# Patient Record
Sex: Female | Born: 1995 | Race: White | Hispanic: No | Marital: Single | Smoking: Never smoker
Health system: Southern US, Community
[De-identification: ages and names within clinical notes are randomized; demographics above are authoritative.]

## PROBLEM LIST (undated history)

## (undated) ENCOUNTER — Inpatient Hospital Stay (HOSPITAL_COMMUNITY): Payer: Self-pay

## (undated) DIAGNOSIS — J45909 Unspecified asthma, uncomplicated: Secondary | ICD-10-CM

## (undated) DIAGNOSIS — F419 Anxiety disorder, unspecified: Secondary | ICD-10-CM

## (undated) DIAGNOSIS — E039 Hypothyroidism, unspecified: Secondary | ICD-10-CM

## (undated) DIAGNOSIS — F32A Depression, unspecified: Secondary | ICD-10-CM

## (undated) DIAGNOSIS — F329 Major depressive disorder, single episode, unspecified: Secondary | ICD-10-CM

## (undated) DIAGNOSIS — F4310 Post-traumatic stress disorder, unspecified: Secondary | ICD-10-CM

## (undated) HISTORY — DX: Unspecified asthma, uncomplicated: J45.909

## (undated) HISTORY — PX: HIP SURGERY: SHX245

## (undated) HISTORY — DX: Anxiety disorder, unspecified: F41.9

## (undated) HISTORY — DX: Post-traumatic stress disorder, unspecified: F43.10

## (undated) HISTORY — PX: MOUTH SURGERY: SHX715

## (undated) HISTORY — PX: FEMUR SURGERY: SHX943

---
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
## 2015-06-09 ENCOUNTER — Emergency Department (HOSPITAL_COMMUNITY)
Admission: EM | Admit: 2015-06-09 | Discharge: 2015-06-09 | Disposition: A | Discharge status: Home / Self Care | Payer: Self-pay | Attending: Emergency Medicine | Admitting: Emergency Medicine

## 2015-06-09 ENCOUNTER — Encounter (HOSPITAL_COMMUNITY): Payer: Self-pay | Admitting: Emergency Medicine

## 2015-06-09 DIAGNOSIS — R1110 Vomiting, unspecified: Secondary | ICD-10-CM | POA: Insufficient documentation

## 2015-06-09 DIAGNOSIS — R51 Headache: Secondary | ICD-10-CM | POA: Insufficient documentation

## 2015-06-09 DIAGNOSIS — R109 Unspecified abdominal pain: Secondary | ICD-10-CM | POA: Insufficient documentation

## 2015-06-09 DIAGNOSIS — Z720 Tobacco use: Secondary | ICD-10-CM | POA: Insufficient documentation

## 2015-06-09 DIAGNOSIS — Z3202 Encounter for pregnancy test, result negative: Secondary | ICD-10-CM | POA: Insufficient documentation

## 2015-06-09 DIAGNOSIS — R519 Headache, unspecified: Secondary | ICD-10-CM

## 2015-06-09 LAB — COMPREHENSIVE METABOLIC PANEL
ALT: 14 U/L (ref 14–54)
ANION GAP: 6 (ref 5–15)
AST: 20 U/L (ref 15–41)
Albumin: 3.9 g/dL (ref 3.5–5.0)
Alkaline Phosphatase: 80 U/L (ref 38–126)
BILIRUBIN TOTAL: 0.5 mg/dL (ref 0.3–1.2)
BUN: 5 mg/dL — AB (ref 6–20)
CHLORIDE: 107 mmol/L (ref 101–111)
CO2: 22 mmol/L (ref 22–32)
Calcium: 9 mg/dL (ref 8.9–10.3)
Creatinine, Ser: 0.67 mg/dL (ref 0.44–1.00)
Glucose, Bld: 97 mg/dL (ref 65–99)
POTASSIUM: 4.1 mmol/L (ref 3.5–5.1)
Sodium: 135 mmol/L (ref 135–145)
TOTAL PROTEIN: 7.2 g/dL (ref 6.5–8.1)

## 2015-06-09 LAB — CBC
HEMATOCRIT: 40.3 % (ref 36.0–46.0)
Hemoglobin: 13.9 g/dL (ref 12.0–15.0)
MCH: 29.8 pg (ref 26.0–34.0)
MCHC: 34.5 g/dL (ref 30.0–36.0)
MCV: 86.3 fL (ref 78.0–100.0)
PLATELETS: 157 10*3/uL (ref 150–400)
RBC: 4.67 MIL/uL (ref 3.87–5.11)
RDW: 11.7 % (ref 11.5–15.5)
WBC: 7.1 10*3/uL (ref 4.0–10.5)

## 2015-06-09 LAB — URINALYSIS, ROUTINE W REFLEX MICROSCOPIC
BILIRUBIN URINE: NEGATIVE
Glucose, UA: NEGATIVE mg/dL
Hgb urine dipstick: NEGATIVE
KETONES UR: NEGATIVE mg/dL
LEUKOCYTES UA: NEGATIVE
NITRITE: NEGATIVE
Protein, ur: NEGATIVE mg/dL
SPECIFIC GRAVITY, URINE: 1.003 — AB (ref 1.005–1.030)
UROBILINOGEN UA: 0.2 mg/dL (ref 0.0–1.0)
pH: 6 (ref 5.0–8.0)

## 2015-06-09 LAB — I-STAT BETA HCG BLOOD, ED (MC, WL, AP ONLY)

## 2015-06-09 LAB — LIPASE, BLOOD: LIPASE: 25 U/L (ref 22–51)

## 2015-06-09 MED ORDER — IBUPROFEN 800 MG PO TABS: 800.0000 mg | ORAL_TABLET | Freq: Three times a day (TID) | ORAL | Status: DC | PRN

## 2015-06-09 MED ORDER — IBUPROFEN 800 MG PO TABS
800.0000 mg | ORAL_TABLET | Freq: Once | ORAL | Status: AC
  Administered 2015-06-09: 800 mg via ORAL
  Filled 2015-06-09: qty 1

## 2015-06-09 NOTE — Discharge Instructions (Signed)
Return here as needed. Your testing was normal. °

## 2015-06-09 NOTE — ED Provider Notes (Signed)
CSN: [id number]     Arrival date & time 06/09/15  1533 History   First MD Initiated Contact with Patient 06/09/15 1817     Chief Complaint  Patient presents with  . Dizziness  . Emesis  . Abdominal Pain     (Consider location/radiation/quality/duration/timing/severity/associated sxs/prior Treatment) HPI Patient presents to the emergency department with headache, dizziness and abdominal pain.  The patient states that she does not want [place] to investigate her abdominal pain, but do want [place] to look for a reason for her headache and dizziness.  Patient states she is concerned she may be pregnant, and that is why she is here today.  The patient states the symptoms started this morning.  The patient denies chest pain, shortness of breath, nausea, vomiting, diarrhea, hematemesis, bloody stool, dysuria, incontinence, hematuria, fever, neck pain, back pain, cough.  No sore throat or syncope.  The patient states that she did not take any medications prior to arrival for her symptoms History reviewed. No pertinent past medical history. Past Surgical History  Procedure Laterality Date  . Mouth surgery     No family history on file. Social History  Substance Use Topics  . Smoking status: Current Some Day Smoker  . Smokeless tobacco: None  . Alcohol Use: Yes     Comment: occ   OB History    No data available     Review of Systems   All other systems negative except as documented in the HPI. All pertinent positives and negatives as reviewed in the HPI. Allergies  Review of patient's allergies indicates no known allergies.  Home Medications   Prior to Admission medications   Not on File   BP 106/64 mmHg  Pulse 110  Temp(Src) 99 F (37.2 C) (Oral)  Resp 20  SpO2 97%  LMP 05/18/2015 Physical Exam  Constitutional: She is oriented to person, place, and time. She appears well-developed and well-nourished. No distress.  HENT:  Head: Normocephalic and atraumatic.  Mouth/Throat:  Oropharynx is clear and moist.  Eyes: Pupils are equal, round, and reactive to light.  Neck: Normal range of motion. Neck supple.  Cardiovascular: Normal rate, regular rhythm and normal heart sounds.  Exam reveals no gallop and no friction rub.   No murmur heard. Pulmonary/Chest: Effort normal and breath sounds normal. No respiratory distress.  Abdominal: Normal appearance and bowel sounds are normal. There is no rigidity, no rebound, no guarding and no CVA tenderness. No hernia.  Neurological: She is alert and oriented to person, place, and time. She has normal strength. No cranial nerve deficit or sensory deficit. She exhibits normal muscle tone. Coordination and gait normal. GCS eye subscore is 4. GCS verbal subscore is 5. GCS motor subscore is 6.  Skin: Skin is warm and dry. No rash noted. No erythema.  Psychiatric: She has a normal mood and affect. Her behavior is normal.    ED Course  Procedures (including critical care time) Labs Review Labs Reviewed  COMPREHENSIVE METABOLIC PANEL - Abnormal; Notable for the following:    BUN 5 (*)    All other components within normal limits  URINALYSIS, ROUTINE W REFLEX MICROSCOPIC (NOT AT [facility]) - Abnormal; Notable for the following:    Specific Gravity, Urine 1.003 (*)    All other components within normal limits  LIPASE, BLOOD  CBC  I-STAT BETA HCG BLOOD, ED ([facility], [facility], [facility] ONLY)    Imaging Review No results found. I have personally reviewed and evaluated these images and lab results as  part of my medical decision-making.  Patient refused CT scan of her abdomen even though she is complaining of abdominal pain.  She did have pain on exam, but refused the testing she is one of the laceration had a headache and dizziness and she was worried about being pregnant.  Patient is advised return here as needed.  Told to increase her fluid intake, rest as much as possible   Charlestine Night, PA-C 06/11/15 0148  Eber Hong, MD 06/12/15 [phone]

## 2015-06-09 NOTE — ED Notes (Signed)
Pt c/o abdominal pain with N/v and dizziness onset last night. Pt also reports that she feels hot.

## 2015-06-09 NOTE — ED Notes (Signed)
Pt refused IV access and would not like to have her CT abdomen. Pt states, "I don't want that I don't need that." Advised of need for IV for Contrasted CT. Pt states, "I'm not concerned about my belly pain. I just wanted to know why I have a headache and dizziness. I don't care about my belly. It's fine."  PA Lawyer made aware.

## 2016-01-26 DIAGNOSIS — F410 Panic disorder [episodic paroxysmal anxiety] without agoraphobia: Secondary | ICD-10-CM | POA: Insufficient documentation

## 2016-05-19 LAB — OB RESULTS CONSOLE ANTIBODY SCREEN: Antibody Screen: NEGATIVE

## 2016-05-19 LAB — OB RESULTS CONSOLE GC/CHLAMYDIA
Chlamydia: NEGATIVE
GC PROBE AMP, GENITAL: NEGATIVE

## 2016-05-19 LAB — OB RESULTS CONSOLE HEPATITIS B SURFACE ANTIGEN: HEP B S AG: NEGATIVE

## 2016-05-19 LAB — OB RESULTS CONSOLE ABO/RH: RH Type: POSITIVE

## 2016-05-19 LAB — OB RESULTS CONSOLE RPR: RPR: NONREACTIVE

## 2016-05-19 LAB — OB RESULTS CONSOLE RUBELLA ANTIBODY, IGM: RUBELLA: IMMUNE

## 2016-05-19 LAB — OB RESULTS CONSOLE HIV ANTIBODY (ROUTINE TESTING): HIV: NONREACTIVE

## 2016-05-21 ENCOUNTER — Other Ambulatory Visit (HOSPITAL_COMMUNITY): Payer: Self-pay | Admitting: Nurse Practitioner

## 2016-05-21 DIAGNOSIS — Z3A23 23 weeks gestation of pregnancy: Secondary | ICD-10-CM

## 2016-05-21 DIAGNOSIS — Z3689 Encounter for other specified antenatal screening: Secondary | ICD-10-CM

## 2016-05-21 DIAGNOSIS — O09891 Supervision of other high risk pregnancies, first trimester: Secondary | ICD-10-CM

## 2016-05-27 ENCOUNTER — Encounter (HOSPITAL_COMMUNITY): Payer: Self-pay | Admitting: Nurse Practitioner

## 2016-06-02 ENCOUNTER — Encounter (HOSPITAL_COMMUNITY): Payer: Self-pay | Admitting: *Deleted

## 2016-06-03 ENCOUNTER — Ambulatory Visit (HOSPITAL_COMMUNITY)
Admission: RE | Admit: 2016-06-03 | Discharge: 2016-06-03 | Disposition: A | Discharge status: Home / Self Care | Payer: Medicaid Other | Source: Ambulatory Visit | Attending: Nurse Practitioner | Admitting: Nurse Practitioner

## 2016-06-03 ENCOUNTER — Encounter (HOSPITAL_COMMUNITY): Payer: Self-pay

## 2016-06-03 DIAGNOSIS — Z36 Encounter for antenatal screening of mother: Secondary | ICD-10-CM | POA: Diagnosis not present

## 2016-06-03 DIAGNOSIS — Z87891 Personal history of nicotine dependence: Secondary | ICD-10-CM | POA: Diagnosis not present

## 2016-06-03 DIAGNOSIS — O09891 Supervision of other high risk pregnancies, first trimester: Secondary | ICD-10-CM

## 2016-06-03 DIAGNOSIS — Z3A23 23 weeks gestation of pregnancy: Secondary | ICD-10-CM | POA: Diagnosis not present

## 2016-06-03 DIAGNOSIS — O09892 Supervision of other high risk pregnancies, second trimester: Secondary | ICD-10-CM | POA: Insufficient documentation

## 2016-06-03 DIAGNOSIS — Z3689 Encounter for other specified antenatal screening: Secondary | ICD-10-CM
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
## 2016-07-10 ENCOUNTER — Inpatient Hospital Stay (HOSPITAL_COMMUNITY)
Admission: AD | Admit: 2016-07-10 | Discharge: 2016-07-10 | Disposition: A | Discharge status: Home / Self Care | Payer: Self-pay | Source: Ambulatory Visit | Attending: Obstetrics & Gynecology | Admitting: Obstetrics & Gynecology

## 2016-07-10 ENCOUNTER — Encounter (HOSPITAL_COMMUNITY): Payer: Self-pay | Admitting: Certified Nurse Midwife

## 2016-07-10 DIAGNOSIS — Z87891 Personal history of nicotine dependence: Secondary | ICD-10-CM | POA: Insufficient documentation

## 2016-07-10 DIAGNOSIS — Z3A28 28 weeks gestation of pregnancy: Secondary | ICD-10-CM | POA: Insufficient documentation

## 2016-07-10 DIAGNOSIS — O368130 Decreased fetal movements, third trimester, not applicable or unspecified: Secondary | ICD-10-CM | POA: Insufficient documentation

## 2016-07-10 MED ORDER — LIDOCAINE VISCOUS 2 % MT SOLN
5.0000 mL | OROMUCOSAL | 0 refills | Status: DC | PRN
Start: 2016-07-10 — End: 2018-01-06

## 2016-07-10 MED ORDER — LIDOCAINE VISCOUS 2 % MT SOLN: 5.0000 mL | OROMUCOSAL | 0 refills | Status: DC | PRN

## 2016-07-10 NOTE — Discharge Instructions (Signed)
Fetal Movement Counts  Patient Name: __________________________________________________ Patient Due Date: ____________________  Performing a fetal movement count is highly recommended in high-risk pregnancies, but it is good for every pregnant woman to do. Your health care provider may ask you to start counting fetal movements at 28 weeks of the pregnancy. Fetal movements often increase:  · After eating a full meal.  · After physical activity.  · After eating or drinking something sweet or cold.  · At rest.  Pay attention to when you feel the baby is most active. This will help you notice a pattern of your baby's sleep and wake cycles and what factors contribute to an increase in fetal movement. It is important to perform a fetal movement count at the same time each day when your baby is normally most active.   HOW TO COUNT FETAL MOVEMENTS  1. Find a quiet and comfortable area to sit or lie down on your left side. Lying on your left side provides the best blood and oxygen circulation to your baby.  2. Write down the day and time on a sheet of paper or in a journal.  3. Start counting kicks, flutters, swishes, rolls, or jabs in a 2-hour period. You should feel at least 10 movements within 2 hours.  4. If you do not feel 10 movements in 2 hours, wait 2-3 hours and count again. Look for a change in the pattern or not enough counts in 2 hours.  SEEK MEDICAL CARE IF:  · You feel less than 10 counts in 2 hours, tried twice.  · There is no movement in over an hour.  · The pattern is changing or taking longer each day to reach 10 counts in 2 hours.  · You feel the baby is not moving as he or she usually does.  Date: ____________ Movements: ____________ Start time: ____________ Finish time: ____________   Date: ____________ Movements: ____________ Start time: ____________ Finish time: ____________  Date: ____________ Movements: ____________ Start time: ____________ Finish time: ____________  Date: ____________ Movements:  ____________ Start time: ____________ Finish time: ____________  Date: ____________ Movements: ____________ Start time: ____________ Finish time: ____________  Date: ____________ Movements: ____________ Start time: ____________ Finish time: ____________  Date: ____________ Movements: ____________ Start time: ____________ Finish time: ____________  Date: ____________ Movements: ____________ Start time: ____________ Finish time: ____________   Date: ____________ Movements: ____________ Start time: ____________ Finish time: ____________  Date: ____________ Movements: ____________ Start time: ____________ Finish time: ____________  Date: ____________ Movements: ____________ Start time: ____________ Finish time: ____________  Date: ____________ Movements: ____________ Start time: ____________ Finish time: ____________  Date: ____________ Movements: ____________ Start time: ____________ Finish time: ____________  Date: ____________ Movements: ____________ Start time: ____________ Finish time: ____________  Date: ____________ Movements: ____________ Start time: ____________ Finish time: ____________   Date: ____________ Movements: ____________ Start time: ____________ Finish time: ____________  Date: ____________ Movements: ____________ Start time: ____________ Finish time: ____________  Date: ____________ Movements: ____________ Start time: ____________ Finish time: ____________  Date: ____________ Movements: ____________ Start time: ____________ Finish time: ____________  Date: ____________ Movements: ____________ Start time: ____________ Finish time: ____________  Date: ____________ Movements: ____________ Start time: ____________ Finish time: ____________  Date: ____________ Movements: ____________ Start time: ____________ Finish time: ____________   Date: ____________ Movements: ____________ Start time: ____________ Finish time: ____________  Date: ____________ Movements: ____________ Start time: ____________ Finish  time: ____________  Date: ____________ Movements: ____________ Start time: ____________ Finish time: ____________  Date: ____________ Movements: ____________ Start time:   ____________ Finish time: ____________  Date: ____________ Movements: ____________ Start time: ____________ Finish time: ____________  Date: ____________ Movements: ____________ Start time: ____________ Finish time: ____________  Date: ____________ Movements: ____________ Start time: ____________ Finish time: ____________   Date: ____________ Movements: ____________ Start time: ____________ Finish time: ____________  Date: ____________ Movements: ____________ Start time: ____________ Finish time: ____________  Date: ____________ Movements: ____________ Start time: ____________ Finish time: ____________  Date: ____________ Movements: ____________ Start time: ____________ Finish time: ____________  Date: ____________ Movements: ____________ Start time: ____________ Finish time: ____________  Date: ____________ Movements: ____________ Start time: ____________ Finish time: ____________  Date: ____________ Movements: ____________ Start time: ____________ Finish time: ____________   Date: ____________ Movements: ____________ Start time: ____________ Finish time: ____________  Date: ____________ Movements: ____________ Start time: ____________ Finish time: ____________  Date: ____________ Movements: ____________ Start time: ____________ Finish time: ____________  Date: ____________ Movements: ____________ Start time: ____________ Finish time: ____________  Date: ____________ Movements: ____________ Start time: ____________ Finish time: ____________  Date: ____________ Movements: ____________ Start time: ____________ Finish time: ____________  Date: ____________ Movements: ____________ Start time: ____________ Finish time: ____________   Date: ____________ Movements: ____________ Start time: ____________ Finish time: ____________  Date: ____________  Movements: ____________ Start time: ____________ Finish time: ____________  Date: ____________ Movements: ____________ Start time: ____________ Finish time: ____________  Date: ____________ Movements: ____________ Start time: ____________ Finish time: ____________  Date: ____________ Movements: ____________ Start time: ____________ Finish time: ____________  Date: ____________ Movements: ____________ Start time: ____________ Finish time: ____________  Date: ____________ Movements: ____________ Start time: ____________ Finish time: ____________   Date: ____________ Movements: ____________ Start time: ____________ Finish time: ____________  Date: ____________ Movements: ____________ Start time: ____________ Finish time: ____________  Date: ____________ Movements: ____________ Start time: ____________ Finish time: ____________  Date: ____________ Movements: ____________ Start time: ____________ Finish time: ____________  Date: ____________ Movements: ____________ Start time: ____________ Finish time: ____________  Date: ____________ Movements: ____________ Start time: ____________ Finish time: ____________     This information is not intended to replace advice given to you by your health care provider. Make sure you discuss any questions you have with your health care provider.     Document Released: 10/08/2006 Document Revised: 09/29/2014 Document Reviewed: 07/05/2012  Elsevier Interactive Patient Education ©2016 [employer]

## 2016-07-10 NOTE — MAU Provider Note (Signed)
None     Chief Complaint:  Decreased Fetal Movement and Shortness of Breath   Ashlee RifeBrittany Turpin is  20 y.o. G1P0 at 1623w3d presents complaining of Decreased Fetal Movement and Shortness of Breath No fetal movement since yesterday. Feeling baby move now. Has had a sore throat since yesterday as well.  Feels like her sore throat makes it hard to breathe.  Obstetrical/Gynecological History: OB History    Gravida Para Term Preterm AB Living   1             SAB TAB Ectopic Multiple Live Births                 Past Medical History: Past Medical History:  Diagnosis Date  . Medical history non-contributory     Past Surgical History: Past Surgical History:  Procedure Laterality Date  . FEMUR SURGERY    . HIP SURGERY    . MOUTH SURGERY      Family History: History reviewed. No pertinent family history.  Social History: Social History  Substance Use Topics  . Smoking status: Former Games developermoker  . Smokeless tobacco: Never Used  . Alcohol use No     Comment: occ    Allergies: No Known Allergies  Meds:  Prescriptions Prior to Admission  Medication Sig Dispense Refill Last Dose  . diphenhydrAMINE (BENADRYL) 25 MG tablet Take 50 mg by mouth every 6 (six) hours as needed for allergies.   07/10/2016 at Unknown time  . hydrOXYzine (VISTARIL) 25 MG capsule Take 50 mg by mouth every 6 (six) hours as needed for anxiety.    Past Week at Unknown time  . Prenatal MV-Min-FA-Omega-3 (PRENATAL GUMMIES/DHA & FA) 0.4-32.5 MG CHEW Chew 2 each by mouth daily.   07/10/2016 at Unknown time    Review of Systems   Constitutional: Negative for fever and chills Eyes: Negative for visual disturbances Respiratory: Negative for dyspnea Cardiovascular: Negative for chest pain or palpitations  Gastrointestinal: Negative for vomiting, diarrhea and constipation Genitourinary: Negative for dysuria and urgency Musculoskeletal: Negative for back pain, joint pain, myalgias.  Normal ROM  Neurological: Negative  for dizziness and headaches    Physical Exam  Blood pressure 115/71, pulse 87, temperature 98.6 F (37 C), temperature source Oral, resp. rate 18, last menstrual period 12/24/2015. GENERAL: Well-developed, well-nourished female in no acute distress.  LUNGS: Clear to auscultation bilaterally.  HEART: Regular rate and rhythm. ABDOMEN: Soft, nontender, nondistended, gravid.  EXTREMITIES: Nontender, no edema, 2+ distal pulses. DTR's 2+ FHT:  Baseline rate 145 bpm   Variability moderate  Accelerations present   Decelerations none Contractions: Every 0 mins   Labs: No results found for this or any previous visit (from the past 24 hour(s)). Imaging Studies:  No results found.  Assessment: Ashlee Simmons is  20 y.o. G1P0 at 4923w3d presents with decreased fetal movement, resolved. Sore throat.  Plan: Gargle w/salt water, rx viscous lidocaine prn Kick counts Antibiotic guidelines discussed  CRESENZO-DISHMAN,Juwuan Sedita 10/19/20179:29 PM

## 2016-07-10 NOTE — MAU Note (Signed)
Pt states she hasn't felt the baby move in 24 hours. Pt arrives via EMS. Pt denies ctxs, LOF, or vaginal bleeding. Pt states she has had a very sore throat since yesterday.

## 2016-07-15 ENCOUNTER — Encounter (HOSPITAL_COMMUNITY): Payer: Self-pay

## 2016-07-15 ENCOUNTER — Ambulatory Visit (HOSPITAL_COMMUNITY)
Admission: RE | Admit: 2016-07-15 | Discharge: 2016-07-15 | Disposition: A | Discharge status: Home / Self Care | Payer: Self-pay | Source: Ambulatory Visit | Attending: Nurse Practitioner | Admitting: Nurse Practitioner

## 2016-07-15 ENCOUNTER — Other Ambulatory Visit (HOSPITAL_COMMUNITY): Payer: Self-pay | Admitting: *Deleted

## 2016-07-15 ENCOUNTER — Other Ambulatory Visit (HOSPITAL_COMMUNITY): Payer: Self-pay | Admitting: Maternal and Fetal Medicine

## 2016-07-15 DIAGNOSIS — Z3689 Encounter for other specified antenatal screening: Secondary | ICD-10-CM

## 2016-07-15 DIAGNOSIS — O99322 Drug use complicating pregnancy, second trimester: Secondary | ICD-10-CM | POA: Insufficient documentation

## 2016-07-15 DIAGNOSIS — Z3A27 27 weeks gestation of pregnancy: Secondary | ICD-10-CM

## 2016-07-15 DIAGNOSIS — Z362 Encounter for other antenatal screening follow-up: Secondary | ICD-10-CM | POA: Insufficient documentation

## 2016-07-15 NOTE — Addendum Note (Signed)
Encounter addended by: Henreitta LeberAlyssa J Murphy, RDMS on: 07/15/2016  3:34 PM<BR>    Actions taken: Imaging Exam ended

## 2016-08-26 ENCOUNTER — Other Ambulatory Visit (HOSPITAL_COMMUNITY): Payer: Self-pay | Admitting: Obstetrics and Gynecology

## 2016-08-26 ENCOUNTER — Ambulatory Visit (HOSPITAL_COMMUNITY)
Admission: RE | Admit: 2016-08-26 | Discharge: 2016-08-26 | Disposition: A | Discharge status: Home / Self Care | Payer: Medicaid Other | Source: Ambulatory Visit | Attending: Nurse Practitioner | Admitting: Nurse Practitioner

## 2016-08-26 ENCOUNTER — Encounter (HOSPITAL_COMMUNITY): Payer: Self-pay

## 2016-08-26 DIAGNOSIS — Z3689 Encounter for other specified antenatal screening: Secondary | ICD-10-CM

## 2016-08-26 DIAGNOSIS — O99323 Drug use complicating pregnancy, third trimester: Secondary | ICD-10-CM | POA: Diagnosis present

## 2016-08-26 DIAGNOSIS — O99322 Drug use complicating pregnancy, second trimester: Secondary | ICD-10-CM

## 2016-08-26 DIAGNOSIS — Z3A33 33 weeks gestation of pregnancy: Secondary | ICD-10-CM | POA: Insufficient documentation

## 2016-08-29 ENCOUNTER — Encounter (HOSPITAL_COMMUNITY): Payer: Self-pay | Admitting: *Deleted

## 2016-08-29 ENCOUNTER — Inpatient Hospital Stay (HOSPITAL_COMMUNITY)
Admission: AD | Admit: 2016-08-29 | Discharge: 2016-08-30 | Disposition: A | Discharge status: Home / Self Care | Payer: Medicaid Other | Source: Ambulatory Visit | Attending: Obstetrics & Gynecology | Admitting: Obstetrics & Gynecology

## 2016-08-29 DIAGNOSIS — O26893 Other specified pregnancy related conditions, third trimester: Secondary | ICD-10-CM | POA: Insufficient documentation

## 2016-08-29 DIAGNOSIS — R0602 Shortness of breath: Secondary | ICD-10-CM | POA: Insufficient documentation

## 2016-08-29 DIAGNOSIS — Z87891 Personal history of nicotine dependence: Secondary | ICD-10-CM | POA: Insufficient documentation

## 2016-08-29 DIAGNOSIS — Z3A33 33 weeks gestation of pregnancy: Secondary | ICD-10-CM | POA: Insufficient documentation

## 2016-08-29 LAB — URINALYSIS, ROUTINE W REFLEX MICROSCOPIC
BILIRUBIN URINE: NEGATIVE
Glucose, UA: NEGATIVE mg/dL
Hgb urine dipstick: NEGATIVE
KETONES UR: NEGATIVE mg/dL
Nitrite: NEGATIVE
Protein, ur: NEGATIVE mg/dL
Specific Gravity, Urine: 1.004 — ABNORMAL LOW (ref 1.005–1.030)
pH: 7 (ref 5.0–8.0)

## 2016-08-29 NOTE — MAU Note (Signed)
Urine sent to the lab

## 2016-08-29 NOTE — MAU Note (Signed)
About 1800 laid back and felt SOB. Layed on side and did not help and walked around some and still felt that way. Cont to have some SOB. Abd tightening that comes and goes. Baby breech last ck.

## 2016-08-30 DIAGNOSIS — R0602 Shortness of breath: Secondary | ICD-10-CM | POA: Diagnosis present

## 2016-08-30 DIAGNOSIS — O26893 Other specified pregnancy related conditions, third trimester: Secondary | ICD-10-CM | POA: Diagnosis not present

## 2016-08-30 DIAGNOSIS — Z3A33 33 weeks gestation of pregnancy: Secondary | ICD-10-CM | POA: Diagnosis not present

## 2016-08-30 DIAGNOSIS — O4703 False labor before 37 completed weeks of gestation, third trimester: Secondary | ICD-10-CM

## 2016-08-30 DIAGNOSIS — Z87891 Personal history of nicotine dependence: Secondary | ICD-10-CM | POA: Diagnosis not present

## 2016-08-30 LAB — WET PREP, GENITAL
CLUE CELLS WET PREP: NONE SEEN
Sperm: NONE SEEN
TRICH WET PREP: NONE SEEN
YEAST WET PREP: NONE SEEN

## 2016-08-30 MED ORDER — GI COCKTAIL ~~LOC~~
30.0000 mL | Freq: Once | ORAL | Status: AC
  Administered 2016-08-30: 30 mL via ORAL
  Filled 2016-08-30: qty 30

## 2016-08-30 NOTE — Progress Notes (Signed)
Written and verbal d/c instructions given by Remigio EisenmengerBenji Stanley RN and understanding voiced.

## 2016-08-30 NOTE — MAU Note (Signed)
Pt took one swallow of GI cocktail and stated did not think she could swallow anymore of the medicine. States feels better from the one swallow but lips alittle numb. Made aware before taking med that numbness of lips, mouth, throat could happen due to xylocaine.

## 2016-08-30 NOTE — Progress Notes (Signed)
Freddie ApleyKathrine Kooistra CNM on unit and aware of pt's admission and assessment

## 2016-08-30 NOTE — Discharge Instructions (Signed)
Abdominal Pain During Pregnancy Abdominal pain is common in pregnancy. Most of the time, it does not cause harm. There are many causes of abdominal pain. Some causes are more serious than others and sometimes the cause is not known. Abdominal pain can be a sign that something is very wrong with the pregnancy or the pain may have nothing to do with the pregnancy. Always tell your health care provider if you have any abdominal pain. Follow these instructions at home:  Do not have sex or put anything in your vagina until your symptoms go away completely.  Watch your abdominal pain for any changes.  Get plenty of rest until your pain improves.  Drink enough fluid to keep your urine clear or pale yellow.  Take over-the-counter or prescription medicines only as told by your health care provider.  Keep all follow-up visits as told by your health care provider. This is important. Contact a health care provider if:  You have a fever.  Your pain gets worse or you have cramping.  Your pain continues after resting. Get help right away if:  You are bleeding, leaking fluid, or passing tissue from the vagina.  You have vomiting or diarrhea that does not go away.  You have painful or bloody urination.  You notice a decrease in your baby's movements.  You feel very weak or faint.  You have shortness of breath.  You develop a severe headache with abdominal pain.  You have abnormal vaginal discharge with abdominal pain. This information is not intended to replace advice given to you by your health care provider. Make sure you discuss any questions you have with your health care provider. Document Released: 09/08/2005 Document Revised: 06/19/2016 Document Reviewed: 04/07/2013 Elsevier Interactive Patient Education  2017 [employer] Inc. Gastroesophageal Reflux Scan A gastroesophageal reflux scan is a procedure that is used to check for gastroesophageal reflux, which is the backward flow of  stomach contents into the tube that carries food from the mouth to the stomach (esophagus). The scan can also show if any stomach contents are inhaled (aspirated) into your lungs. You may need this scan if you have symptoms such as heartburn, vomiting, swallowing problems, or regurgitation. Regurgitation means that swallowed food is returning from the stomach to the esophagus. For this scan, you will drink a liquid that contains a small amount of a radioactive substance (tracer). A scanner with a camera that detects the radioactive tracer is used to see if any of the material backs up into your esophagus. Tell a health care provider about:  Any allergies you have.  All medicines you are taking, including vitamins, herbs, eye drops, creams, and over-the-counter medicines.  Any blood disorders you have.  Any surgeries you have had.  Any medical conditions you have.  If you are pregnant or you think that you may be pregnant.  If you are breastfeeding. What are the risks? Generally, this is a safe procedure. However, problems may occur, including:  Exposure to radiation (a small amount).  Allergic reaction to the radioactive substance. This is rare. What happens before the procedure?  Ask your health care provider about changing or stopping your regular medicines. This is especially important if you are taking diabetes medicines or blood thinners.  Follow your health care providers instructions about eating or drinking restrictions. What happens during the procedure?  You will be asked to drink a liquid that contains a small amount of a radioactive tracer. This liquid will probably be similar to orange juice.  You will assume a position lying on your back.  A series of images will be taken of your esophagus and upper stomach.  You may be asked to move into different positions to help determine if reflux occurs more often when you are in specific positions.  For adults, an abdominal  binder with an inflatable cuff may be placed on the belly (abdomen). This may be used to increase abdominal pressure. More images will be taken to see if the increased pressure causes reflux to occur. The procedure may vary among health care providers and hospitals. What happens after the procedure?  Return to your normal activities and your normal diet as directed by your health care provider.  The radioactive tracer will leave your body over the next few days. Drink enough fluid to keep your urine clear or pale yellow. This will help to flush the tracer out of your body.  It is your responsibility to obtain your test results. Ask your health care provider or the department performing the test when and how you will get your results. This information is not intended to replace advice given to you by your health care provider. Make sure you discuss any questions you have with your health care provider. Document Released: 10/30/2005 Document Revised: 06/02/2016 Document Reviewed: 06/20/2014 Elsevier Interactive Patient Education  2017 [employer]

## 2016-08-30 NOTE — MAU Provider Note (Signed)
History   Patient Ashlee Simmons is a 20 year old G1P0 at 233 weeks here with complaitns of shortness of breath starting at 6 30 pm when she laid back.  She also felt some tightening. She feels anxious bc the baby was breech and wants to make sure everything is ok.  Patient feels pain on her right hand side where she says the baby is "all bunched up". Patient has had no nausea and vomiting, no vaginal disscharge, no bleeding and reports positive fetal movements. Her diet today was normal: [employer] and hamburger helper for dinner.   CSN: [id number]  Arrival date and time: 08/29/16 2252   None     Chief Complaint  Patient presents with  . Contractions  . Shortness of Breath   HPI  OB History    Gravida Para Term Preterm AB Living   1             SAB TAB Ectopic Multiple Live Births                  Past Medical History:  Diagnosis Date  . Medical history non-contributory     Past Surgical History:  Procedure Laterality Date  . FEMUR SURGERY    . HIP SURGERY    . MOUTH SURGERY      History reviewed. No pertinent family history.  Social History  Substance Use Topics  . Smoking status: Former Games developermoker  . Smokeless tobacco: Never Used  . Alcohol use No     Comment: occ    Allergies: No Known Allergies  Prescriptions Prior to Admission  Medication Sig Dispense Refill Last Dose  . acetaminophen (TYLENOL) 500 MG tablet Take 1,000 mg by mouth every 6 (six) hours as needed.   08/28/2016 at Unknown time  . calcium carbonate (TUMS - DOSED IN MG ELEMENTAL CALCIUM) 500 MG chewable tablet Chew 2 tablets by mouth daily.   08/29/2016 at Unknown time  . diphenhydrAMINE (BENADRYL) 25 MG tablet Take 50 mg by mouth every 6 (six) hours as needed for allergies.   08/29/2016 at Unknown time  . Prenatal MV-Min-FA-Omega-3 (PRENATAL GUMMIES/DHA & FA) 0.4-32.5 MG CHEW Chew 2 each by mouth daily.   08/29/2016 at Unknown time  . hydrOXYzine (VISTARIL) 25 MG capsule Take 50 mg by mouth every 6 (six)  hours as needed for anxiety.    Not Taking  . lidocaine (XYLOCAINE) 2 % solution Use as directed 5 mLs in the mouth or throat every 3 (three) hours as needed for mouth pain. (Patient not taking: Reported on 08/26/2016) 100 mL 0 Not Taking    Review of Systems  Constitutional: Negative.   HENT: Negative.   Eyes: Negative.   Respiratory: Negative.   Cardiovascular: Negative.  Negative for chest pain.  Gastrointestinal: Positive for abdominal pain and heartburn.  Genitourinary: Negative.   Musculoskeletal: Negative.   Skin: Negative.   Neurological: Negative.   Endo/Heme/Allergies: Negative.    Physical Exam   Blood pressure (!) 98/52, pulse 77, temperature 98.2 F (36.8 C), resp. rate 18, height 5\' 3"  (1.6 m), weight 178 lb 6.4 oz (80.9 kg), last menstrual period 12/24/2015, SpO2 100 %.  Physical Exam  Constitutional: She is oriented to person, place, and time. She appears well-developed and well-nourished.  Neck: Normal range of motion.  Respiratory: Effort normal and breath sounds normal. No respiratory distress. She has no wheezes. She has no rales. She exhibits no tenderness.  GI: Soft. She exhibits no distension and no mass.  There is no tenderness. There is no rebound and no guarding.  Genitourinary: Vagina normal. No vaginal discharge found.  Neurological: She is alert and oriented to person, place, and time.  Skin: Skin is warm and dry.  Psychiatric: She has a normal mood and affect.  Cervical exam deferred.   MAU Course  Procedures  MDM -GI cocktail -wet prep pending and GC cultures pending -FHR is 150 bpm, present accelerations and moderate variability, no decelerations.  Assessment and Plan  Lung fields are clear, patient is alert and oriented times 3 in bed; she was sleeping and is now awake and asking many questions about back pain and labor and about water birth.  Vital signs are stable; she feels much better after having a small dose of GI cocktail.  Given that  patient has been resting comfortably with no distress and that her heart burn was relieved with small dose of GI cocktail, patient is stable for discharge with instructions to keep her next prenatal appointment.   Charlesetta GaribaldiKathryn Lorraine Kooistra CNM 08/30/2016, 2:44 AM

## 2016-09-01 LAB — GC/CHLAMYDIA PROBE AMP (~~LOC~~) NOT AT ARMC
CHLAMYDIA, DNA PROBE: NEGATIVE
NEISSERIA GONORRHEA: NEGATIVE

## 2016-09-09 LAB — OB RESULTS CONSOLE GBS: GBS: NEGATIVE

## 2016-09-09 LAB — OB RESULTS CONSOLE GC/CHLAMYDIA: GC PROBE AMP, GENITAL: NEGATIVE

## 2016-09-22 ENCOUNTER — Encounter (HOSPITAL_COMMUNITY): Payer: Self-pay | Admitting: *Deleted

## 2016-09-22 ENCOUNTER — Inpatient Hospital Stay (HOSPITAL_COMMUNITY)
Admission: AD | Admit: 2016-09-22 | Discharge: 2016-09-22 | Disposition: A | Discharge status: Home / Self Care | Payer: Medicaid Other | Source: Ambulatory Visit | Attending: Obstetrics and Gynecology | Admitting: Obstetrics and Gynecology

## 2016-09-22 DIAGNOSIS — Z3493 Encounter for supervision of normal pregnancy, unspecified, third trimester: Secondary | ICD-10-CM | POA: Insufficient documentation

## 2016-09-22 NOTE — Discharge Instructions (Signed)
Introduction Patient Name: ________________________________________________ Patient Due Date: ____________________ What is a fetal movement count? A fetal movement count is the number of times that you feel your baby move during a certain amount of time. This may also be called a fetal kick count. A fetal movement count is recommended for every pregnant woman. You may be asked to start counting fetal movements as early as week 28 of your pregnancy. Pay attention to when your baby is most active. You may notice your baby's sleep and wake cycles. You may also notice things that make your baby move more. You should do a fetal movement count:  When your baby is normally most active.  At the same time each day. A good time to count movements is while you are resting, after having something to eat and drink. How do I count fetal movements? 1. Find a quiet, comfortable area. Sit, or lie down on your side. 2. Write down the date, the start time and stop time, and the number of movements that you felt between those two times. Take this information with you to your health care visits. 3. For 2 hours, count kicks, flutters, swishes, rolls, and jabs. You should feel at least 10 movements during 2 hours. 4. You may stop counting after you have felt 10 movements. 5. If you do not feel 10 movements in 2 hours, have something to eat and drink. Then, keep resting and counting for 1 hour. If you feel at least 4 movements during that hour, you may stop counting. Contact a health care provider if:  You feel fewer than 4 movements in 2 hours.  Your baby is not moving like he or she usually does. Date: ____________ Start time: ____________ Stop time: ____________ Movements: ____________ Date: ____________ Start time: ____________ Stop time: ____________ Movements: ____________ Date: ____________ Start time: ____________ Stop time: ____________ Movements: ____________ Date: ____________ Start time: ____________  Stop time: ____________ Movements: ____________ Date: ____________ Start time: ____________ Stop time: ____________ Movements: ____________ Date: ____________ Start time: ____________ Stop time: ____________ Movements: ____________ Date: ____________ Start time: ____________ Stop time: ____________ Movements: ____________ Date: ____________ Start time: ____________ Stop time: ____________ Movements: ____________ Date: ____________ Start time: ____________ Stop time: ____________ Movements: ____________ This information is not intended to replace advice given to you by your health care provider. Make sure you discuss any questions you have with your health care provider. Document Released: 10/08/2006 Document Revised: 05/07/2016 Document Reviewed: 10/18/2015 Elsevier Interactive Patient Education  2017 [employer] Inc. Braxton Hicks Contractions Contractions of the uterus can occur throughout pregnancy. Contractions are not always a sign that you are in labor.  WHAT ARE BRAXTON HICKS CONTRACTIONS?  Contractions that occur before labor are called Braxton Hicks contractions, or false labor. Toward the end of pregnancy (32-34 weeks), these contractions can develop more often and may become more forceful. This is not true labor because these contractions do not result in opening (dilatation) and thinning of the cervix. They are sometimes difficult to tell apart from true labor because these contractions can be forceful and people have different pain tolerances. You should not feel embarrassed if you go to the hospital with false labor. Sometimes, the only way to tell if you are in true labor is for your health care provider to look for changes in the cervix. If there are no prenatal problems or other health problems associated with the pregnancy, it is completely safe to be sent home with false labor and await the onset of true labor.   HOW CAN YOU TELL THE DIFFERENCE BETWEEN TRUE AND FALSE LABOR? False Labor     The contractions of false labor are usually shorter and not as hard as those of true labor.   The contractions are usually irregular.   The contractions are often felt in the front of the lower abdomen and in the groin.   The contractions may go away when you walk around or change positions while lying down.   The contractions get weaker and are shorter lasting as time goes on.   The contractions do not usually become progressively stronger, regular, and closer together as with true labor.  True Labor   Contractions in true labor last 30-70 seconds, become very regular, usually become more intense, and increase in frequency.   The contractions do not go away with walking.   The discomfort is usually felt in the top of the uterus and spreads to the lower abdomen and low back.   True labor can be determined by your health care provider with an exam. This will show that the cervix is dilating and getting thinner.  WHAT TO REMEMBER  Keep up with your usual exercises and follow other instructions given by your health care provider.   Take medicines as directed by your health care provider.   Keep your regular prenatal appointments.   Eat and drink lightly if you think you are going into labor.   If Braxton Hicks contractions are making you uncomfortable:   Change your position from lying down or resting to walking, or from walking to resting.   Sit and rest in a tub of warm water.   Drink 2-3 glasses of water. Dehydration may cause these contractions.   Do slow and deep breathing several times an hour.  WHEN SHOULD I SEEK IMMEDIATE MEDICAL CARE? Seek immediate medical care if:  Your contractions become stronger, more regular, and closer together.   You have fluid leaking or gushing from your vagina.   You have a fever.   You pass blood-tinged mucus.   You have vaginal bleeding.   You have continuous abdominal pain.   You have low back pain  that you never had before.   You feel your baby's head pushing down and causing pelvic pressure.   Your baby is not moving as much as it used to.  This information is not intended to replace advice given to you by your health care provider. Make sure you discuss any questions you have with your health care provider. Document Released: 09/08/2005 Document Revised: 12/31/2015 Document Reviewed: 06/20/2013 Elsevier Interactive Patient Education  2017 Elsevier Inc.  

## 2016-09-22 NOTE — MAU Note (Signed)
Pt states contractions started at 1415.  Pt states contractions are 3 minutes apart.  Pt denies discharge and bleeding.  Pt states she is unsure if she is leaking fluid.  Pt states she is feeling the baby move.

## 2016-09-22 NOTE — L&D Delivery Note (Signed)
Delivery Note  Pt progressed steadily through labor and labored down for about 2 h ours.  The baby was on the perineum when she started pushing. After about a 15 min ute 2nd stage, at 1:10 AM a viable female was delivered via  (Presentation: LOA;  ).  APGAR:8/9 , ; weight pending.  .   After 1 minute, the cord was clamped and cut. 40 units of pitocin diluted in 1000cc LR was infused rapidly IV.  The placenta separated spontaneously and delivered via CCT and maternal pushing effort.  It was inspected and appears to be intact with a 3 VC.    Anesthesia:  epidural Episiotomy:  no Lacerations:  none Suture Repair:  n/a Est. Blood Loss (mL):  100  Third stage management assisted by Collie Siadgar Ogar, MS-3  Mom to postpartum.  Baby to Couplet care / Skin to Skin. Placenta to path d/t mec staining  Ashlee Simmons,Ashlee Simmons 10/17/2016, 1:25 AM

## 2016-09-29 ENCOUNTER — Encounter (HOSPITAL_COMMUNITY): Payer: Self-pay | Admitting: *Deleted

## 2016-09-29 ENCOUNTER — Inpatient Hospital Stay (HOSPITAL_COMMUNITY)
Admission: AD | Admit: 2016-09-29 | Discharge: 2016-09-29 | Disposition: A | Discharge status: Home / Self Care | Payer: Medicaid Other | Source: Ambulatory Visit | Attending: Family Medicine | Admitting: Family Medicine

## 2016-09-29 DIAGNOSIS — Z3493 Encounter for supervision of normal pregnancy, unspecified, third trimester: Secondary | ICD-10-CM | POA: Diagnosis present

## 2016-09-29 LAB — AMNISURE RUPTURE OF MEMBRANE (ROM) NOT AT ARMC: Amnisure ROM: NEGATIVE

## 2016-09-29 NOTE — Discharge Instructions (Signed)
Braxton Hicks Contractions °Contractions of the uterus can occur throughout pregnancy. Contractions are not always a sign that you are in labor.  °WHAT ARE BRAXTON HICKS CONTRACTIONS?  °Contractions that occur before labor are called Braxton Hicks contractions, or false labor. Toward the end of pregnancy (32-34 weeks), these contractions can develop more often and may become more forceful. This is not true labor because these contractions do not result in opening (dilatation) and thinning of the cervix. They are sometimes difficult to tell apart from true labor because these contractions can be forceful and people have different pain tolerances. You should not feel embarrassed if you go to the hospital with false labor. Sometimes, the only way to tell if you are in true labor is for your health care provider to look for changes in the cervix. °If there are no prenatal problems or other health problems associated with the pregnancy, it is completely safe to be sent home with false labor and await the onset of true labor. °HOW CAN YOU TELL THE DIFFERENCE BETWEEN TRUE AND FALSE LABOR? °False Labor  °· The contractions of false labor are usually shorter and not as hard as those of true labor.   °· The contractions are usually irregular.   °· The contractions are often felt in the front of the lower abdomen and in the groin.   °· The contractions may go away when you walk around or change positions while lying down.   °· The contractions get weaker and are shorter lasting as time goes on.   °· The contractions do not usually become progressively stronger, regular, and closer together as with true labor.   °True Labor  °· Contractions in true labor last 30-70 seconds, become very regular, usually become more intense, and increase in frequency.   °· The contractions do not go away with walking.   °· The discomfort is usually felt in the top of the uterus and spreads to the lower abdomen and low back.   °· True labor can be  determined by your health care provider with an exam. This will show that the cervix is dilating and getting thinner.   °WHAT TO REMEMBER °· Keep up with your usual exercises and follow other instructions given by your health care provider.   °· Take medicines as directed by your health care provider.   °· Keep your regular prenatal appointments.   °· Eat and drink lightly if you think you are going into labor.   °· If Braxton Hicks contractions are making you uncomfortable:   °¨ Change your position from lying down or resting to walking, or from walking to resting.   °¨ Sit and rest in a tub of warm water.   °¨ Drink 2-3 glasses of water. Dehydration may cause these contractions.   °¨ Do slow and deep breathing several times an hour.   °WHEN SHOULD I SEEK IMMEDIATE MEDICAL CARE? °Seek immediate medical care if: °· Your contractions become stronger, more regular, and closer together.   °· You have fluid leaking or gushing from your vagina.   °· You have a fever.   °· You pass blood-tinged mucus.   °· You have vaginal bleeding.   °· You have continuous abdominal pain.   °· You have low back pain that you never had before.   °· You feel your baby's head pushing down and causing pelvic pressure.   °· Your baby is not moving as much as it used to.   °This information is not intended to replace advice given to you by your health care provider. Make sure you discuss any questions you have with your health care   provider. °Document Released: 09/08/2005 Document Revised: 12/31/2015 Document Reviewed: 06/20/2013 °Elsevier Interactive Patient Education © 2017 [employer] Inc. ° °

## 2016-09-29 NOTE — MAU Note (Signed)
PT  CAME  IN BY EMS.  SAYS AT 4 PM  WAS WALKING - VOMITING  - AND THINKS SROM- BUT MAYBE URINE.      PNC-  HD.     IN MAU - LAST WEEK  - VE   FT.   DENIES HSV AND  MRSA.   GBS-    NEG

## 2016-10-13 ENCOUNTER — Encounter (HOSPITAL_COMMUNITY): Payer: Self-pay | Admitting: *Deleted

## 2016-10-13 ENCOUNTER — Telehealth (HOSPITAL_COMMUNITY): Payer: Self-pay | Admitting: *Deleted

## 2016-10-13 NOTE — Telephone Encounter (Signed)
Preadmission screen  

## 2016-10-15 ENCOUNTER — Inpatient Hospital Stay (EMERGENCY_DEPARTMENT_HOSPITAL)
Admission: AD | Admit: 2016-10-15 | Discharge: 2016-10-15 | Disposition: A | Discharge status: Home / Self Care | Payer: Medicaid Other | Source: Ambulatory Visit | Attending: Obstetrics & Gynecology | Admitting: Obstetrics & Gynecology

## 2016-10-15 ENCOUNTER — Encounter (HOSPITAL_COMMUNITY): Payer: Self-pay

## 2016-10-15 DIAGNOSIS — O26893 Other specified pregnancy related conditions, third trimester: Secondary | ICD-10-CM

## 2016-10-15 DIAGNOSIS — N898 Other specified noninflammatory disorders of vagina: Secondary | ICD-10-CM | POA: Diagnosis not present

## 2016-10-15 DIAGNOSIS — O9989 Other specified diseases and conditions complicating pregnancy, childbirth and the puerperium: Secondary | ICD-10-CM

## 2016-10-15 DIAGNOSIS — Z3A40 40 weeks gestation of pregnancy: Secondary | ICD-10-CM | POA: Insufficient documentation

## 2016-10-15 DIAGNOSIS — Z87891 Personal history of nicotine dependence: Secondary | ICD-10-CM | POA: Insufficient documentation

## 2016-10-15 LAB — WET PREP, GENITAL
Clue Cells Wet Prep HPF POC: NONE SEEN
SPERM: NONE SEEN
TRICH WET PREP: NONE SEEN
YEAST WET PREP: NONE SEEN

## 2016-10-15 LAB — AMNISURE RUPTURE OF MEMBRANE (ROM) NOT AT ARMC: Amnisure ROM: NEGATIVE

## 2016-10-15 NOTE — MAU Provider Note (Signed)
Obstetric Resident MAU Note  Chief Complaint:  Rupture of Membranes    HPI: Ashlee Simmons is a 21 y.o. G1P0 at [redacted]w[redacted]d who presents to maternity admissions reporting persistent leaking of fluid over the past week. She was evaluated at the health department for this and reports having a speculum exam performed and was told that she was not ruptured. She was then evaluated in the MAU on 09/29/16 with concern for possible SROM again. An amnisure was performed at that time that was negative.   Denies regular contractions. No vaginal bleeding. Good fetal movement.   Pregnancy Course: Receives care at [facility] Pregnancy has otherwise been uncomplicated.  There are no active problems to display for this patient.   Past Medical History:  Diagnosis Date  . Anxiety   . Asthma   . Medical history non-contributory     OB History  Gravida Para Term Preterm AB Living  1            SAB TAB Ectopic Multiple Live Births               # Outcome Date GA Lbr Len/2nd Weight Sex Delivery Anes PTL Lv  1 Current               Past Surgical History:  Procedure Laterality Date  . FEMUR SURGERY     plate in pelvis, rods in each femur  . HIP SURGERY    . MOUTH SURGERY      Family History: Family History  Problem Relation Age of Onset  . COPD Maternal Grandfather   . Alcohol abuse Neg Hx   . Arthritis Neg Hx   . Asthma Neg Hx   . Birth defects Neg Hx   . Cancer Neg Hx   . Depression Neg Hx   . Diabetes Neg Hx   . Drug abuse Neg Hx   . Early death Neg Hx   . Hearing loss Neg Hx   . Heart disease Neg Hx   . Hyperlipidemia Neg Hx   . Hypertension Neg Hx   . Kidney disease Neg Hx   . Learning disabilities Neg Hx   . Mental illness Neg Hx   . Mental retardation Neg Hx   . Miscarriages / Stillbirths Neg Hx   . Stroke Neg Hx   . Vision loss Neg Hx   . Varicose Veins Neg Hx     Social History: Social History  Substance Use Topics  . Smoking status: Former Games developer  . Smokeless tobacco:  Never Used  . Alcohol use No     Comment: occ    Allergies: No Known Allergies  No prescriptions prior to admission.    ROS: Pertinent findings in history of present illness.  Physical Exam  Blood pressure 98/57, pulse 83, temperature 98.8 F (37.1 C), resp. rate 17, last menstrual period 12/24/2015. CONSTITUTIONAL: Well-developed, well-nourished female in no acute distress.  HENT:  Normocephalic, atraumatic, Moist mucus membranes.  EYES: Normal conjunctivae without scleral icterus.  NECK: Normal range of motion, supple SKIN: Skin is warm and dry. No rash noted. Not diaphoretic. No erythema. No pallor. NEUROLGIC: Alert and oriented to person, place, and time. No focal defects PSYCHIATRIC: Normal mood and affect. Normal behavior. Normal judgment and thought content. CARDIOVASCULAR: Normal heart rate noted, regular rhythm RESPIRATORY: No increased work of breathing, stable on room air. LCTAB ABDOMEN: Soft, nontender, nondistended, gravid appropriate for gestational age MUSCULOSKELETAL: Normal range of motion. No edema and no tenderness. 2+ distal pulses.  SPECULUM EXAM: NEFG, physiologic discharge, no blood, cervix clean Dilation: 1.5 Effacement (%): 50 Station: -3 Presentation: Vertex Exam by:: Guadlupe Spanish RN   FHT:  Baseline 130s , moderate variability, accelerations present, occasional  Variable decelerations. Only 2 noted after monitoring over 4 hrs.  Contractions: Irregular pattern   Labs: Results for orders placed or performed during the hospital encounter of 10/15/16 (from the past 24 hour(s))  Amnisure rupture of membrane (rom)not at [facility]     Status: None   Collection Time: 10/15/16  4:17 PM  Result Value Ref Range   Amnisure ROM NEGATIVE   Wet prep, genital     Status: Abnormal   Collection Time: 10/15/16  6:04 PM  Result Value Ref Range   Yeast Wet Prep HPF POC NONE SEEN NONE SEEN   Trich, Wet Prep NONE SEEN NONE SEEN   Clue Cells Wet Prep HPF POC NONE SEEN NONE  SEEN   WBC, Wet Prep HPF POC MANY (A) NONE SEEN   Sperm NONE SEEN     Imaging:  No results found.  MAU Course: Patient presented with concern for leaking of fluid with concern for SROM. She was placed on the monitor and noted to have a variable deceleration. Amnisure was obtained and found to be negative. She had a speculum exam performed that revealed leukorrhea consistent with physiologic discharge during pregnancy. No pooling. No vaginal or cervical lesions. A sample was collected and ferning was also negative. Given the deceleration, she was monitored for an additional 4 hrs. During that time she had one additional variable deceleration, but otherwise had a very reassuring FHT with accelerations and good variability. The FHT was discussed with the attending physician.   Assessment: 1. Discharge from the vagina   Suspect leaking fluid is actually physiologic discharge at this time. FHT is overall reassuring with strong variability.   Plan: Discharge home Labor precautionsreviewed Follow up with OB provider at previously scheduled appointment  Follow-up Information    [facility] Follow up.   Why:  Go to previously scheduled appointment Contact information: [address] [place] [address] [phone]           Allergies as of 10/15/2016   No Known Allergies     Medication List    STOP taking these medications   lidocaine 2 % solution Commonly known as:  XYLOCAINE     TAKE these medications   acetaminophen 500 MG tablet Commonly known as:  TYLENOL Take 1,000 mg by mouth every 6 (six) hours as needed for mild pain or headache.   albuterol 108 (90 Base) MCG/ACT inhaler Commonly known as:  PROVENTIL HFA;VENTOLIN HFA Inhale 1-2 puffs into the lungs every 6 (six) hours as needed for wheezing or shortness of breath.   calcium carbonate 500 MG chewable tablet Commonly known as:  TUMS - dosed in mg elemental calcium Chew 2 tablets by mouth daily as  needed for indigestion or heartburn.   diphenhydrAMINE 25 MG tablet Commonly known as:  BENADRYL Take 50 mg by mouth at bedtime as needed for allergies.   prenatal multivitamin Tabs tablet Take 1 tablet by mouth daily at 12 noon.   sodium chloride 0.65 % Soln nasal spray Commonly known as:  OCEAN Place 1 spray into both nostrils as needed for congestion.       Lise Auer, MD PGY-2 10/15/2016 9:09 PM   OB FELLOW MAU DISCHARGE ATTESTATION  I have seen and examined this patient; I agree with above  documentation in the resident's note.   I personally reviewed the patient's NST today, found to be REACTIVE. 125 bpm, mod var, +accels, 1 late and 1 variable decel in 4-5 hours of monitoring. Patient had multiple accels and good variability the rest of the time. CTX: Rare.  Discussed with Dr. Despina HiddenEure who agreed OK for discharge.   Labor precautions and fetal kick counts discussed.   Jen MowElizabeth Tawnee Clegg, DO OB Fellow

## 2016-10-15 NOTE — Discharge Instructions (Signed)
Braxton Hicks Contractions °Contractions of the uterus can occur throughout pregnancy. Contractions are not always a sign that you are in labor.  °WHAT ARE BRAXTON HICKS CONTRACTIONS?  °Contractions that occur before labor are called Braxton Hicks contractions, or false labor. Toward the end of pregnancy (32-34 weeks), these contractions can develop more often and may become more forceful. This is not true labor because these contractions do not result in opening (dilatation) and thinning of the cervix. They are sometimes difficult to tell apart from true labor because these contractions can be forceful and people have different pain tolerances. You should not feel embarrassed if you go to the hospital with false labor. Sometimes, the only way to tell if you are in true labor is for your health care provider to look for changes in the cervix. °If there are no prenatal problems or other health problems associated with the pregnancy, it is completely safe to be sent home with false labor and await the onset of true labor. °HOW CAN YOU TELL THE DIFFERENCE BETWEEN TRUE AND FALSE LABOR? °False Labor  °· The contractions of false labor are usually shorter and not as hard as those of true labor.   °· The contractions are usually irregular.   °· The contractions are often felt in the front of the lower abdomen and in the groin.   °· The contractions may go away when you walk around or change positions while lying down.   °· The contractions get weaker and are shorter lasting as time goes on.   °· The contractions do not usually become progressively stronger, regular, and closer together as with true labor.   °True Labor  °· Contractions in true labor last 30-70 seconds, become very regular, usually become more intense, and increase in frequency.   °· The contractions do not go away with walking.   °· The discomfort is usually felt in the top of the uterus and spreads to the lower abdomen and low back.   °· True labor can be  determined by your health care provider with an exam. This will show that the cervix is dilating and getting thinner.   °WHAT TO REMEMBER °· Keep up with your usual exercises and follow other instructions given by your health care provider.   °· Take medicines as directed by your health care provider.   °· Keep your regular prenatal appointments.   °· Eat and drink lightly if you think you are going into labor.   °· If Braxton Hicks contractions are making you uncomfortable:   °¨ Change your position from lying down or resting to walking, or from walking to resting.   °¨ Sit and rest in a tub of warm water.   °¨ Drink 2-3 glasses of water. Dehydration may cause these contractions.   °¨ Do slow and deep breathing several times an hour.   °WHEN SHOULD I SEEK IMMEDIATE MEDICAL CARE? °Seek immediate medical care if: °· Your contractions become stronger, more regular, and closer together.   °· You have fluid leaking or gushing from your vagina.   °· You have a fever.   °· You pass blood-tinged mucus.   °· You have vaginal bleeding.   °· You have continuous abdominal pain.   °· You have low back pain that you never had before.   °· You feel your baby's head pushing down and causing pelvic pressure.   °· Your baby is not moving as much as it used to.   °This information is not intended to replace advice given to you by your health care provider. Make sure you discuss any questions you have with your health care   provider. °Document Released: 09/08/2005 Document Revised: 12/31/2015 Document Reviewed: 06/20/2013 °Elsevier Interactive Patient Education © 2017 [employer] Inc. ° °

## 2016-10-15 NOTE — MAU Note (Signed)
Pt presents to MAU with leaking of fluid. Pt states she is still leaking fluid from weeks ago. Says she has been seen at the health dept and MAU previously with this. Pt states the leaking "just happens" and is causing her to change her underwear when it does. She is also having some cramping/tightening in her abdomen. Denies Bleeding. Reports good fetal movement.

## 2016-10-16 ENCOUNTER — Encounter (HOSPITAL_COMMUNITY): Payer: Self-pay | Admitting: *Deleted

## 2016-10-16 ENCOUNTER — Inpatient Hospital Stay (HOSPITAL_COMMUNITY)
Admission: AD | Admit: 2016-10-16 | Discharge: 2016-10-19 | DRG: 775 | Disposition: A | Discharge status: Home / Self Care | Payer: Medicaid Other | Source: Ambulatory Visit | Attending: Obstetrics and Gynecology | Admitting: Obstetrics and Gynecology

## 2016-10-16 ENCOUNTER — Inpatient Hospital Stay (HOSPITAL_COMMUNITY): Payer: Medicaid Other | Admitting: Anesthesiology

## 2016-10-16 DIAGNOSIS — Z3493 Encounter for supervision of normal pregnancy, unspecified, third trimester: Secondary | ICD-10-CM | POA: Diagnosis present

## 2016-10-16 DIAGNOSIS — Z23 Encounter for immunization: Secondary | ICD-10-CM

## 2016-10-16 DIAGNOSIS — F419 Anxiety disorder, unspecified: Secondary | ICD-10-CM | POA: Diagnosis present

## 2016-10-16 DIAGNOSIS — O99344 Other mental disorders complicating childbirth: Secondary | ICD-10-CM | POA: Diagnosis present

## 2016-10-16 DIAGNOSIS — O9952 Diseases of the respiratory system complicating childbirth: Secondary | ICD-10-CM | POA: Diagnosis present

## 2016-10-16 DIAGNOSIS — J45909 Unspecified asthma, uncomplicated: Secondary | ICD-10-CM | POA: Diagnosis present

## 2016-10-16 DIAGNOSIS — O99214 Obesity complicating childbirth: Secondary | ICD-10-CM | POA: Diagnosis present

## 2016-10-16 DIAGNOSIS — O23593 Infection of other part of genital tract in pregnancy, third trimester: Secondary | ICD-10-CM | POA: Diagnosis not present

## 2016-10-16 DIAGNOSIS — Z3490 Encounter for supervision of normal pregnancy, unspecified, unspecified trimester: Secondary | ICD-10-CM

## 2016-10-16 DIAGNOSIS — O42113 Preterm premature rupture of membranes, onset of labor more than 24 hours following rupture, third trimester: Secondary | ICD-10-CM | POA: Diagnosis not present

## 2016-10-16 DIAGNOSIS — Z3A40 40 weeks gestation of pregnancy: Secondary | ICD-10-CM

## 2016-10-16 DIAGNOSIS — E669 Obesity, unspecified: Secondary | ICD-10-CM | POA: Diagnosis present

## 2016-10-16 DIAGNOSIS — Z87891 Personal history of nicotine dependence: Secondary | ICD-10-CM | POA: Diagnosis not present

## 2016-10-16 LAB — CBC
HEMATOCRIT: 38.5 % (ref 36.0–46.0)
Hemoglobin: 13.4 g/dL (ref 12.0–15.0)
MCH: 30 pg (ref 26.0–34.0)
MCHC: 34.8 g/dL (ref 30.0–36.0)
MCV: 86.3 fL (ref 78.0–100.0)
Platelets: 160 10*3/uL (ref 150–400)
RBC: 4.46 MIL/uL (ref 3.87–5.11)
RDW: 13.2 % (ref 11.5–15.5)
WBC: 7.8 10*3/uL (ref 4.0–10.5)

## 2016-10-16 LAB — TYPE AND SCREEN
ABO/RH(D): O POS
Antibody Screen: NEGATIVE

## 2016-10-16 LAB — ABO/RH: ABO/RH(D): O POS

## 2016-10-16 MED ORDER — OXYTOCIN BOLUS FROM INFUSION
500.0000 mL | Freq: Once | INTRAVENOUS | Status: AC
  Administered 2016-10-17: 500 mL via INTRAVENOUS

## 2016-10-16 MED ORDER — LIDOCAINE HCL (PF) 1 % IJ SOLN
30.0000 mL | INTRAMUSCULAR | Status: DC | PRN
  Filled 2016-10-16: qty 30

## 2016-10-16 MED ORDER — SOD CITRATE-CITRIC ACID 500-334 MG/5ML PO SOLN: 30.0000 mL | ORAL | Status: DC | PRN

## 2016-10-16 MED ORDER — BUTORPHANOL TARTRATE 1 MG/ML IJ SOLN
2.0000 mg | Freq: Once | INTRAMUSCULAR | Status: AC
  Administered 2016-10-16: 2 mg via INTRAVENOUS
  Filled 2016-10-16: qty 2

## 2016-10-16 MED ORDER — ONDANSETRON HCL 4 MG/2ML IJ SOLN: 4.0000 mg | Freq: Four times a day (QID) | INTRAMUSCULAR | Status: DC | PRN

## 2016-10-16 MED ORDER — PHENYLEPHRINE 40 MCG/ML (10ML) SYRINGE FOR IV PUSH (FOR BLOOD PRESSURE SUPPORT)
80.0000 ug | PREFILLED_SYRINGE | INTRAVENOUS | Status: DC | PRN
  Filled 2016-10-16: qty 10
  Filled 2016-10-16: qty 5

## 2016-10-16 MED ORDER — FENTANYL CITRATE (PF) 100 MCG/2ML IJ SOLN
100.0000 ug | INTRAMUSCULAR | Status: DC | PRN
  Administered 2016-10-16: 100 ug via INTRAVENOUS

## 2016-10-16 MED ORDER — EPHEDRINE 5 MG/ML INJ
10.0000 mg | INTRAVENOUS | Status: DC | PRN
  Filled 2016-10-16: qty 4

## 2016-10-16 MED ORDER — LIDOCAINE HCL (PF) 1 % IJ SOLN
INTRAMUSCULAR | Status: DC | PRN
  Administered 2016-10-16: 5 mL via EPIDURAL
  Administered 2016-10-16: 3 mL via EPIDURAL
  Administered 2016-10-16: 2 mL via EPIDURAL

## 2016-10-16 MED ORDER — ACETAMINOPHEN 325 MG PO TABS: 650.0000 mg | ORAL_TABLET | ORAL | Status: DC | PRN

## 2016-10-16 MED ORDER — LACTATED RINGERS IV SOLN: 500.0000 mL | Freq: Once | INTRAVENOUS | Status: DC

## 2016-10-16 MED ORDER — LACTATED RINGERS IV SOLN: INTRAVENOUS | Status: DC

## 2016-10-16 MED ORDER — FENTANYL CITRATE (PF) 100 MCG/2ML IJ SOLN
INTRAMUSCULAR | Status: AC
  Filled 2016-10-16: qty 2

## 2016-10-16 MED ORDER — OXYCODONE-ACETAMINOPHEN 5-325 MG PO TABS: 2.0000 | ORAL_TABLET | ORAL | Status: DC | PRN

## 2016-10-16 MED ORDER — LACTATED RINGERS IV SOLN
500.0000 mL | INTRAVENOUS | Status: DC | PRN
Start: 2016-10-16 — End: 2016-10-17
  Administered 2016-10-16: 500 mL via INTRAVENOUS

## 2016-10-16 MED ORDER — FLEET ENEMA 7-19 GM/118ML RE ENEM: 1.0000 | ENEMA | RECTAL | Status: DC | PRN

## 2016-10-16 MED ORDER — PHENYLEPHRINE 40 MCG/ML (10ML) SYRINGE FOR IV PUSH (FOR BLOOD PRESSURE SUPPORT)
80.0000 ug | PREFILLED_SYRINGE | INTRAVENOUS | Status: DC | PRN
  Filled 2016-10-16: qty 5

## 2016-10-16 MED ORDER — OXYCODONE-ACETAMINOPHEN 5-325 MG PO TABS: 1.0000 | ORAL_TABLET | ORAL | Status: DC | PRN

## 2016-10-16 MED ORDER — OXYTOCIN 40 UNITS IN LACTATED RINGERS INFUSION - SIMPLE MED
2.5000 [IU]/h | INTRAVENOUS | Status: DC
  Filled 2016-10-16: qty 1000

## 2016-10-16 MED ORDER — HYDROXYZINE HCL 50 MG/ML IM SOLN
25.0000 mg | Freq: Four times a day (QID) | INTRAMUSCULAR | Status: DC | PRN
  Administered 2016-10-16: 25 mg via INTRAMUSCULAR
  Filled 2016-10-16 (×2): qty 0.5

## 2016-10-16 MED ORDER — FENTANYL 2.5 MCG/ML BUPIVACAINE 1/10 % EPIDURAL INFUSION (WH - ANES)
14.0000 mL/h | INTRAMUSCULAR | Status: DC | PRN
  Administered 2016-10-16: 14 mL/h via EPIDURAL
  Filled 2016-10-16: qty 100

## 2016-10-16 MED ORDER — DIPHENHYDRAMINE HCL 50 MG/ML IJ SOLN: 12.5000 mg | INTRAMUSCULAR | Status: DC | PRN

## 2016-10-16 NOTE — MAU Note (Signed)
Pt stated she started having ctx since last night about 3-5 min. Denies vag discharge had a little bit of vag bleeding. Good fetal movement reported.

## 2016-10-16 NOTE — Anesthesia Preprocedure Evaluation (Signed)
Anesthesia Evaluation  Patient identified by MRN, date of birth, ID band Patient awake    Reviewed: Allergy & Precautions, NPO status , Patient's Chart, lab work & pertinent test results  Airway Mallampati: II  TM Distance: >3 FB Neck ROM: Full    Dental  (+) Teeth Intact, Dental Advisory Given   Pulmonary asthma , former smoker,    Pulmonary exam normal breath sounds clear to auscultation       Cardiovascular negative cardio ROS Normal cardiovascular exam Rhythm:Regular Rate:Normal     Neuro/Psych PSYCHIATRIC DISORDERS Anxiety negative neurological ROS     GI/Hepatic negative GI ROS, Neg liver ROS,   Endo/Other  Obesity   Renal/GU negative Renal ROS     Musculoskeletal negative musculoskeletal ROS (+)   Abdominal   Peds  Hematology negative hematology ROS (+) Plt 160k   Anesthesia Other Findings Day of surgery medications reviewed with the patient.  Reproductive/Obstetrics (+) Pregnancy                             Anesthesia Physical Anesthesia Plan  ASA: II  Anesthesia Plan: Epidural   Post-op Pain Management:    Induction:   Airway Management Planned:   Additional Equipment:   Intra-op Plan:   Post-operative Plan:   Informed Consent: I have reviewed the patients History and Physical, chart, labs and discussed the procedure including the risks, benefits and alternatives for the proposed anesthesia with the patient or authorized representative who has indicated his/her understanding and acceptance.   Dental advisory given  Plan Discussed with:   Anesthesia Plan Comments: (Patient identified. Risks/Benefits/Options discussed with patient including but not limited to bleeding, infection, nerve damage, paralysis, failed block, incomplete pain control, headache, blood pressure changes, nausea, vomiting, reactions to medication both or allergic, itching and postpartum back  pain. Confirmed with bedside nurse the patient's most recent platelet count. Confirmed with patient that they are not currently taking any anticoagulation, have any bleeding history or any family history of bleeding disorders. Patient expressed understanding and wished to proceed. All questions were answered. )        Anesthesia Quick Evaluation
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
## 2016-10-16 NOTE — Progress Notes (Signed)
AROM w/mod med fluid  FHR w/occ mild variable.  Comfortable w/epidural.

## 2016-10-16 NOTE — Anesthesia Procedure Notes (Signed)
Epidural Patient location during procedure: OB Start time: 10/16/2016 7:10 PM End time: 10/16/2016 7:15 PM  Staffing Anesthesiologist: Cecile HearingURK, STEPHEN EDWARD Performed: anesthesiologist   Preanesthetic Checklist Completed: patient identified, pre-op evaluation, timeout performed, IV checked, risks and benefits discussed and monitors and equipment checked  Epidural Patient position: sitting Prep: DuraPrep Patient monitoring: blood pressure and continuous pulse ox Approach: midline Location: L3-L4 Injection technique: LOR air  Needle:  Needle type: Tuohy  Needle gauge: 17 G Needle length: 9 cm Needle insertion depth: 6 cm Catheter size: 19 Gauge Catheter at skin depth: 11 cm Test dose: negative and Other (1% Lidocaine)  Additional Notes Patient identified.  Risk benefits discussed including failed block, incomplete pain control, headache, nerve damage, paralysis, blood pressure changes, nausea, vomiting, reactions to medication both toxic or allergic, and postpartum back pain.  Patient expressed understanding and wished to proceed.  All questions were answered.  Sterile technique used throughout procedure and epidural site dressed with sterile barrier dressing. No paresthesia or other complications noted. The patient did not experience any signs of intravascular injection such as tinnitus or metallic taste in mouth nor signs of intrathecal spread such as rapid motor block. Please see nursing notes for vital signs. Reason for block:procedure for pain

## 2016-10-16 NOTE — Anesthesia Pain Management Evaluation Note (Signed)
  CRNA Pain Management Visit Note  Patient: Ashlee RifeBrittany Vanegas, 21 y.o., female  "Hello I am a member of the anesthesia team at [facility]. We have an anesthesia team available at all times to provide care throughout the hospital, including epidural management and anesthesia for C-section. I don't know your plan for the delivery whether it a natural birth, water birth, IV sedation, nitrous supplementation, doula or epidural, but we want to meet your pain goals."   1.Was your pain managed to your expectations on prior hospitalizations?   No prior hospitalizations  2.What is your expectation for pain management during this hospitalization?     Epidural  3.How can we help you reach that goal?   Record the patient's initial score and the patient's pain goal.   Pain: 8  Pain Goal: 6 [facility] wants you to be able to say your pain was always managed very well.  Laban EmperorMalinova,Emelia Sandoval Hristova 10/16/2016

## 2016-10-17 ENCOUNTER — Encounter (HOSPITAL_COMMUNITY): Payer: Self-pay | Admitting: *Deleted

## 2016-10-17 DIAGNOSIS — O42113 Preterm premature rupture of membranes, onset of labor more than 24 hours following rupture, third trimester: Secondary | ICD-10-CM

## 2016-10-17 DIAGNOSIS — O23593 Infection of other part of genital tract in pregnancy, third trimester: Secondary | ICD-10-CM

## 2016-10-17 DIAGNOSIS — Z3A40 40 weeks gestation of pregnancy: Secondary | ICD-10-CM

## 2016-10-17 LAB — RPR: RPR Ser Ql: NONREACTIVE

## 2016-10-17 MED ORDER — FLEET ENEMA 7-19 GM/118ML RE ENEM: 1.0000 | ENEMA | Freq: Every day | RECTAL | Status: DC | PRN

## 2016-10-17 MED ORDER — WITCH HAZEL-GLYCERIN EX PADS: 1.0000 "application " | MEDICATED_PAD | CUTANEOUS | Status: DC | PRN

## 2016-10-17 MED ORDER — ONDANSETRON HCL 4 MG PO TABS: 4.0000 mg | ORAL_TABLET | ORAL | Status: DC | PRN

## 2016-10-17 MED ORDER — ACETAMINOPHEN 325 MG PO TABS
650.0000 mg | ORAL_TABLET | ORAL | Status: DC | PRN
  Administered 2016-10-17: 650 mg via ORAL
  Filled 2016-10-17: qty 2

## 2016-10-17 MED ORDER — COCONUT OIL OIL: 1.0000 "application " | TOPICAL_OIL | Status: DC | PRN

## 2016-10-17 MED ORDER — FERROUS SULFATE 325 (65 FE) MG PO TABS
325.0000 mg | ORAL_TABLET | Freq: Two times a day (BID) | ORAL | Status: DC
  Administered 2016-10-17 – 2016-10-19 (×5): 325 mg via ORAL
  Filled 2016-10-17 (×5): qty 1

## 2016-10-17 MED ORDER — DIBUCAINE 1 % RE OINT: 1.0000 "application " | TOPICAL_OINTMENT | RECTAL | Status: DC | PRN

## 2016-10-17 MED ORDER — DOCUSATE SODIUM 100 MG PO CAPS
100.0000 mg | ORAL_CAPSULE | Freq: Two times a day (BID) | ORAL | Status: DC
  Administered 2016-10-17: 100 mg via ORAL
  Filled 2016-10-17 (×4): qty 1

## 2016-10-17 MED ORDER — DIPHENHYDRAMINE HCL 25 MG PO CAPS
25.0000 mg | ORAL_CAPSULE | Freq: Four times a day (QID) | ORAL | Status: DC | PRN
Start: 2016-10-17 — End: 2016-10-19

## 2016-10-17 MED ORDER — OXYCODONE HCL 5 MG PO TABS: 10.0000 mg | ORAL_TABLET | ORAL | Status: DC | PRN

## 2016-10-17 MED ORDER — TETANUS-DIPHTH-ACELL PERTUSSIS 5-2.5-18.5 LF-MCG/0.5 IM SUSP: 0.5000 mL | Freq: Once | INTRAMUSCULAR | Status: DC

## 2016-10-17 MED ORDER — SIMETHICONE 80 MG PO CHEW: 80.0000 mg | CHEWABLE_TABLET | ORAL | Status: DC | PRN

## 2016-10-17 MED ORDER — ONDANSETRON HCL 4 MG/2ML IJ SOLN: 4.0000 mg | INTRAMUSCULAR | Status: DC | PRN

## 2016-10-17 MED ORDER — BENZOCAINE-MENTHOL 20-0.5 % EX AERO: 1.0000 "application " | INHALATION_SPRAY | CUTANEOUS | Status: DC | PRN

## 2016-10-17 MED ORDER — ZOLPIDEM TARTRATE 5 MG PO TABS: 5.0000 mg | ORAL_TABLET | Freq: Every evening | ORAL | Status: DC | PRN

## 2016-10-17 MED ORDER — METHYLERGONOVINE MALEATE 0.2 MG PO TABS: 0.2000 mg | ORAL_TABLET | ORAL | Status: DC | PRN

## 2016-10-17 MED ORDER — METHYLERGONOVINE MALEATE 0.2 MG/ML IJ SOLN: 0.2000 mg | INTRAMUSCULAR | Status: DC | PRN

## 2016-10-17 MED ORDER — PRENATAL MULTIVITAMIN CH
1.0000 | ORAL_TABLET | Freq: Every day | ORAL | Status: DC
  Administered 2016-10-17 – 2016-10-19 (×3): 1 via ORAL
  Filled 2016-10-17 (×3): qty 1

## 2016-10-17 MED ORDER — IBUPROFEN 600 MG PO TABS
600.0000 mg | ORAL_TABLET | Freq: Four times a day (QID) | ORAL | Status: DC
  Administered 2016-10-17 – 2016-10-19 (×10): 600 mg via ORAL
  Filled 2016-10-17 (×10): qty 1

## 2016-10-17 MED ORDER — OXYCODONE HCL 5 MG PO TABS
5.0000 mg | ORAL_TABLET | ORAL | Status: DC | PRN
  Administered 2016-10-17: 5 mg via ORAL
  Filled 2016-10-17: qty 1

## 2016-10-17 MED ORDER — MEASLES, MUMPS & RUBELLA VAC ~~LOC~~ INJ
0.5000 mL | INJECTION | Freq: Once | SUBCUTANEOUS | Status: DC
  Filled 2016-10-17: qty 0.5

## 2016-10-17 MED ORDER — BISACODYL 10 MG RE SUPP: 10.0000 mg | Freq: Every day | RECTAL | Status: DC | PRN

## 2016-10-17 NOTE — Lactation Note (Signed)
This note was copied from a baby's chart. Lactation Consultation Note  Patient Name: Ashlee Lewanda RifeBrittany Simmons ZOXWR'UToday's Date: 10/17/2016 Reason for consult: Initial assessment;Infant < 6lbs  Visited with first time Mom, baby 3913 hrs old.  Baby 5357w4d, and weighs 5 lbs 10.3 oz.  Mom chose to bottle feed saying she tried to breastfeed once.  Baby has had 4 bottles of formula (3-15 ml).  Asked mom if she would like help with latching baby, she can put her call light on and ask for help.  I told Mom that I would be happy to assist at next feeding.  Baby sleeping in crib currently, and about to have her bath.   Brochure left in room.  Explained about IP and OP lactation services available to her.  Consult Status Consult Status: Follow-up Date: 10/18/16 Follow-up type: In-patient    Ashlee Simmons, Torry Istre E 10/17/2016, 3:09 PM

## 2016-10-17 NOTE — Lactation Note (Signed)
This note was copied from a baby's chart. Lactation Consultation Note  Patient Name: Ashlee Simmons [id number]'s Date: 10/17/2016 Reason for consult: Initial assessment;Infant < 6lbs   Called into room for assistance with breastfeeding.  Baby sleeping STS on Mom's chest following her bath.  Started to assist with a laid back position, but positioning baby across abdomen.  Mom has large, heavy breasts that splay out to side.  Pillows used to support Mom's arm and breast.  Mom became very uncomfortable and agitated stating she was in extreme uterine cramping pain.  After a few minutes, I suggested she should call for pain medication as she hadn't taken any post partum yet.  Mom medicated.. Asked Mom if she would like me to set up the DEBP, with instructions on cleaning.  Instructed Mom on pumping >8 times in 24 hrs if baby isn't latching to the breast.  Baby should be fed every 3 hrs, or sooner if she acts hungry.  Volume guideline handout given.  Mom to given 5-10 ml formula +/EBM by slow flow bottle.  Paced feeding discussed, and demonstrated to FOB.  Assisted Mom to start to pump.  Teaching on pump initiation setting, and handed Mom the flanges.  Mom immediately began writhing in pain in bed.  LC took the flanges from her and suggested she wait until she felt better.  Mom agreed she wanted to wait.  Encouraged Mom to call her nurse for assistance with latching and pumping as needed.  Lactation will follow up in am and prn as needed.      Consult Status Consult Status: Follow-up Date: 10/18/16 Follow-up type: In-patient    Ashlee Simmons, [name] 10/17/2016, 4:39 PM
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
## 2016-10-17 NOTE — Anesthesia Postprocedure Evaluation (Signed)
Anesthesia Post Note  Patient: Gren[place]ittany Mischke  Procedure(s) Performed: * No procedures listed *  Patient location during evaluation: Mother Baby Anesthesia Type: Epidural Level of consciousness: awake and alert Pain management: pain level controlled Vital Signs Assessment: post-procedure vital signs reviewed and stable Respiratory status: spontaneous breathing, nonlabored ventilation and respiratory function stable Cardiovascular status: stable Postop Assessment: no headache, no backache and epidural receding Anesthetic complications: no        Last Vitals:  Vitals:   10/17/16 0420 10/17/16 0841  BP: 119/63 110/60  Pulse: (!) 108 78  Resp: 18 18  Temp: 36.9 C 36.8 C    Last Pain:  Vitals:   10/17/16 0858  TempSrc:   PainSc: 0-No pain   Pain Goal: Patients Stated Pain Goal: 0 (10/16/16 1541)               Junious SilkGILBERT,Riah Kehoe

## 2016-10-18 NOTE — Progress Notes (Signed)
Post Partum Day #1 Subjective: no complaints, up ad lib and tolerating PO; breast and bottlefeeding; desires IUD for contraception; baby under bili lights  Objective: Blood pressure 115/68, pulse 80, temperature 98.3 F (36.8 C), temperature source Oral, resp. rate 18, height 5\' 3"  (1.6 m), weight 83.5 kg (184 lb), last menstrual period 12/24/2015, SpO2 (!) 84 %, unknown if currently breastfeeding.  Physical Exam:  General: alert, cooperative and no distress Lochia: appropriate Uterine Fundus: firm DVT Evaluation: No evidence of DVT seen on physical exam.   Recent Labs  10/16/16 1640  HGB 13.4  HCT 38.5    Assessment/Plan: Plan for discharge tomorrow   LOS: 2 days   Cam HaiSHAW, Dervin Vore CNM 10/18/2016, 8:15 AM

## 2016-10-19 DIAGNOSIS — O42113 Preterm premature rupture of membranes, onset of labor more than 24 hours following rupture, third trimester: Secondary | ICD-10-CM

## 2016-10-19 DIAGNOSIS — Z3A40 40 weeks gestation of pregnancy: Secondary | ICD-10-CM

## 2016-10-19 NOTE — Discharge Instructions (Signed)

## 2016-10-19 NOTE — Discharge Summary (Signed)
OB Discharge Summary     Patient Name: Ashlee Simmons DOB: 01-Sep-1996 MRN: [id number]  Date of admission: 10/16/2016 Delivering MD: Christin Fudge   Date of discharge: 10/19/2016  Admitting diagnosis: 40w ctx 3-5 min, pressure Intrauterine pregnancy: [redacted]w[redacted]d    Secondary diagnosis:  Active Problems:   Pregnant and not yet delivered  Additional problems: Hx Anxiety     Discharge diagnosis: Term Pregnancy Delivered                                                                                                Post partum procedures:MMR  Augmentation: None  Complications: None  Hospital course:  Onset of Labor With Vaginal Delivery     21y.o. yo G1P1001 at 426w4das admitted in Active Labor on 10/16/2016. Patient had an uncomplicated labor course as follows:  Membrane Rupture Time/Date: 7:50 PM ,10/16/2016   Intrapartum Procedures: Episiotomy: None [1]                                         Lacerations:  None [1]  Patient had a delivery of a Viable infant. 10/17/2016  Information for the patient's newborn:  SnPearle, Wand[place] Br[place]0[[id number]]Delivery Method: Vaginal, Spontaneous Delivery (Filed from Delivery Summary)    Pateint had an uncomplicated postpartum course.  She is ambulating, tolerating a regular diet, passing flatus, and urinating well. Patient is discharged home in stable condition on 10/19/16.   Physical exam  Vitals:   10/17/16 0841 10/18/16 0616 10/18/16 1720 10/19/16 0638  BP: 110/60 115/68 (!) 100/58 111/62  Pulse: 78 80 76 68  Resp: '18 18 18 18  '$ Temp: 98.2 F (36.8 C) 98.3 F (36.8 C) 98.5 F (36.9 C)   TempSrc: Oral Oral Oral   SpO2:      Weight:      Height:      General: alert, cooperative and appears stated age Lo79appropriate Uterine Fundus: firm Incision: n/a DVT Evaluation: No evidence of DVT seen on physical exam. Negative Homan's sign. Labs: Lab Results  Component Value Date   WBC 7.8 10/16/2016   HGB 13.4 10/16/2016    HCT 38.5 10/16/2016   MCV 86.3 10/16/2016   PLT 160 10/16/2016   CMP Latest Ref Rng & Units 06/09/2015  Glucose 65 - 99 mg/dL 97  BUN 6 - 20 mg/dL 5(L)  Creatinine 0.44 - 1.00 mg/dL 0.67  Sodium 135 - 145 mmol/L 135  Potassium 3.5 - 5.1 mmol/L 4.1  Chloride 101 - 111 mmol/L 107  CO2 22 - 32 mmol/L 22  Calcium 8.9 - 10.3 mg/dL 9.0  Total Protein 6.5 - 8.1 g/dL 7.2  Total Bilirubin 0.3 - 1.2 mg/dL 0.5  Alkaline Phos 38 - 126 U/L 80  AST 15 - 41 U/L 20  ALT 14 - 54 U/L 14    Discharge instruction: per After Visit Summary and "Baby and Me Booklet".  After visit meds:  Allergies as of 10/19/2016   No Known Allergies  Medication List    STOP taking these medications   diphenhydrAMINE 25 MG tablet Commonly known as:  BENADRYL     TAKE these medications   acetaminophen 500 MG tablet Commonly known as:  TYLENOL Take 1,000 mg by mouth every 6 (six) hours as needed for mild pain or headache.   albuterol 108 (90 Base) MCG/ACT inhaler Commonly known as:  PROVENTIL HFA;VENTOLIN HFA Inhale 1-2 puffs into the lungs every 6 (six) hours as needed for wheezing or shortness of breath.   calcium carbonate 500 MG chewable tablet Commonly known as:  TUMS - dosed in mg elemental calcium Chew 2 tablets by mouth daily as needed for indigestion or heartburn.   lidocaine 2 % solution Commonly known as:  XYLOCAINE Use as directed 5 mLs in the mouth or throat every 3 (three) hours as needed for mouth pain.   prenatal multivitamin Tabs tablet Take 1 tablet by mouth daily at 12 noon.       Diet: routine diet  Activity: Advance as tolerated. Pelvic rest for 6 weeks.   Outpatient follow up:6 weeks Follow up Appt:No future appointments. Follow up Visit:No Follow-up on file.  Postpartum contraception: IUD TBD  Newborn Data: Live born female  Birth Weight: 5 lb 10.3 oz (2560 g) APGAR: 7, 9  Baby Feeding: Breat/bottle Disposition:home with mother   10/19/2016 Kathrine Haddock,  CNM

## 2016-10-20 ENCOUNTER — Inpatient Hospital Stay (HOSPITAL_COMMUNITY): Admission: RE | Admit: 2016-10-20 | Payer: Medicaid Other | Source: Ambulatory Visit

## 2017-09-22 NOTE — L&D Delivery Note (Signed)
Delivery Note At 2:53 PM a viable female was delivered via Vaginal, Spontaneous (Presentation:OA ;LOA  ).  APGAR: 9, 9; weight  .   Placenta status:complete , intact .  Cord: 3VC  with the following complications: shoulder dystocia  .  Cord pH: NA  Anesthesia:  Epidural  Episiotomy: None Lacerations: None Suture Repair: NA Est. Blood Loss (mL): 233  Mom to postpartum.  Baby to Couplet care / Skin to Skin.  Thressa Sheller 06/19/2018, 3:10 PM  Patient was admitted in spontaneous labor. She received an epidural and progressed to C/C/+1 with BBOW. SROM with meconium stained fluid. Patient pushed with excellent effort with delivery of fetal head. Delay in delivery of shoulders assisted with rotation of shoulders, mcroberts and superpubic pressure.

## 2017-10-23 ENCOUNTER — Ambulatory Visit: Payer: Self-pay

## 2017-12-01 ENCOUNTER — Other Ambulatory Visit (HOSPITAL_COMMUNITY): Payer: Self-pay | Admitting: Nurse Practitioner

## 2017-12-01 DIAGNOSIS — Z369 Encounter for antenatal screening, unspecified: Secondary | ICD-10-CM

## 2017-12-11 ENCOUNTER — Encounter (HOSPITAL_COMMUNITY): Payer: Self-pay | Admitting: Nurse Practitioner

## 2017-12-16 ENCOUNTER — Encounter (HOSPITAL_COMMUNITY): Payer: Self-pay | Admitting: *Deleted

## 2017-12-17 ENCOUNTER — Encounter (HOSPITAL_COMMUNITY): Payer: Self-pay

## 2017-12-17 ENCOUNTER — Ambulatory Visit (HOSPITAL_COMMUNITY)
Admission: RE | Admit: 2017-12-17 | Discharge: 2017-12-17 | Disposition: A | Discharge status: Home / Self Care | Payer: Medicaid Other | Source: Ambulatory Visit | Attending: Nurse Practitioner | Admitting: Nurse Practitioner

## 2017-12-17 ENCOUNTER — Other Ambulatory Visit (HOSPITAL_COMMUNITY): Payer: Self-pay | Admitting: Nurse Practitioner

## 2017-12-17 DIAGNOSIS — Z369 Encounter for antenatal screening, unspecified: Secondary | ICD-10-CM

## 2017-12-17 DIAGNOSIS — J45909 Unspecified asthma, uncomplicated: Secondary | ICD-10-CM | POA: Insufficient documentation

## 2017-12-17 DIAGNOSIS — Z3689 Encounter for other specified antenatal screening: Secondary | ICD-10-CM | POA: Insufficient documentation

## 2017-12-17 DIAGNOSIS — Z3A13 13 weeks gestation of pregnancy: Secondary | ICD-10-CM

## 2017-12-17 DIAGNOSIS — O99281 Endocrine, nutritional and metabolic diseases complicating pregnancy, first trimester: Secondary | ICD-10-CM | POA: Insufficient documentation

## 2017-12-17 DIAGNOSIS — O99511 Diseases of the respiratory system complicating pregnancy, first trimester: Secondary | ICD-10-CM | POA: Insufficient documentation

## 2017-12-17 DIAGNOSIS — Z3682 Encounter for antenatal screening for nuchal translucency: Secondary | ICD-10-CM | POA: Diagnosis not present

## 2017-12-17 DIAGNOSIS — O09892 Supervision of other high risk pregnancies, second trimester: Secondary | ICD-10-CM | POA: Insufficient documentation

## 2017-12-17 DIAGNOSIS — E039 Hypothyroidism, unspecified: Secondary | ICD-10-CM | POA: Diagnosis not present

## 2017-12-17 HISTORY — DX: Major depressive disorder, single episode, unspecified: F32.9

## 2017-12-17 HISTORY — DX: Depression, unspecified: F32.A

## 2017-12-17 NOTE — Addendum Note (Signed)
Encounter addended by: Marcellina MillinMoser, [place] Road[place] L, RTR on: 12/17/2017 10:41 AM  Actions taken: Imaging Exam ended

## 2017-12-28 ENCOUNTER — Other Ambulatory Visit (HOSPITAL_COMMUNITY): Payer: Self-pay

## 2018-01-06 ENCOUNTER — Inpatient Hospital Stay (HOSPITAL_COMMUNITY): Payer: Medicaid Other

## 2018-01-06 ENCOUNTER — Other Ambulatory Visit: Payer: Self-pay

## 2018-01-06 ENCOUNTER — Inpatient Hospital Stay (HOSPITAL_COMMUNITY)
Admission: AD | Admit: 2018-01-06 | Discharge: 2018-01-06 | Disposition: A | Discharge status: Home / Self Care | Payer: Medicaid Other | Source: Ambulatory Visit | Attending: Family Medicine | Admitting: Family Medicine

## 2018-01-06 ENCOUNTER — Encounter (HOSPITAL_COMMUNITY): Payer: Self-pay

## 2018-01-06 DIAGNOSIS — O26899 Other specified pregnancy related conditions, unspecified trimester: Secondary | ICD-10-CM

## 2018-01-06 DIAGNOSIS — Z836 Family history of other diseases of the respiratory system: Secondary | ICD-10-CM | POA: Insufficient documentation

## 2018-01-06 DIAGNOSIS — Z9889 Other specified postprocedural states: Secondary | ICD-10-CM | POA: Insufficient documentation

## 2018-01-06 DIAGNOSIS — Z3A16 16 weeks gestation of pregnancy: Secondary | ICD-10-CM | POA: Diagnosis not present

## 2018-01-06 DIAGNOSIS — J45909 Unspecified asthma, uncomplicated: Secondary | ICD-10-CM | POA: Diagnosis not present

## 2018-01-06 DIAGNOSIS — R141 Gas pain: Secondary | ICD-10-CM

## 2018-01-06 DIAGNOSIS — Z87891 Personal history of nicotine dependence: Secondary | ICD-10-CM | POA: Insufficient documentation

## 2018-01-06 DIAGNOSIS — N949 Unspecified condition associated with female genital organs and menstrual cycle: Secondary | ICD-10-CM | POA: Diagnosis not present

## 2018-01-06 DIAGNOSIS — R109 Unspecified abdominal pain: Secondary | ICD-10-CM | POA: Diagnosis not present

## 2018-01-06 DIAGNOSIS — O99512 Diseases of the respiratory system complicating pregnancy, second trimester: Secondary | ICD-10-CM | POA: Diagnosis not present

## 2018-01-06 DIAGNOSIS — O26892 Other specified pregnancy related conditions, second trimester: Secondary | ICD-10-CM

## 2018-01-06 LAB — URINALYSIS, ROUTINE W REFLEX MICROSCOPIC
Bacteria, UA: NONE SEEN
Bilirubin Urine: NEGATIVE
GLUCOSE, UA: NEGATIVE mg/dL
HGB URINE DIPSTICK: NEGATIVE
KETONES UR: NEGATIVE mg/dL
NITRITE: NEGATIVE
PROTEIN: NEGATIVE mg/dL
Specific Gravity, Urine: 1.018 (ref 1.005–1.030)
pH: 5 (ref 5.0–8.0)

## 2018-01-06 LAB — COMPREHENSIVE METABOLIC PANEL
ALBUMIN: 3.8 g/dL (ref 3.5–5.0)
ALT: 16 U/L (ref 14–54)
AST: 16 U/L (ref 15–41)
Alkaline Phosphatase: 70 U/L (ref 38–126)
Anion gap: 10 (ref 5–15)
BUN: 5 mg/dL — ABNORMAL LOW (ref 6–20)
CHLORIDE: 107 mmol/L (ref 101–111)
CO2: 19 mmol/L — AB (ref 22–32)
Calcium: 8.7 mg/dL — ABNORMAL LOW (ref 8.9–10.3)
Creatinine, Ser: 0.34 mg/dL — ABNORMAL LOW (ref 0.44–1.00)
GFR calc Af Amer: >60 mL/min (ref >60)
GFR calc non Af Amer: >60 mL/min (ref >60)
GLUCOSE: 89 mg/dL (ref 65–99)
POTASSIUM: 4 mmol/L (ref 3.5–5.1)
Sodium: 136 mmol/L (ref 135–145)
Total Bilirubin: 0.5 mg/dL (ref 0.3–1.2)
Total Protein: 6.7 g/dL (ref 6.5–8.1)

## 2018-01-06 LAB — CBC WITH DIFFERENTIAL/PLATELET
BASOS ABS: 0 10*3/uL (ref 0.0–0.1)
BASOS PCT: 0 %
EOS PCT: 1 %
Eosinophils Absolute: 0.1 10*3/uL (ref 0.0–0.7)
HCT: 38.2 % (ref 36.0–46.0)
Hemoglobin: 13.8 g/dL (ref 12.0–15.0)
Lymphocytes Relative: 14 %
Lymphs Abs: 1.2 10*3/uL (ref 0.7–4.0)
MCH: 30.7 pg (ref 26.0–34.0)
MCHC: 36.1 g/dL — ABNORMAL HIGH (ref 30.0–36.0)
MCV: 84.9 fL (ref 78.0–100.0)
MONO ABS: 0.2 10*3/uL (ref 0.1–1.0)
Monocytes Relative: 3 %
NEUTROS ABS: 6.6 10*3/uL (ref 1.7–7.7)
Neutrophils Relative %: 82 %
PLATELETS: 159 10*3/uL (ref 150–400)
RBC: 4.5 MIL/uL (ref 3.87–5.11)
RDW: 12.4 % (ref 11.5–15.5)
WBC: 8.1 10*3/uL (ref 4.0–10.5)

## 2018-01-06 LAB — WET PREP, GENITAL
Sperm: NONE SEEN
Trich, Wet Prep: NONE SEEN
Yeast Wet Prep HPF POC: NONE SEEN

## 2018-01-06 LAB — LIPASE, BLOOD: LIPASE: 26 U/L (ref 11–51)

## 2018-01-06 MED ORDER — KETOROLAC TROMETHAMINE 60 MG/2ML IM SOLN
60.0000 mg | Freq: Once | INTRAMUSCULAR | Status: AC
  Administered 2018-01-06: 60 mg via INTRAMUSCULAR
  Filled 2018-01-06: qty 2

## 2018-01-06 MED ORDER — SIMETHICONE 80 MG PO CHEW: 80.0000 mg | CHEWABLE_TABLET | Freq: Four times a day (QID) | ORAL | 0 refills | Status: DC | PRN

## 2018-01-06 NOTE — Discharge Instructions (Signed)
Round Ligament Pain °The round ligament is a cord of muscle and tissue that helps to support the uterus. It can become a source of pain during pregnancy if it becomes stretched or twisted as the baby grows. The pain usually begins in the second trimester of pregnancy, and it can come and go until the baby is delivered. It is not a serious problem, and it does not cause harm to the baby. °Round ligament pain is usually a short, sharp, and pinching pain, but it can also be a dull, lingering, and aching pain. The pain is felt in the lower side of the abdomen or in the groin. It usually starts deep in the groin and moves up to the outside of the hip area. Pain can occur with: °· A sudden change in position. °· Rolling over in bed. °· Coughing or sneezing. °· Physical activity. ° °Follow these instructions at home: °Watch your condition for any changes. Take these steps to help with your pain: °· When the pain starts, relax. Then try: °? Sitting down. °? Flexing your knees up to your abdomen. °? Lying on your side with one pillow under your abdomen and another pillow between your legs. °? Sitting in a warm bath for 15-20 minutes or until the pain goes away. °· Take over-the-counter and prescription medicines only as told by your health care provider. °· Move slowly when you sit and stand. °· Avoid long walks if they cause pain. °· Stop or lessen your physical activities if they cause pain. ° °Contact a health care provider if: °· Your pain does not go away with treatment. °· You feel pain in your back that you did not have before. °· Your medicine is not helping. °Get help right away if: °· You develop a fever or chills. °· You develop uterine contractions. °· You develop vaginal bleeding. °· You develop nausea or vomiting. °· You develop diarrhea. °· You have pain when you urinate. °This information is not intended to replace advice given to you by your health care provider. Make sure you discuss any questions you have  with your health care provider. °Document Released: 06/17/2008 Document Revised: 02/14/2016 Document Reviewed: 11/15/2014 °Elsevier Interactive Patient Education © 2018 Elsevier Inc[employer] ° ° °Abdominal Pain During Pregnancy °Belly (abdominal) pain is common during pregnancy. Most of the time, it is not a serious problem. Other times, it can be a sign that something is wrong with the pregnancy. Always tell your doctor if you have belly pain. °Follow these instructions at home: °Monitor your belly pain for any changes. The following actions may help you feel better: °Do not have sex (intercourse) or put anything in your vagina until you feel better. °Rest until your pain stops. °Drink clear fluids if you feel sick to your stomach (nauseous). Do not eat solid food until you feel better. °Only take medicine as told by your doctor. °Keep all doctor visits as told. ° °Get help right away if: °You are bleeding, leaking fluid, or pieces of tissue come out of your vagina. °You have more pain or cramping. °You keep throwing up (vomiting). °You have pain when you pee (urinate) or have blood in your pee. °You have a fever. °You do not feel your baby moving as much. °You feel very weak or feel like passing out. °You have trouble breathing, with or without belly pain. °You have a very bad headache and belly pain. °You have fluid leaking from your vagina and belly pain. °You keep having   You have fluid leaking from your vagina and belly pain.  You keep having watery poop (diarrhea).  Your belly pain does not go away after resting, or the pain gets worse. This information is not intended to replace advice given to you by your health care provider. Make sure you discuss any questions you have with your health care provider. Document Released: 08/27/2009 Document Revised: 04/16/2016 Document Reviewed: 04/07/2013 Elsevier Interactive Patient Education  [employer]2018 [employer]

## 2018-01-06 NOTE — MAU Note (Signed)
Started throwing up around 1130 this morning, getting worse. Feels like she is having contractions and it is too early for that.

## 2018-01-06 NOTE — Progress Notes (Addendum)
G2P1 @ 16.[redacted] wksga. Here for vomiting. Denies diarrhea. abd pain started this morning around 1100 when waking up. 8/10 pain worse than ctx pain. States pinkish discharge. Has a 6415 month old.   [place] [facility] [place].   1730 Provider at bs reassessing. POC discussed. Ordered to call U/S for cervical length  1739: u/s called for cerivcal length. Will put in prelim result.

## 2018-01-06 NOTE — MAU Provider Note (Addendum)
History     CSN: [id number]  Arrival date and time: 01/06/18 1351   First Provider Initiated Contact with Patient 01/06/18 1448      Chief Complaint  Patient presents with  . Contractions  . Emesis   HPI   Ms.Ashlee Simmons is a 22 y.o. female G2P1001 @ 66w2dhere in MAU with abdominal pain that started early this morning. States she was sleeping on her abdomen and woke up to pain. The pain is located in the middle of her abdomen. The pain comes and goes. She has not taken anything for the pain. She saw some pink discharge in the middle of the night, none currently. Only saw it one time when she wiped. States the pain feels like contractions.   OB History    Gravida  2   Para  1   Term  1   Preterm      AB      Living  1     SAB      TAB      Ectopic      Multiple  0   Live Births  1           Past Medical History:  Diagnosis Date  . Anxiety   . Asthma   . Depression   . Hypothyroidism     Past Surgical History:  Procedure Laterality Date  . FEMUR SURGERY     plate in pelvis, rods in each femur  . HIP SURGERY    . MOUTH SURGERY      Family History  Problem Relation Age of Onset  . COPD Maternal Grandfather   . Alcohol abuse Neg Hx   . Arthritis Neg Hx   . Asthma Neg Hx   . Birth defects Neg Hx   . Cancer Neg Hx   . Depression Neg Hx   . Diabetes Neg Hx   . Drug abuse Neg Hx   . Early death Neg Hx   . Hearing loss Neg Hx   . Heart disease Neg Hx   . Hyperlipidemia Neg Hx   . Hypertension Neg Hx   . Kidney disease Neg Hx   . Learning disabilities Neg Hx   . Mental illness Neg Hx   . Mental retardation Neg Hx   . Miscarriages / Stillbirths Neg Hx   . Stroke Neg Hx   . Vision loss Neg Hx   . Varicose Veins Neg Hx   . Intellectual disability Neg Hx   . Obesity Neg Hx   . ADD / ADHD Neg Hx   . Anxiety disorder Neg Hx     Social History   Tobacco Use  . Smoking status: Former SResearch scientist (life sciences) . Smokeless tobacco: Former USystems developer .  Tobacco comment: No cigarettes/ smoke marjuana  Substance Use Topics  . Alcohol use: No    Comment: occ  . Drug use: Yes    Types: Marijuana    Comment: none with pregnancy    Allergies: No Known Allergies  Medications Prior to Admission  Medication Sig Dispense Refill Last Dose  . acetaminophen (TYLENOL) 500 MG tablet Take 1,000 mg by mouth every 6 (six) hours as needed for mild pain or headache.    Past Week at Unknown time  . diphenhydrAMINE (BENADRYL) 25 MG tablet Take 25 mg by mouth every 6 (six) hours as needed.   Past Week at Unknown time  . Prenatal Vit-Fe Fumarate-FA (PRENATAL MULTIVITAMIN) TABS tablet Take 1 tablet by  mouth daily at 12 noon.   01/06/2018 at Unknown time  . albuterol (PROVENTIL HFA;VENTOLIN HFA) 108 (90 Base) MCG/ACT inhaler Inhale 1-2 puffs into the lungs every 6 (six) hours as needed for wheezing or shortness of breath.   Taking   Results for orders placed or performed during the hospital encounter of 01/06/18 (from the past 48 hour(s))  Urinalysis, Routine w reflex microscopic     Status: Abnormal   Collection Time: 01/06/18  2:07 PM  Result Value Ref Range   Color, Urine YELLOW YELLOW   APPearance HAZY (A) CLEAR   Specific Gravity, Urine 1.018 1.005 - 1.030   pH 5.0 5.0 - 8.0   Glucose, UA NEGATIVE NEGATIVE mg/dL   Hgb urine dipstick NEGATIVE NEGATIVE   Bilirubin Urine NEGATIVE NEGATIVE   Ketones, ur NEGATIVE NEGATIVE mg/dL   Protein, ur NEGATIVE NEGATIVE mg/dL   Nitrite NEGATIVE NEGATIVE   Leukocytes, UA LARGE (A) NEGATIVE   RBC / HPF 0-5 0 - 5 RBC/hpf   WBC, UA 0-5 0 - 5 WBC/hpf   Bacteria, UA NONE SEEN NONE SEEN   Squamous Epithelial / LPF 6-30 (A) NONE SEEN   Mucus PRESENT     Comment: Performed at [facility], [address], [place], [place] [address]  Wet prep, genital     Status: Abnormal   Collection Time: 01/06/18  3:00 PM  Result Value Ref Range   Yeast Wet Prep HPF POC NONE SEEN NONE SEEN   Trich, Wet Prep NONE SEEN NONE SEEN    Clue Cells Wet Prep HPF POC PRESENT (A) NONE SEEN   WBC, Wet Prep HPF POC MANY (A) NONE SEEN   Sperm NONE SEEN     Comment: Performed at [facility], [address] [place], [place] [address]  CBC with Differential     Status: Abnormal   Collection Time: 01/06/18  3:08 PM  Result Value Ref Range   WBC 8.1 4.0 - 10.5 K/uL   RBC 4.50 3.87 - 5.11 MIL/uL   Hemoglobin 13.8 12.0 - 15.0 g/dL   HCT 38.2 36.0 - 46.0 %   MCV 84.9 78.0 - 100.0 fL   MCH 30.7 26.0 - 34.0 pg   MCHC 36.1 (H) 30.0 - 36.0 g/dL   RDW 12.4 11.5 - 15.5 %   Platelets 159 150 - 400 K/uL   Neutrophils Relative % 82 %   Neutro Abs 6.6 1.7 - 7.7 K/uL   Lymphocytes Relative 14 %   Lymphs Abs 1.2 0.7 - 4.0 K/uL   Monocytes Relative 3 %   Monocytes Absolute 0.2 0.1 - 1.0 K/uL   Eosinophils Relative 1 %   Eosinophils Absolute 0.1 0.0 - 0.7 K/uL   Basophils Relative 0 %   Basophils Absolute 0.0 0.0 - 0.1 K/uL    Comment: Performed at [facility], [address] [place],  [address]  Comprehensive metabolic panel     Status: Abnormal   Collection Time: 01/06/18  3:08 PM  Result Value Ref Range   Sodium 136 135 - 145 mmol/L   Potassium 4.0 3.5 - 5.1 mmol/L   Chloride 107 101 - 111 mmol/L   CO2 19 (L) 22 - 32 mmol/L   Glucose, Bld 89 65 - 99 mg/dL   BUN 5 (L) 6 - 20 mg/dL   Creatinine, Ser 0.34 (L) 0.44 - 1.00 mg/dL   Calcium 8.7 (L) 8.9 - 10.3 mg/dL   Total Protein 6.7 6.5 - 8.1 g/dL   Albumin 3.8 3.5 -  5.0 g/dL   AST 16 15 - 41 U/L   ALT 16 14 - 54 U/L   Alkaline Phosphatase 70 38 - 126 U/L   Total Bilirubin 0.5 0.3 - 1.2 mg/dL   GFR calc non Af Amer >60 >60 mL/min   GFR calc Af Amer >60 >60 mL/min    Comment: (NOTE) The eGFR has been calculated using the CKD EPI equation. This calculation has not been validated in all clinical situations. eGFR's persistently <60 mL/min signify possible Chronic Kidney Disease.    Anion gap 10 5 - 15    Comment: Performed at [facility], [address] [place], [place] [address]  Lipase, blood     Status: None   Collection Time: 01/06/18  3:08 PM  Result Value Ref Range   Lipase 26 11 - 51 U/L    Comment: Performed at [facility], [address], [place], [place] [address]    Review of Systems  Constitutional: Negative for fever.  Gastrointestinal: Positive for abdominal pain, nausea and vomiting.  Genitourinary: Positive for pelvic pain. Negative for dysuria.   Physical Exam   Blood pressure 107/67, pulse 99, temperature 98.2 F (36.8 C), temperature source Oral, resp. rate 18, height '5\' 2"'$  (1.575 m), weight 173 lb 4 oz (78.6 kg), last menstrual period 09/14/2017, SpO2 99 %, unknown if currently breastfeeding.  Physical Exam  Constitutional: She is oriented to person, place, and time. She appears well-developed and well-nourished. No distress.  HENT:  Head: Normocephalic.  Eyes: Pupils are equal, round, and reactive to light.  Respiratory: Effort normal.  GI: There is tenderness in the periumbilical area. There is no rigidity, no rebound and no guarding.  Genitourinary:  Genitourinary Comments: Dilation: FT Effacement (%): Thick Exam by:: rasch,np  Musculoskeletal: Normal range of motion.  Neurological: She is alert and oriented to person, place, and time.  Skin: Skin is warm. She is not diaphoretic.  Psychiatric: Her behavior is normal.   MAU Course  Procedures  None  MDM  + fetal heart tones via doppler  Toradol given 60 mg IM [place] for cervical length  Awaiting [place] results. Report given to D. Mayre Bury CNM who resumes care of the patient.  Rasch, Artist Pais, NP   Assumed care at 1730: Pt states pain much improved to 3/10. Also reports pain is worse with walking and moving. Short interval between pregnnacies so c/w RLP. Discussed relief measures  [place] Mfm Ob Limited  Result Date: 01/06/2018 ----------------------------------------------------------------------  OBSTETRICS REPORT                      (Signed Final  01/06/2018 05:14 pm) ---------------------------------------------------------------------- Patient Info  ID #:       [id number]                          D.O.B.:  Mar 16, 1996 (22 yrs)  Name:       Ashlee Simmons                  Visit Date: 01/06/2018 04:13 pm ---------------------------------------------------------------------- Performed By  Performed By:     Enriqueta Shutter           Ref. Address:     [place], [address]  [address] Pall                                                             [phone]  Attending:        Seward Meth MD         Location:         [facility]  Referred By:      Ilona Sorrel NP ---------------------------------------------------------------------- Orders   #  Description                                 Code   1  [place] MFM OB LIMITED                           [phone]  ----------------------------------------------------------------------   #  Ordered By               Order #        Accession #    Episode #   1  Noni Saupe           [id number]      [id number]     [id number]  ---------------------------------------------------------------------- Indications   [redacted] weeks gestation of pregnancy                Z3A.16   Abdominal pain in pregnancy                    O99.89  ---------------------------------------------------------------------- OB History  Gravidity:    2         Term:   1        Prem:   0        SAB:   0  TOP:          0       Ectopic:  0        Living: 1 ---------------------------------------------------------------------- Fetal Evaluation  Num Of Fetuses:     1  Fetal Heart         143  Rate(bpm):  Cardiac Activity:   Observed  Presentation:       Transverse, head to maternal left  Placenta:           Anterior, above cervical os  Amniotic Fluid  AFI FV:      Subjectively within normal limits                              Largest  Pocket(cm)                              5.76 ---------------------------------------------------------------------- Gestational Age  LMP:           16w 2d        Date:  09/14/17  EDD:   06/21/18  Best:          16w 2d     Det. By:  LMP  (09/14/17)          EDD:   06/21/18 ---------------------------------------------------------------------- Cervix Uterus Adnexa  Cervix  Length:            4.2  cm.  Normal appearance by transabdominal scan. ---------------------------------------------------------------------- Impression  Single living intrauterine pregnancy at 16w 2d.  Transverse, head to maternal left presentation.  Placenta Anterior, above cervical os.  Normal amniotic fluid volume.  No sonographic evidence of intrauterine bleed  The cervix measures 4.2 cm transvaginally without funneling  or debris. ---------------------------------------------------------------------- Recommendations  Follow-up ultrasounds as clinically indicated. ----------------------------------------------------------------------                   Seward Meth, MD Electronically Signed Final Report   01/06/2018 05:14 pm ----------------------------------------------------------------------  Results for orders placed or performed during the hospital encounter of 01/06/18 (from the past 24 hour(s))  Urinalysis, Routine w reflex microscopic     Status: Abnormal   Collection Time: 01/06/18  2:07 PM  Result Value Ref Range   Color, Urine YELLOW YELLOW   APPearance HAZY (A) CLEAR   Specific Gravity, Urine 1.018 1.005 - 1.030   pH 5.0 5.0 - 8.0   Glucose, UA NEGATIVE NEGATIVE mg/dL   Hgb urine dipstick NEGATIVE NEGATIVE   Bilirubin Urine NEGATIVE NEGATIVE   Ketones, ur NEGATIVE NEGATIVE mg/dL   Protein, ur NEGATIVE NEGATIVE mg/dL   Nitrite NEGATIVE NEGATIVE   Leukocytes, UA LARGE (A) NEGATIVE   RBC / HPF 0-5 0 - 5 RBC/hpf   WBC, UA 0-5 0 - 5 WBC/hpf   Bacteria, UA NONE SEEN NONE SEEN   Squamous Epithelial / LPF  6-30 (A) NONE SEEN   Mucus PRESENT   Wet prep, genital     Status: Abnormal   Collection Time: 01/06/18  3:00 PM  Result Value Ref Range   Yeast Wet Prep HPF POC NONE SEEN NONE SEEN   Trich, Wet Prep NONE SEEN NONE SEEN   Clue Cells Wet Prep HPF POC PRESENT (A) NONE SEEN   WBC, Wet Prep HPF POC MANY (A) NONE SEEN   Sperm NONE SEEN   CBC with Differential     Status: Abnormal   Collection Time: 01/06/18  3:08 PM  Result Value Ref Range   WBC 8.1 4.0 - 10.5 K/uL   RBC 4.50 3.87 - 5.11 MIL/uL   Hemoglobin 13.8 12.0 - 15.0 g/dL   HCT 38.2 36.0 - 46.0 %   MCV 84.9 78.0 - 100.0 fL   MCH 30.7 26.0 - 34.0 pg   MCHC 36.1 (H) 30.0 - 36.0 g/dL   RDW 12.4 11.5 - 15.5 %   Platelets 159 150 - 400 K/uL   Neutrophils Relative % 82 %   Neutro Abs 6.6 1.7 - 7.7 K/uL   Lymphocytes Relative 14 %   Lymphs Abs 1.2 0.7 - 4.0 K/uL   Monocytes Relative 3 %   Monocytes Absolute 0.2 0.1 - 1.0 K/uL   Eosinophils Relative 1 %   Eosinophils Absolute 0.1 0.0 - 0.7 K/uL   Basophils Relative 0 %   Basophils Absolute 0.0 0.0 - 0.1 K/uL  Comprehensive metabolic panel     Status: Abnormal   Collection Time: 01/06/18  3:08 PM  Result Value Ref Range   Sodium 136 135 - 145 mmol/L   Potassium 4.0 3.5 - 5.1 mmol/L  Chloride 107 101 - 111 mmol/L   CO2 19 (L) 22 - 32 mmol/L   Glucose, Bld 89 65 - 99 mg/dL   BUN 5 (L) 6 - 20 mg/dL   Creatinine, Ser 0.34 (L) 0.44 - 1.00 mg/dL   Calcium 8.7 (L) 8.9 - 10.3 mg/dL   Total Protein 6.7 6.5 - 8.1 g/dL   Albumin 3.8 3.5 - 5.0 g/dL   AST 16 15 - 41 U/L   ALT 16 14 - 54 U/L   Alkaline Phosphatase 70 38 - 126 U/L   Total Bilirubin 0.5 0.3 - 1.2 mg/dL   GFR calc non Af Amer >60 >60 mL/min   GFR calc Af Amer >60 >60 mL/min   Anion gap 10 5 - 15  Lipase, blood     Status: None   Collection Time: 01/06/18  3:08 PM  Result Value Ref Range   Lipase 26 11 - 51 U/L     Assessment and Plan   1. Abdominal pain during pregnancy in second trimester   2. [redacted] weeks  gestation of pregnancy   3. Abdominal pain affecting pregnancy   4. Gas pain   5. Round ligament pain    Allergies as of 01/06/2018   No Known Allergies     Medication List    STOP taking these medications   diphenhydrAMINE 25 MG tablet Commonly known as:  BENADRYL     TAKE these medications   acetaminophen 500 MG tablet Commonly known as:  TYLENOL Take 1,000 mg by mouth every 6 (six) hours as needed for mild pain or headache.   albuterol 108 (90 Base) MCG/ACT inhaler Commonly known as:  PROVENTIL HFA;VENTOLIN HFA Inhale 1-2 puffs into the lungs every 6 (six) hours as needed for wheezing or shortness of breath.   prenatal multivitamin Tabs tablet Take 1 tablet by mouth daily at 12 noon.   simethicone 80 MG chewable tablet Commonly known as:  GAS-X Chew 1 tablet (80 mg total) by mouth every 6 (six) hours as needed for flatulence.      Follow-up Information    Department, [facility] Follow up on 01/25/2018.   Contact information: [place] [address] [phone]         Lorene Dy, CNM 01/06/2018 6:06 PM

## 2018-01-07 LAB — GC/CHLAMYDIA PROBE AMP (~~LOC~~) NOT AT ARMC
Chlamydia: NEGATIVE
Neisseria Gonorrhea: NEGATIVE

## 2018-02-01 ENCOUNTER — Inpatient Hospital Stay (HOSPITAL_COMMUNITY)
Admission: AD | Admit: 2018-02-01 | Discharge: 2018-02-01 | Disposition: A | Discharge status: Home / Self Care | Payer: Medicaid Other | Source: Ambulatory Visit | Attending: Obstetrics and Gynecology | Admitting: Obstetrics and Gynecology

## 2018-02-01 DIAGNOSIS — Z3A20 20 weeks gestation of pregnancy: Secondary | ICD-10-CM | POA: Diagnosis not present

## 2018-02-01 DIAGNOSIS — O23592 Infection of other part of genital tract in pregnancy, second trimester: Secondary | ICD-10-CM | POA: Insufficient documentation

## 2018-02-01 DIAGNOSIS — O26892 Other specified pregnancy related conditions, second trimester: Secondary | ICD-10-CM | POA: Insufficient documentation

## 2018-02-01 DIAGNOSIS — Z87891 Personal history of nicotine dependence: Secondary | ICD-10-CM | POA: Diagnosis not present

## 2018-02-01 DIAGNOSIS — O99512 Diseases of the respiratory system complicating pregnancy, second trimester: Secondary | ICD-10-CM | POA: Insufficient documentation

## 2018-02-01 DIAGNOSIS — R3915 Urgency of urination: Secondary | ICD-10-CM | POA: Diagnosis not present

## 2018-02-01 DIAGNOSIS — J45909 Unspecified asthma, uncomplicated: Secondary | ICD-10-CM | POA: Diagnosis not present

## 2018-02-01 DIAGNOSIS — B9689 Other specified bacterial agents as the cause of diseases classified elsewhere: Secondary | ICD-10-CM | POA: Diagnosis not present

## 2018-02-01 DIAGNOSIS — N898 Other specified noninflammatory disorders of vagina: Secondary | ICD-10-CM

## 2018-02-01 DIAGNOSIS — O9989 Other specified diseases and conditions complicating pregnancy, childbirth and the puerperium: Secondary | ICD-10-CM

## 2018-02-01 DIAGNOSIS — N760 Acute vaginitis: Secondary | ICD-10-CM

## 2018-02-01 LAB — URINALYSIS, ROUTINE W REFLEX MICROSCOPIC
BILIRUBIN URINE: NEGATIVE
Glucose, UA: NEGATIVE mg/dL
HGB URINE DIPSTICK: NEGATIVE
Ketones, ur: NEGATIVE mg/dL
Nitrite: NEGATIVE
PROTEIN: NEGATIVE mg/dL
Specific Gravity, Urine: 1.028 (ref 1.005–1.030)
pH: 5 (ref 5.0–8.0)

## 2018-02-01 MED ORDER — METRONIDAZOLE 0.75 % VA GEL: 1.0000 | Freq: Every day | VAGINAL | 0 refills | Status: AC

## 2018-02-01 NOTE — MAU Provider Note (Addendum)
History     CSN: [id number]  Arrival date and time: 02/01/18 4098   First Provider Initiated Contact with Patient 02/01/18 2012      Chief Complaint  Patient presents with  . Vaginal Discharge   G2P1001 .0 wks here with urinary urgency and vaginal irritation. Sx started 4 weeks ago. No hematuria, polyuria, and dysuria. She started using a new soap before sx started. No lesion or sores. She has a mucous discharge w/o odor. No itching. No new partner. She was treated for BV at the health dept and is vomiting after each dose of Flagyl. Had 4 doses total. States "If I'm not admitted, I need a work note".   OB History    Gravida  2   Para  1   Term  1   Preterm      AB      Living  1     SAB      TAB      Ectopic      Multiple  0   Live Births  1           Past Medical History:  Diagnosis Date  . Anxiety   . Asthma   . Depression   . Hypothyroidism     Past Surgical History:  Procedure Laterality Date  . FEMUR SURGERY     plate in pelvis, rods in each femur  . HIP SURGERY    . MOUTH SURGERY      Family History  Problem Relation Age of Onset  . COPD Maternal Grandfather   . Alcohol abuse Neg Hx   . Arthritis Neg Hx   . Asthma Neg Hx   . Birth defects Neg Hx   . Cancer Neg Hx   . Depression Neg Hx   . Diabetes Neg Hx   . Drug abuse Neg Hx   . Early death Neg Hx   . Hearing loss Neg Hx   . Heart disease Neg Hx   . Hyperlipidemia Neg Hx   . Hypertension Neg Hx   . Kidney disease Neg Hx   . Learning disabilities Neg Hx   . Mental illness Neg Hx   . Mental retardation Neg Hx   . Miscarriages / Stillbirths Neg Hx   . Stroke Neg Hx   . Vision loss Neg Hx   . Varicose Veins Neg Hx   . Intellectual disability Neg Hx   . Obesity Neg Hx   . ADD / ADHD Neg Hx   . Anxiety disorder Neg Hx     Social History   Tobacco Use  . Smoking status: Former Games developer  . Smokeless tobacco: Former Neurosurgeon  . Tobacco comment: No cigarettes/ smoke marjuana   Substance Use Topics  . Alcohol use: No    Comment: occ  . Drug use: Yes    Types: Marijuana    Comment: none with pregnancy    Allergies: No Known Allergies  No medications prior to admission.    Review of Systems  Constitutional: Negative for fever.  Gastrointestinal: Positive for vomiting. Negative for abdominal pain and diarrhea.  Genitourinary: Positive for urgency and vaginal discharge. Negative for dysuria, frequency, hematuria and vaginal bleeding.  Musculoskeletal: Negative for back pain.   Physical Exam   Blood pressure 109/60, pulse 91, temperature 98.2 F (36.8 C), temperature source Oral, resp. rate 18, height  (1.575 m), weight 175 lb (79.4 kg), last menstrual period 09/14/2017, SpO2 97 %, unknown if currently breastfeeding.  Physical Exam  Constitutional: She is oriented to person, place, and time. She appears well-developed and well-nourished. No distress.  HENT:  Head: Normocephalic and atraumatic.  Neck: Normal range of motion.  Respiratory: Effort normal. No respiratory distress.  GI: Soft. She exhibits no distension and no mass. There is no tenderness. There is no rebound, no guarding and no CVA tenderness.  Musculoskeletal: Normal range of motion.       Cervical back: Normal.       Thoracic back: Normal.       Lumbar back: Normal.  Neurological: She is alert and oriented to person, place, and time.  Skin: Skin is warm and dry.  Psychiatric: She has a normal mood and affect.  FHT 135  Results for orders placed or performed during the hospital encounter of 02/01/18 (from the past 24 hour(s))  Urinalysis, Routine w reflex microscopic     Status: Abnormal   Collection Time: 02/01/18  8:27 PM  Result Value Ref Range   Color, Urine YELLOW YELLOW   APPearance HAZY (A) CLEAR   Specific Gravity, Urine 1.028 1.005 - 1.030   pH 5.0 5.0 - 8.0   Glucose, UA NEGATIVE NEGATIVE mg/dL   Hgb urine dipstick NEGATIVE NEGATIVE   Bilirubin Urine NEGATIVE  NEGATIVE   Ketones, ur NEGATIVE NEGATIVE mg/dL   Protein, ur NEGATIVE NEGATIVE mg/dL   Nitrite NEGATIVE NEGATIVE   Leukocytes, UA TRACE (A) NEGATIVE   WBC, UA 6-10 0 - 5 WBC/hpf   Bacteria, UA RARE (A) NONE SEEN   Squamous Epithelial / LPF 21-50 0 - 5   Mucus PRESENT    MAU Course  Procedures  MDM Labs ordered and reviewed. No obvious UTI, will send UC. Wet prep not repeated- recent result showed clue cells. Will treat with Metrogel. Tub soaks with baking soda. Stable for discharge home.  Assessment and Plan   1. [redacted] weeks gestation of pregnancy   2. Bacterial vaginosis   3. Urinary urgency    Discharge home Follow up at [facility] as scheduled Rx Metrogel Return to MAU for OBGYN emergencies Work note provided-no restrictions to return  Allergies as of 02/01/2018   No Known Allergies     Medication List    TAKE these medications   acetaminophen 500 MG tablet Commonly known as:  TYLENOL Take 1,000 mg by mouth every 6 (six) hours as needed for mild pain or headache.   albuterol 108 (90 Base) MCG/ACT inhaler Commonly known as:  PROVENTIL HFA;VENTOLIN HFA Inhale 1-2 puffs into the lungs every 6 (six) hours as needed for wheezing or shortness of breath.   metroNIDAZOLE 0.75 % vaginal gel Commonly known as:  METROGEL Place 1 Applicatorful vaginally at bedtime for 5 days.   prenatal multivitamin Tabs tablet Take 1 tablet by mouth daily at 12 noon.   simethicone 80 MG chewable tablet Commonly known as:  GAS-X Chew 1 tablet (80 mg total) by mouth every 6 (six) hours as needed for flatulence.      Donette Larry, CNM 02/01/2018, 9:17 PM

## 2018-02-01 NOTE — Discharge Instructions (Signed)

## 2018-02-01 NOTE — MAU Note (Addendum)
Pt states she was diagnosed with BV on 5/6. States she was prescribed metronidazole. States she took 2 days worth of the medication and she vomited every time she took it. States she is very irritated and her perineum feels raw.

## 2018-02-03 LAB — CULTURE, OB URINE

## 2018-04-02 ENCOUNTER — Encounter (HOSPITAL_COMMUNITY): Payer: Self-pay | Admitting: Emergency Medicine

## 2018-04-02 ENCOUNTER — Emergency Department (HOSPITAL_COMMUNITY)
Admission: EM | Admit: 2018-04-02 | Discharge: 2018-04-03 | Discharge status: Against Medical Advice | Payer: Medicaid Other | Attending: Emergency Medicine | Admitting: Emergency Medicine

## 2018-04-02 ENCOUNTER — Other Ambulatory Visit: Payer: Self-pay

## 2018-04-02 ENCOUNTER — Emergency Department (HOSPITAL_COMMUNITY): Payer: Medicaid Other

## 2018-04-02 DIAGNOSIS — Z79899 Other long term (current) drug therapy: Secondary | ICD-10-CM | POA: Insufficient documentation

## 2018-04-02 DIAGNOSIS — O26893 Other specified pregnancy related conditions, third trimester: Secondary | ICD-10-CM | POA: Insufficient documentation

## 2018-04-02 DIAGNOSIS — Z3A28 28 weeks gestation of pregnancy: Secondary | ICD-10-CM | POA: Insufficient documentation

## 2018-04-02 DIAGNOSIS — E039 Hypothyroidism, unspecified: Secondary | ICD-10-CM | POA: Diagnosis not present

## 2018-04-02 DIAGNOSIS — J45909 Unspecified asthma, uncomplicated: Secondary | ICD-10-CM | POA: Insufficient documentation

## 2018-04-02 DIAGNOSIS — R51 Headache: Secondary | ICD-10-CM | POA: Diagnosis present

## 2018-04-02 DIAGNOSIS — Z87891 Personal history of nicotine dependence: Secondary | ICD-10-CM | POA: Insufficient documentation
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
## 2018-04-02 NOTE — ED Notes (Signed)
Pt eating chips in lobby

## 2018-04-02 NOTE — ED Notes (Signed)
Notified secretary Verlon AuLeslie to call [facility]

## 2018-04-03 NOTE — Progress Notes (Signed)
Patient signed out AMA.  EFM discontinued.
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
## 2018-04-03 NOTE — Progress Notes (Signed)
ED Provider in to talk to patient. Patient to sign out AMA.

## 2018-04-03 NOTE — Progress Notes (Signed)
Tct: Dr. Despina HiddenEure and updated on patient, EFM. Patient for 4 hours of fetal monitoring. Patient not agreeable to 4 hours of additiional monitoring and want to go home to sleep.  ED Provider and Dr. Despina HiddenEure made aware of patient not wanting to stay for additional monitoring.

## 2018-04-03 NOTE — ED Notes (Addendum)
This RN called [facility] and RN states that she was never contacted as comment on chart suggests. Sts she will be here in a little bit.

## 2018-04-03 NOTE — Discharge Instructions (Addendum)
Recommend that you stay to have your baby monitored in your blood pressure however I understand that you want to leave.  Please follow-up with your primary care and your OB/GYN doctor.  Return to the ED if you develop any abdominal pain, vaginal bleeding, lightheadedness, dizziness, syncope or for any other reason.  Take Tylenol for pain.

## 2018-04-19 ENCOUNTER — Encounter (HOSPITAL_COMMUNITY): Payer: Self-pay

## 2018-05-27 ENCOUNTER — Emergency Department (HOSPITAL_COMMUNITY)
Admission: EM | Admit: 2018-05-27 | Discharge: 2018-05-28 | Disposition: A | Discharge status: Home / Self Care | Payer: Medicaid Other | Attending: Emergency Medicine | Admitting: Emergency Medicine

## 2018-05-27 ENCOUNTER — Encounter (HOSPITAL_COMMUNITY): Payer: Self-pay | Admitting: Emergency Medicine

## 2018-05-27 DIAGNOSIS — W109XXA Fall (on) (from) unspecified stairs and steps, initial encounter: Secondary | ICD-10-CM | POA: Diagnosis not present

## 2018-05-27 DIAGNOSIS — S42402A Unspecified fracture of lower end of left humerus, initial encounter for closed fracture: Secondary | ICD-10-CM

## 2018-05-27 DIAGNOSIS — W19XXXA Unspecified fall, initial encounter: Secondary | ICD-10-CM

## 2018-05-27 DIAGNOSIS — O9989 Other specified diseases and conditions complicating pregnancy, childbirth and the puerperium: Secondary | ICD-10-CM | POA: Diagnosis present

## 2018-05-27 DIAGNOSIS — S52382A Bent bone of left radius, initial encounter for closed fracture: Secondary | ICD-10-CM

## 2018-05-27 DIAGNOSIS — Y929 Unspecified place or not applicable: Secondary | ICD-10-CM | POA: Insufficient documentation

## 2018-05-27 DIAGNOSIS — S52302A Unspecified fracture of shaft of left radius, initial encounter for closed fracture: Secondary | ICD-10-CM | POA: Diagnosis not present

## 2018-05-27 DIAGNOSIS — Y939 Activity, unspecified: Secondary | ICD-10-CM | POA: Insufficient documentation

## 2018-05-27 DIAGNOSIS — Z87891 Personal history of nicotine dependence: Secondary | ICD-10-CM | POA: Insufficient documentation

## 2018-05-27 DIAGNOSIS — Y998 Other external cause status: Secondary | ICD-10-CM | POA: Insufficient documentation

## 2018-05-27 DIAGNOSIS — Z3A37 37 weeks gestation of pregnancy: Secondary | ICD-10-CM | POA: Diagnosis not present

## 2018-05-27 DIAGNOSIS — E039 Hypothyroidism, unspecified: Secondary | ICD-10-CM | POA: Insufficient documentation

## 2018-05-27 DIAGNOSIS — O99513 Diseases of the respiratory system complicating pregnancy, third trimester: Secondary | ICD-10-CM | POA: Insufficient documentation

## 2018-05-27 NOTE — ED Triage Notes (Signed)
Pt states she fell down approx 5 steps. Reports L elbow/FA pain and L leg pain. Ambulatory.

## 2018-05-28 ENCOUNTER — Emergency Department (HOSPITAL_COMMUNITY): Payer: Medicaid Other

## 2018-05-28 ENCOUNTER — Encounter (HOSPITAL_COMMUNITY): Payer: Self-pay | Admitting: Emergency Medicine

## 2018-05-28 MED ORDER — OXYCODONE HCL 5 MG PO TABS
5.0000 mg | ORAL_TABLET | ORAL | Status: DC | PRN
  Administered 2018-05-28: 5 mg via ORAL
  Filled 2018-05-28: qty 1

## 2018-05-28 MED ORDER — HYDROCODONE-ACETAMINOPHEN 5-325 MG PO TABS: 1.0000 | ORAL_TABLET | Freq: Four times a day (QID) | ORAL | 0 refills | Status: DC | PRN

## 2018-05-28 MED ORDER — ACETAMINOPHEN 500 MG PO TABS
1000.0000 mg | ORAL_TABLET | Freq: Once | ORAL | Status: AC
  Administered 2018-05-28: 1000 mg via ORAL
  Filled 2018-05-28: qty 2

## 2018-05-28 NOTE — Progress Notes (Signed)
RROB at patients bedside who presents to [facility] ED with complaints of a fall this evening; Pt states she was going up the stairs carrying laundry and fell on her arm and towards her back; patient denies hitting her abdomen and denies concerns related to the pregnancy only complains of pain in her arm; EFM applied and assessing; Dr Macon Large made aware of patient's complaints and orders given for an OB limited ultrasound at this time; patient also taken to x-ray at this time

## 2018-05-28 NOTE — ED Notes (Signed)
Rapid OB called  

## 2018-05-28 NOTE — ED Provider Notes (Signed)
[facility] EMERGENCY DEPARTMENT Provider Note   CSN: [id number] Arrival date & time: 05/27/18  2231     History   Chief Complaint Chief Complaint  Patient presents with  . Fall    HPI Ashlee Simmons is a 22 y.o. female.  The history is provided by the patient.  Fall  This is a new problem. The current episode started 1 to 2 hours ago. The problem occurs constantly. The problem has not changed since onset.Pertinent negatives include no chest pain, no abdominal pain, no headaches and no shortness of breath. Nothing aggravates the symptoms. Nothing relieves the symptoms. She has tried nothing for the symptoms. The treatment provided no relief.  Extremity Pain  This is a new problem. The current episode started 1 to 2 hours ago. The problem occurs constantly. The problem has not changed since onset.Pertinent negatives include no chest pain, no abdominal pain, no headaches and no shortness of breath. Nothing aggravates the symptoms. She has tried nothing for the symptoms. The treatment provided no relief.  Fell down 5 steps onto left elbow.  Did not hit head, no LOC.  Did not strike abdomen.  No contractions no leakage of fluid.  Has broken that forearm before.    Past Medical History:  Diagnosis Date  . Anxiety   . Asthma   . Depression   . Hypothyroidism     Patient Active Problem List   Diagnosis Date Noted  . Pregnant and not yet delivered 10/16/2016    Past Surgical History:  Procedure Laterality Date  . FEMUR SURGERY     plate in pelvis, rods in each femur  . HIP SURGERY    . MOUTH SURGERY       OB History    Gravida  2   Para  1   Term  1   Preterm      AB      Living  1     SAB      TAB      Ectopic      Multiple  0   Live Births  1            Home Medications    Prior to Admission medications   Medication Sig Start Date End Date Taking? Authorizing Provider  HYDROcodone-acetaminophen (NORCO) 5-325 MG tablet Take 1  tablet by mouth every 6 (six) hours as needed for severe pain. 05/28/18   Carneshia Raker, MD  simethicone (GAS-X) 80 MG chewable tablet Chew 1 tablet (80 mg total) by mouth every 6 (six) hours as needed for flatulence. Patient not taking: Reported on 05/28/2018 01/06/18   Danae Orleans, CNM    Family History Family History  Problem Relation Age of Onset  . COPD Maternal Grandfather   . Alcohol abuse Neg Hx   . Arthritis Neg Hx   . Asthma Neg Hx   . Birth defects Neg Hx   . Cancer Neg Hx   . Depression Neg Hx   . Diabetes Neg Hx   . Drug abuse Neg Hx   . Early death Neg Hx   . Hearing loss Neg Hx   . Heart disease Neg Hx   . Hyperlipidemia Neg Hx   . Hypertension Neg Hx   . Kidney disease Neg Hx   . Learning disabilities Neg Hx   . Mental illness Neg Hx   . Mental retardation Neg Hx   . Miscarriages / Stillbirths Neg Hx   . Stroke  Neg Hx   . Vision loss Neg Hx   . Varicose Veins Neg Hx   . Intellectual disability Neg Hx   . Obesity Neg Hx   . ADD / ADHD Neg Hx   . Anxiety disorder Neg Hx     Social History Social History   Tobacco Use  . Smoking status: Former Games developer  . Smokeless tobacco: Former Neurosurgeon  . Tobacco comment: No cigarettes/ smoke marjuana  Substance Use Topics  . Alcohol use: No    Comment: occ  . Drug use: Yes    Types: Marijuana    Comment: none with pregnancy     Allergies   Patient has no known allergies.   Review of Systems Review of Systems  Eyes: Negative for visual disturbance.  Respiratory: Negative for shortness of breath.   Cardiovascular: Negative for chest pain.  Gastrointestinal: Negative for abdominal pain and nausea.  Genitourinary: Negative for flank pain, vaginal bleeding, vaginal discharge and vaginal pain.  Musculoskeletal: Positive for arthralgias and joint swelling. Negative for back pain and neck pain.  Neurological: Negative for speech difficulty, weakness and headaches.  All other systems reviewed and are  negative.    Physical Exam Updated Vital Signs BP 110/79 (BP Location: Right Arm)   Pulse 90   Temp 98.9 F (37.2 C) (Oral)   Resp 16   Ht 5\' 2"  (1.575 m)   Wt 83.9 kg   LMP 09/14/2017   SpO2 95%   BMI 33.84 kg/m   Physical Exam  Constitutional: She appears well-developed and well-nourished. No distress.  HENT:  Head: Normocephalic and atraumatic.  Right Ear: External ear normal.  Left Ear: External ear normal.  Nose: Nose normal.  Mouth/Throat: Oropharynx is clear and moist. No oropharyngeal exudate.  Eyes: Pupils are equal, round, and reactive to light. EOM are normal.  Neck: Normal range of motion. Neck supple.  Cardiovascular: Normal rate, regular rhythm, normal heart sounds and intact distal pulses.  Pulmonary/Chest: Effort normal and breath sounds normal. No stridor. She has no rales.  Abdominal: Soft. Bowel sounds are normal. She exhibits no mass. There is no tenderness. There is no rebound and no guarding.  Musculoskeletal:       Left shoulder: Normal.       Left elbow: She exhibits swelling. Tenderness found.       Left wrist: Normal.       Left hip: Normal.       Cervical back: Normal.       Thoracic back: Normal.       Left upper arm: Normal.       Left forearm: She exhibits tenderness. She exhibits no swelling, no edema, no deformity and no laceration.       Left hand: Normal. She exhibits normal capillary refill. Normal sensation noted. Normal strength noted.  No snuff box tenderness of the left wrist     ED Treatments / Results  Labs (all labs ordered are listed, but only abnormal results are displayed) Labs Reviewed - No data to display  EKG None  Radiology Dg Elbow Complete Left  Result Date: 05/28/2018 CLINICAL DATA:  Left elbow pain after a fall tonight. EXAM: LEFT ELBOW - COMPLETE 3+ VIEW; LEFT FOREARM - 2 VIEW COMPARISON:  None. FINDINGS: Three views of the left forearm and four views of the left elbow are obtained. Evaluation of the forearm  is somewhat limited due to patient position. There is a moderate left elbow effusion with elevation of the anterior and  posterior fat pads. No definite elbow fracture is identified but the presence of an effusion raises suspicion of an occult nondisplaced fracture. No dislocation. No expansile or destructive bone lesions. There is mild bowing deformity of the midshaft left radius. This could be due to patient position but may indicate a nondisplaced bowing fracture. Radius and ulna appear otherwise intact. IMPRESSION: Moderate left elbow effusion with elevation of the anterior and posterior fat pads. No definite elbow fracture is identified but the presence of an effusion raises suspicion of an occult nondisplaced elbow fracture. Bowing deformity of the midshaft left radius may be positional or may indicate a nondisplaced bowing type fracture. Electronically Signed   By: Burman Nieves M.D.   On: 05/28/2018 01:24   Dg Forearm Left  Result Date: 05/28/2018 CLINICAL DATA:  Left elbow pain after a fall tonight. EXAM: LEFT ELBOW - COMPLETE 3+ VIEW; LEFT FOREARM - 2 VIEW COMPARISON:  None. FINDINGS: Three views of the left forearm and four views of the left elbow are obtained. Evaluation of the forearm is somewhat limited due to patient position. There is a moderate left elbow effusion with elevation of the anterior and posterior fat pads. No definite elbow fracture is identified but the presence of an effusion raises suspicion of an occult nondisplaced fracture. No dislocation. No expansile or destructive bone lesions. There is mild bowing deformity of the midshaft left radius. This could be due to patient position but may indicate a nondisplaced bowing fracture. Radius and ulna appear otherwise intact. IMPRESSION: Moderate left elbow effusion with elevation of the anterior and posterior fat pads. No definite elbow fracture is identified but the presence of an effusion raises suspicion of an occult nondisplaced  elbow fracture. Bowing deformity of the midshaft left radius may be positional or may indicate a nondisplaced bowing type fracture. Electronically Signed   By: Burman Nieves M.D.   On: 05/28/2018 01:24   US Ob Limited  Result Date: 05/28/2018 CLINICAL DATA:  Patient fell this evening. EXAM: LIMITED OBSTETRIC ULTRASOUND FINDINGS: Number of Fetuses: 1 Heart Rate:  144 bpm Movement: Yes Presentation: Cephalic Placental Location: Anterior Previa: No Amniotic Fluid (Subjective):  Within normal limits. BPD: 9 point 1 1 cm 37 w  0 d MATERNAL FINDINGS: Cervix:  And unable to visualize Uterus/Adnexae: Ovaries unable to be visualized. IMPRESSION: Viable 37 week 0 day intrauterine gestation without acute abnormality. Subjectively normal amniotic fluid volume with anterior intact placenta. This exam is performed on an emergent basis and does not comprehensively evaluate fetal size, dating, or anatomy; follow-up complete OB US should be considered if further fetal assessment is warranted. Electronically Signed   By: Tollie Eth M.D.   On: 05/28/2018 01:46    Procedures Procedures (including critical care time)  Medications Ordered in ED Medications  oxyCODONE (Oxy IR/ROXICODONE) immediate release tablet 5 mg (5 mg Oral Given 05/28/18 0229)  acetaminophen (TYLENOL) tablet 1,000 mg (1,000 mg Oral Given 05/28/18 0038)     Rapid responsse monitored patient,    Final Clinical Impressions(s) / ED Diagnoses   Final diagnoses:  Elbow fracture, left, closed, initial encounter  Closed bent bone fracture of left radius, initial encounter   Placed in splint, will need close follow up with hand surgery.  Patient verbalizes understanding and agrees to follow up.    Return for fevers >100.4 unrelieved by medication, shortness of breath, intractable vomiting, or diarrhea, Inability to tolerate liquids or food, cough, altered mental status or any concerns. No signs of systemic illness  or infection. The patient is  nontoxic-appearing on exam and vital signs are within normal limits.   I have reviewed the triage vital signs and the nursing notes. Pertinent labs &imaging results that were available during my care of the patient were reviewed by me and considered in my medical decision making (see chart for details).  After history, exam, and medical workup I feel the patient has been appropriately medically screened and is safe for discharge home. Pertinent diagnoses were discussed with the patient. Patient was given return precautions.   ED Discharge Orders         Ordered    HYDROcodone-acetaminophen (NORCO) 5-325 MG tablet  Every 6 hours PRN     05/28/18 0247           Margart Zemanek, MD 05/28/18 1610

## 2018-05-28 NOTE — Progress Notes (Signed)
Dr Macon Large notified of U/S results and reactive NST; patient OB cleared at this time

## 2018-06-13 ENCOUNTER — Inpatient Hospital Stay (HOSPITAL_COMMUNITY)
Admission: AD | Admit: 2018-06-13 | Discharge: 2018-06-13 | Disposition: A | Discharge status: Home / Self Care | Payer: Medicaid Other | Source: Ambulatory Visit | Attending: Obstetrics and Gynecology | Admitting: Obstetrics and Gynecology

## 2018-06-13 ENCOUNTER — Encounter (HOSPITAL_COMMUNITY): Payer: Self-pay | Admitting: *Deleted

## 2018-06-13 DIAGNOSIS — Z3A38 38 weeks gestation of pregnancy: Secondary | ICD-10-CM | POA: Insufficient documentation

## 2018-06-13 DIAGNOSIS — M545 Low back pain: Secondary | ICD-10-CM | POA: Insufficient documentation

## 2018-06-13 DIAGNOSIS — M549 Dorsalgia, unspecified: Secondary | ICD-10-CM

## 2018-06-13 DIAGNOSIS — K5900 Constipation, unspecified: Secondary | ICD-10-CM

## 2018-06-13 DIAGNOSIS — O26893 Other specified pregnancy related conditions, third trimester: Secondary | ICD-10-CM | POA: Diagnosis present

## 2018-06-13 DIAGNOSIS — Z3493 Encounter for supervision of normal pregnancy, unspecified, third trimester: Secondary | ICD-10-CM

## 2018-06-13 NOTE — MAU Provider Note (Signed)
RN Labor Eval Call   Patient is a 22yo G2P1001 at 5236w6d who presents with low back pain, constipation, and contractions. Called by RN who performed labor check. Cervical exam 1-2, irregular contractions and appears comfortable per RN. Not in labor. Low back pain and constipation are chronic issues with this pregnancy, no acute changes. Signed in complaining of discharge, but she has only noticed increased mucous, denies ROM or any symptoms/discomfort related to discharge. Strip reviewed, reactive NST 130s/mod/+a/-d; irregular contractions. Patient has appointment with prenatal provider in the morning. RN reviewed labor and return precautions. I agree with RN documentation and plan of care.   Cristal DeerLaurel S. Earlene PlaterWallace, DO OB/GYN Fellow

## 2018-06-13 NOTE — Discharge Instructions (Signed)
Braxton Hicks Contractions °Contractions of the uterus can occur throughout pregnancy, but they are not always a sign that you are in labor. You may have practice contractions called Braxton Hicks contractions. These false labor contractions are sometimes confused with true labor. °What are Braxton Hicks contractions? °Braxton Hicks contractions are tightening movements that occur in the muscles of the uterus before labor. Unlike true labor contractions, these contractions do not result in opening (dilation) and thinning of the cervix. Toward the end of pregnancy (32-34 weeks), Braxton Hicks contractions can happen more often and may become stronger. These contractions are sometimes difficult to tell apart from true labor because they can be very uncomfortable. You should not feel embarrassed if you go to the hospital with false labor. °Sometimes, the only way to tell if you are in true labor is for your health care provider to look for changes in the cervix. The health care provider will do a physical exam and may monitor your contractions. If you are not in true labor, the exam should show that your cervix is not dilating and your water has not broken. °If there are other health problems associated with your pregnancy, it is completely safe for you to be sent home with false labor. You may continue to have Braxton Hicks contractions until you go into true labor. °How to tell the difference between true labor and false labor °True labor °· Contractions last 30-70 seconds. °· Contractions become very regular. °· Discomfort is usually felt in the top of the uterus, and it spreads to the lower abdomen and low back. °· Contractions do not go away with walking. °· Contractions usually become more intense and increase in frequency. °· The cervix dilates and gets thinner. °False labor °· Contractions are usually shorter and not as strong as true labor contractions. °· Contractions are usually irregular. °· Contractions  are often felt in the front of the lower abdomen and in the groin. °· Contractions may go away when you walk around or change positions while lying down. °· Contractions get weaker and are shorter-lasting as time goes on. °· The cervix usually does not dilate or become thin. °Follow these instructions at home: °· Take over-the-counter and prescription medicines only as told by your health care provider. °· Keep up with your usual exercises and follow other instructions from your health care provider. °· Eat and drink lightly if you think you are going into labor. °· If Braxton Hicks contractions are making you uncomfortable: °? Change your position from lying down or resting to walking, or change from walking to resting. °? Sit and rest in a tub of warm water. °? Drink enough fluid to keep your urine pale yellow. Dehydration may cause these contractions. °? Do slow and deep breathing several times an hour. °· Keep all follow-up prenatal visits as told by your health care provider. This is important. °Contact a health care provider if: °· You have a fever. °· You have continuous pain in your abdomen. °Get help right away if: °· Your contractions become stronger, more regular, and closer together. °· You have fluid leaking or gushing from your vagina. °· You pass blood-tinged mucus (bloody show). °· You have bleeding from your vagina. °· You have low back pain that you never had before. °· You feel your baby’s head pushing down and causing pelvic pressure. °· Your baby is not moving inside you as much as it used to. °Summary °· Contractions that occur before labor are called Braxton   Hicks contractions, false labor, or practice contractions. °· Braxton Hicks contractions are usually shorter, weaker, farther apart, and less regular than true labor contractions. True labor contractions usually become progressively stronger and regular and they become more frequent. °· Manage discomfort from Braxton Hicks contractions by  changing position, resting in a warm bath, drinking plenty of water, or practicing deep breathing. °This information is not intended to replace advice given to you by your health care provider. Make sure you discuss any questions you have with your health care provider. °Document Released: 01/22/2017 Document Revised: 01/22/2017 Document Reviewed: 01/22/2017 °Elsevier Interactive Patient Education © 2018 [employer] ° °

## 2018-06-13 NOTE — MAU Note (Addendum)
Pt presents with back pain and contractions. Having some mucous discharge, no leaking of fluid.

## 2018-06-13 NOTE — MAU Note (Signed)
I have communicated with Ashlee Simmons and reviewed vital signs: There were no vitals filed for this visit.  Vaginal exam:  Dilation: 1.5 Effacement (%): 60 Cervical Position: Posterior Station: -3 Exam by:: B Maston Wight RN,   Also reviewed contraction pattern and that non-stress test is reactive.  It has been documented that patient is contracting irregularly not indicating active labor.  Patient denies any other complaints.  Based on this report provider has given order for discharge.  A discharge order and diagnosis entered by a provider.   Labor discharge instructions reviewed with patient.

## 2018-06-19 ENCOUNTER — Inpatient Hospital Stay (HOSPITAL_COMMUNITY): Payer: Medicaid Other | Admitting: Anesthesiology

## 2018-06-19 ENCOUNTER — Encounter (HOSPITAL_COMMUNITY): Payer: Self-pay

## 2018-06-19 ENCOUNTER — Inpatient Hospital Stay (HOSPITAL_COMMUNITY)
Admission: AD | Admit: 2018-06-19 | Discharge: 2018-06-21 | DRG: 807 | Disposition: A | Discharge status: Home / Self Care | Payer: Medicaid Other | Attending: Family Medicine | Admitting: Family Medicine

## 2018-06-19 ENCOUNTER — Other Ambulatory Visit: Payer: Self-pay

## 2018-06-19 DIAGNOSIS — Z3483 Encounter for supervision of other normal pregnancy, third trimester: Secondary | ICD-10-CM | POA: Diagnosis present

## 2018-06-19 DIAGNOSIS — Z87891 Personal history of nicotine dependence: Secondary | ICD-10-CM

## 2018-06-19 DIAGNOSIS — Z3A39 39 weeks gestation of pregnancy: Secondary | ICD-10-CM | POA: Diagnosis not present

## 2018-06-19 LAB — URINALYSIS, ROUTINE W REFLEX MICROSCOPIC
Bilirubin Urine: NEGATIVE
GLUCOSE, UA: NEGATIVE mg/dL
KETONES UR: NEGATIVE mg/dL
NITRITE: NEGATIVE
PROTEIN: NEGATIVE mg/dL
Specific Gravity, Urine: 1.003 — ABNORMAL LOW (ref 1.005–1.030)
pH: 7 (ref 5.0–8.0)

## 2018-06-19 LAB — CBC
HCT: 34.4 % — ABNORMAL LOW (ref 36.0–46.0)
HEMATOCRIT: 35 % — AB (ref 36.0–46.0)
HEMOGLOBIN: 11.7 g/dL — AB (ref 12.0–15.0)
HEMOGLOBIN: 11.3 g/dL — AB (ref 12.0–15.0)
MCH: 28.5 pg (ref 26.0–34.0)
MCH: 27.8 pg (ref 26.0–34.0)
MCHC: 33.4 g/dL (ref 30.0–36.0)
MCHC: 32.8 g/dL (ref 30.0–36.0)
MCV: 85.2 fL (ref 78.0–100.0)
MCV: 84.7 fL (ref 78.0–100.0)
PLATELETS: 129 10*3/uL — AB (ref 150–400)
Platelets: 112 10*3/uL — ABNORMAL LOW (ref 150–400)
RBC: 4.11 MIL/uL (ref 3.87–5.11)
RBC: 4.06 MIL/uL (ref 3.87–5.11)
RDW: 13.4 % (ref 11.5–15.5)
RDW: 13.2 % (ref 11.5–15.5)
WBC: 9.6 10*3/uL (ref 4.0–10.5)
WBC: 9.8 10*3/uL (ref 4.0–10.5)

## 2018-06-19 LAB — TYPE AND SCREEN
ABO/RH(D): O POS
Antibody Screen: NEGATIVE

## 2018-06-19 MED ORDER — ZOLPIDEM TARTRATE 5 MG PO TABS: 5.0000 mg | ORAL_TABLET | Freq: Every evening | ORAL | Status: DC | PRN

## 2018-06-19 MED ORDER — EPHEDRINE 5 MG/ML INJ
10.0000 mg | INTRAVENOUS | Status: DC | PRN
  Filled 2018-06-19: qty 2

## 2018-06-19 MED ORDER — TETANUS-DIPHTH-ACELL PERTUSSIS 5-2.5-18.5 LF-MCG/0.5 IM SUSP: 0.5000 mL | Freq: Once | INTRAMUSCULAR | Status: DC

## 2018-06-19 MED ORDER — ACETAMINOPHEN 325 MG PO TABS
650.0000 mg | ORAL_TABLET | ORAL | Status: DC | PRN
  Administered 2018-06-20: 650 mg via ORAL
  Filled 2018-06-19: qty 2

## 2018-06-19 MED ORDER — PRENATAL MULTIVITAMIN CH
1.0000 | ORAL_TABLET | Freq: Every day | ORAL | Status: DC
  Administered 2018-06-20 – 2018-06-21 (×2): 1 via ORAL
  Filled 2018-06-19 (×2): qty 1

## 2018-06-19 MED ORDER — FENTANYL CITRATE (PF) 100 MCG/2ML IJ SOLN
100.0000 ug | INTRAMUSCULAR | Status: DC | PRN
  Administered 2018-06-19: 100 ug via INTRAVENOUS
  Filled 2018-06-19: qty 2

## 2018-06-19 MED ORDER — LACTATED RINGERS IV SOLN: 500.0000 mL | Freq: Once | INTRAVENOUS | Status: DC

## 2018-06-19 MED ORDER — OXYTOCIN 40 UNITS IN LACTATED RINGERS INFUSION - SIMPLE MED
2.5000 [IU]/h | INTRAVENOUS | Status: DC
  Filled 2018-06-19: qty 1000

## 2018-06-19 MED ORDER — COCONUT OIL OIL: 1.0000 "application " | TOPICAL_OIL | Status: DC | PRN

## 2018-06-19 MED ORDER — ONDANSETRON HCL 4 MG PO TABS: 4.0000 mg | ORAL_TABLET | ORAL | Status: DC | PRN

## 2018-06-19 MED ORDER — PHENYLEPHRINE 40 MCG/ML (10ML) SYRINGE FOR IV PUSH (FOR BLOOD PRESSURE SUPPORT)
80.0000 ug | PREFILLED_SYRINGE | INTRAVENOUS | Status: DC | PRN
  Filled 2018-06-19: qty 5

## 2018-06-19 MED ORDER — SENNOSIDES-DOCUSATE SODIUM 8.6-50 MG PO TABS
2.0000 | ORAL_TABLET | ORAL | Status: DC
  Administered 2018-06-19 – 2018-06-21 (×2): 2 via ORAL
  Filled 2018-06-19 (×2): qty 2

## 2018-06-19 MED ORDER — DIPHENHYDRAMINE HCL 25 MG PO CAPS: 25.0000 mg | ORAL_CAPSULE | Freq: Four times a day (QID) | ORAL | Status: DC | PRN

## 2018-06-19 MED ORDER — FENTANYL CITRATE (PF) 100 MCG/2ML IJ SOLN
50.0000 ug | Freq: Once | INTRAMUSCULAR | Status: AC
  Administered 2018-06-19: 50 ug via INTRAMUSCULAR
  Filled 2018-06-19: qty 2

## 2018-06-19 MED ORDER — FENTANYL 2.5 MCG/ML BUPIVACAINE 1/10 % EPIDURAL INFUSION (WH - ANES)
14.0000 mL/h | INTRAMUSCULAR | Status: DC | PRN
  Administered 2018-06-19: 14 mL/h via EPIDURAL

## 2018-06-19 MED ORDER — BENZOCAINE-MENTHOL 20-0.5 % EX AERO: 1.0000 "application " | INHALATION_SPRAY | CUTANEOUS | Status: DC | PRN

## 2018-06-19 MED ORDER — DIBUCAINE 1 % RE OINT: 1.0000 "application " | TOPICAL_OINTMENT | RECTAL | Status: DC | PRN

## 2018-06-19 MED ORDER — LACTATED RINGERS IV SOLN
INTRAVENOUS | Status: DC
  Administered 2018-06-19: 09:00:00 via INTRAVENOUS

## 2018-06-19 MED ORDER — OXYCODONE HCL 5 MG PO TABS: 10.0000 mg | ORAL_TABLET | ORAL | Status: DC | PRN

## 2018-06-19 MED ORDER — PHENYLEPHRINE 40 MCG/ML (10ML) SYRINGE FOR IV PUSH (FOR BLOOD PRESSURE SUPPORT)
PREFILLED_SYRINGE | INTRAVENOUS | Status: AC
  Filled 2018-06-19: qty 10

## 2018-06-19 MED ORDER — FENTANYL 2.5 MCG/ML BUPIVACAINE 1/10 % EPIDURAL INFUSION (WH - ANES)
INTRAMUSCULAR | Status: AC
  Filled 2018-06-19: qty 100

## 2018-06-19 MED ORDER — SOD CITRATE-CITRIC ACID 500-334 MG/5ML PO SOLN: 30.0000 mL | ORAL | Status: DC | PRN

## 2018-06-19 MED ORDER — WITCH HAZEL-GLYCERIN EX PADS: 1.0000 "application " | MEDICATED_PAD | CUTANEOUS | Status: DC | PRN

## 2018-06-19 MED ORDER — LACTATED RINGERS IV SOLN: 500.0000 mL | INTRAVENOUS | Status: DC | PRN

## 2018-06-19 MED ORDER — ONDANSETRON HCL 4 MG/2ML IJ SOLN: 4.0000 mg | Freq: Four times a day (QID) | INTRAMUSCULAR | Status: DC | PRN

## 2018-06-19 MED ORDER — IBUPROFEN 600 MG PO TABS
600.0000 mg | ORAL_TABLET | Freq: Four times a day (QID) | ORAL | Status: DC
  Administered 2018-06-19 – 2018-06-21 (×8): 600 mg via ORAL
  Filled 2018-06-19 (×8): qty 1

## 2018-06-19 MED ORDER — LIDOCAINE HCL (PF) 1 % IJ SOLN
30.0000 mL | INTRAMUSCULAR | Status: DC | PRN
  Filled 2018-06-19: qty 30

## 2018-06-19 MED ORDER — OXYCODONE HCL 5 MG PO TABS
5.0000 mg | ORAL_TABLET | ORAL | Status: DC | PRN
  Administered 2018-06-19: 5 mg via ORAL
  Filled 2018-06-19: qty 1

## 2018-06-19 MED ORDER — OXYTOCIN BOLUS FROM INFUSION
500.0000 mL | Freq: Once | INTRAVENOUS | Status: AC
  Administered 2018-06-19: 500 mL via INTRAVENOUS

## 2018-06-19 MED ORDER — ONDANSETRON HCL 4 MG/2ML IJ SOLN: 4.0000 mg | INTRAMUSCULAR | Status: DC | PRN

## 2018-06-19 MED ORDER — SIMETHICONE 80 MG PO CHEW: 80.0000 mg | CHEWABLE_TABLET | ORAL | Status: DC | PRN

## 2018-06-19 MED ORDER — DIPHENHYDRAMINE HCL 50 MG/ML IJ SOLN: 12.5000 mg | INTRAMUSCULAR | Status: DC | PRN

## 2018-06-19 MED ORDER — LIDOCAINE HCL (PF) 1 % IJ SOLN
INTRAMUSCULAR | Status: DC | PRN
  Administered 2018-06-19: 10 mL via EPIDURAL

## 2018-06-19 MED ORDER — FLEET ENEMA 7-19 GM/118ML RE ENEM: 1.0000 | ENEMA | RECTAL | Status: DC | PRN

## 2018-06-19 NOTE — Progress Notes (Signed)
Ashlee Simmons is a 22 y.o. G2P1001 at [redacted]w[redacted]d admitted for rupture of membranes  Subjective:  Getting more comfortable with epidural. No complaints or questions at this time.  Objective: BP 98/64   Pulse 90   Temp 98.1 F (36.7 C) (Oral)   Resp 18   Ht 5\' 2"  (1.575 m)   Wt 83.2 kg   LMP 09/14/2017   BMI 33.54 kg/m  No intake/output data recorded. No intake/output data recorded.  FHT:  FHR: 135 bpm, variability: moderate,  accelerations:  Present,  decelerations:  Absent UC:   regular, every 2-3 minutes SVE:   Dilation: 7 Effacement (%): 90 Station: -1 Exam by:: Thressa Sheller CNM  Labs: Lab Results  Component Value Date   WBC 9.6 06/19/2018   HGB 11.7 (L) 06/19/2018   HCT 35.0 (L) 06/19/2018   MCV 85.2 06/19/2018   PLT 112 (L) 06/19/2018    Assessment / Plan: Spontaneous labor, progressing normally  Labor: Progressing normally Preeclampsia:  NA Fetal Wellbeing:  Category I Pain Control:  Epidural I/D:  n/a Anticipated MOD:  NSVD  Thressa Sheller 06/19/2018, 2:14 PM

## 2018-06-19 NOTE — MAU Note (Signed)
Pt contracting since 0100. Now 2-3 mins apart. Denies LOF or bleeding. +FM

## 2018-06-19 NOTE — Anesthesia Preprocedure Evaluation (Signed)
Anesthesia Evaluation  Patient identified by MRN, date of birth, ID band Patient awake    Reviewed: Allergy & Precautions, H&P , NPO status , Patient's Chart, lab work & pertinent test results  History of Anesthesia Complications Negative for: history of anesthetic complications  Airway Mallampati: II  TM Distance: >3 FB Neck ROM: full    Dental no notable dental hx.    Pulmonary neg pulmonary ROS, former smoker,    Pulmonary exam normal        Cardiovascular negative cardio ROS Normal cardiovascular exam Rhythm:regular Rate:Normal     Neuro/Psych negative neurological ROS  negative psych ROS   GI/Hepatic negative GI ROS, Neg liver ROS,   Endo/Other  negative endocrine ROS  Renal/GU negative Renal ROS  negative genitourinary   Musculoskeletal   Abdominal   Peds  Hematology negative hematology ROS (+)   Anesthesia Other Findings Baseline low plt count (150s). Plts 112 one hour prior to epidural placement.  Reproductive/Obstetrics (+) Pregnancy                             Anesthesia Physical Anesthesia Plan  ASA: II  Anesthesia Plan: Epidural   Post-op Pain Management:    Induction:   PONV Risk Score and Plan:   Airway Management Planned:   Additional Equipment:   Intra-op Plan:   Post-operative Plan:   Informed Consent: I have reviewed the patients History and Physical, chart, labs and discussed the procedure including the risks, benefits and alternatives for the proposed anesthesia with the patient or authorized representative who has indicated his/her understanding and acceptance.     Plan Discussed with:   Anesthesia Plan Comments:         Anesthesia Quick Evaluation

## 2018-06-19 NOTE — Anesthesia Procedure Notes (Addendum)
Epidural Patient location during procedure: OB Start time: 06/19/2018 9:36 AM End time: 06/19/2018 9:51 AM  Staffing Anesthesiologist: Lucretia Kern, MD Performed: anesthesiologist   Preanesthetic Checklist Completed: patient identified, pre-op evaluation, timeout performed, IV checked, risks and benefits discussed and monitors and equipment checked  Epidural Patient position: sitting Prep: DuraPrep Patient monitoring: heart rate, continuous pulse ox and blood pressure Approach: midline Location: L3-L4 Injection technique: LOR saline  Needle:  Needle type: Tuohy  Needle gauge: 17 G Needle length: 9 cm Needle insertion depth: 7 cm Catheter type: closed end flexible Catheter size: 19 Gauge Catheter at skin depth: 12 cm  Assessment Events: blood not aspirated, injection not painful, no injection resistance, negative IV test and no paresthesia  Additional Notes Reason for block:procedure for pain

## 2018-06-20 ENCOUNTER — Encounter (HOSPITAL_COMMUNITY)
Admission: AD | Disposition: A | Discharge status: Home / Self Care | Payer: Self-pay | Source: Home / Self Care | Attending: Family Medicine

## 2018-06-20 ENCOUNTER — Encounter (HOSPITAL_COMMUNITY): Payer: Self-pay | Admitting: Anesthesiology

## 2018-06-20 LAB — RPR: RPR: NONREACTIVE

## 2018-06-20 SURGERY — LIGATION, FALLOPIAN TUBE, POSTPARTUM
Anesthesia: Epidural | Laterality: Bilateral

## 2018-06-20 MED ORDER — FAMOTIDINE 20 MG PO TABS
40.0000 mg | ORAL_TABLET | Freq: Once | ORAL | Status: DC
  Filled 2018-06-20: qty 2

## 2018-06-20 MED ORDER — LACTATED RINGERS IV SOLN: INTRAVENOUS | Status: DC

## 2018-06-20 MED ORDER — METOCLOPRAMIDE HCL 10 MG PO TABS
10.0000 mg | ORAL_TABLET | Freq: Once | ORAL | Status: DC
  Filled 2018-06-20: qty 1

## 2018-06-20 NOTE — Anesthesia Postprocedure Evaluation (Signed)
Anesthesia Post Note  Patient: Ashlee Simmons  Procedure(s) Performed: AN AD HOC LABOR EPIDURAL     Patient location during evaluation: Mother Baby Anesthesia Type: Epidural Level of consciousness: awake and alert and oriented Pain management: satisfactory to patient Vital Signs Assessment: post-procedure vital signs reviewed and stable Respiratory status: respiratory function stable Cardiovascular status: stable Postop Assessment: no headache, no backache, epidural receding, patient able to bend at knees, no signs of nausea or vomiting and adequate PO intake Anesthetic complications: no    Last Vitals:  Vitals:   06/20/18 0054 06/20/18 0555  BP: 93/61 106/73  Pulse: 77 86  Resp: 18 18  Temp: 36.8 C 36.7 C  SpO2:      Last Pain:  Vitals:   06/20/18 0556  TempSrc:   PainSc: 0-No pain   Pain Goal: Patients Stated Pain Goal: 3 (06/19/18 1749)               Karleen Dolphin

## 2018-06-20 NOTE — Progress Notes (Signed)
POSTPARTUM PROGRESS NOTE  Post Partum Day 1 Subjective:  Ashlee Simmons is a 22 y.o. G2P1001 [redacted]w[redacted]d s/p SVD.  No acute events overnight.  Pt denies problems with ambulating, voiding or po intake.  She denies nausea or vomiting.  Pain is moderately controlled.  She has had flatus. She has had bowel movement.  Lochia Moderate.   Objective: Blood pressure 106/73, pulse 86, temperature 98 F (36.7 C), temperature source Oral, resp. rate 18, height 5\' 2"  (1.575 m), weight 83.2 kg, last menstrual period 09/14/2017, SpO2 98 %, unknown if currently breastfeeding.  Physical Exam:  General: alert, cooperative and no distress Lochia:normal flow Chest: CTAB Heart: RRR no m/r/g Abdomen: +BS, soft, nontender,  Uterine Fundus: firm DVT Evaluation: No calf swelling or tenderness Extremities: no edema  Recent Labs    06/19/18 0832 06/19/18 1641  HGB 11.7* 11.3*  HCT 35.0* 34.4*    Assessment/Plan:  ASSESSMENT: Ashlee Simmons is a 22 y.o. G2P1001 [redacted]w[redacted]d s/p SVD  #MOF: bottle #MOC: interval tubal ligation Plan for DC 9/30   LOS: 1 day   Mirian Mo, MD 06/20/2018, 7:28 AM

## 2018-06-20 NOTE — H&P (Addendum)
OBSTETRIC ADMISSION HISTORY AND PHYSICAL  Ashlee Simmons is a 22 y.o. female G2P1001 with IUP at [redacted]w[redacted]d by L/13 presenting for SOL. She reports +FMs, No LOF, no VB, no blurry vision, headaches or peripheral edema, and RUQ pain.  She plans on breast feeding. She request BTL for birth control. She received her prenatal care at [facility]   Dating: By L/13 --->  Estimated Date of Delivery: 06/21/18  Sono: 19 w1d, normal anatomy, Bilateral Choroid plexus cysts  Prenatal History/Complications: BTL papers signed Metal plate in pelvis/rods in each femur  Past Medical History: Past Medical History:  Diagnosis Date  . Anxiety   . Asthma   . Depression   . Hypothyroidism     Past Surgical History: Past Surgical History:  Procedure Laterality Date  . FEMUR SURGERY     plate in pelvis, rods in each femur  . HIP SURGERY    . MOUTH SURGERY      Obstetrical History: OB History    Gravida  2   Para  1   Term  1   Preterm      AB      Living  1     SAB      TAB      Ectopic      Multiple  0   Live Births  1           Social History: Social History   Socioeconomic History  . Marital status: Single    Spouse name: Not on file  . Number of children: Not on file  . Years of education: Not on file  . Highest education level: Not on file  Occupational History  . Not on file  Social Needs  . Financial resource strain: Not on file  . Food insecurity:    Worry: Not on file    Inability: Not on file  . Transportation needs:    Medical: Not on file    Non-medical: Not on file  Tobacco Use  . Smoking status: Former Games developer  . Smokeless tobacco: Former Neurosurgeon  . Tobacco comment: No cigarettes/ smoke marjuana  Substance and Sexual Activity  . Alcohol use: No    Comment: occ  . Drug use: Yes    Types: Marijuana    Comment: none with pregnancy  . Sexual activity: Yes    Birth control/protection: None  Lifestyle  . Physical activity:    Days per week: Not on file   Minutes per session: Not on file  . Stress: Not on file  Relationships  . Social connections:    Talks on phone: Not on file    Gets together: Not on file    Attends religious service: Not on file    Active member of club or organization: Not on file    Attends meetings of clubs or organizations: Not on file    Relationship status: Not on file  Other Topics Concern  . Not on file  Social History Narrative  . Not on file    Family History: Family History  Problem Relation Age of Onset  . COPD Maternal Grandfather   . Alcohol abuse Neg Hx   . Arthritis Neg Hx   . Asthma Neg Hx   . Birth defects Neg Hx   . Cancer Neg Hx   . Depression Neg Hx   . Diabetes Neg Hx   . Drug abuse Neg Hx   . Early death Neg Hx   . Hearing loss Neg  Hx   . Heart disease Neg Hx   . Hyperlipidemia Neg Hx   . Hypertension Neg Hx   . Kidney disease Neg Hx   . Learning disabilities Neg Hx   . Mental illness Neg Hx   . Mental retardation Neg Hx   . Miscarriages / Stillbirths Neg Hx   . Stroke Neg Hx   . Vision loss Neg Hx   . Varicose Veins Neg Hx   . Intellectual disability Neg Hx   . Obesity Neg Hx   . ADD / ADHD Neg Hx   . Anxiety disorder Neg Hx     Allergies: No Known Allergies  Medications Prior to Admission  Medication Sig Dispense Refill Last Dose  . Prenatal Vit-Fe Fumarate-FA (PRENATAL MULTIVITAMIN) TABS tablet Take 1 tablet by mouth daily at 12 noon.   06/18/2018 at Unknown time  . HYDROcodone-acetaminophen (NORCO) 5-325 MG tablet Take 1 tablet by mouth every 6 (six) hours as needed for severe pain. 6 tablet 0 Unknown at Unknown time  . simethicone (GAS-X) 80 MG chewable tablet Chew 1 tablet (80 mg total) by mouth every 6 (six) hours as needed for flatulence. (Patient not taking: Reported on 05/28/2018) 30 tablet 0 Unknown at Unknown time     Review of Systems   All systems reviewed and negative except as stated in HPI  Blood pressure 93/61, pulse 77, temperature 98.2 F (36.8  C), temperature source Oral, resp. rate 18, height 5\' 2"  (1.575 m), weight 83.2 kg, last menstrual period 09/14/2017, SpO2 98 %, unknown if currently breastfeeding. General appearance: alert, cooperative, appears stated age and no distress Lungs: clear to auscultation bilaterally Heart: regular rate and rhythm Abdomen: soft, non-tender; bowel sounds normal Pelvic: see below Extremities: Homans sign is negative, no sign of DVT Presentation: cephalic Dilation: 10 Effacement (%): 100 Station: Plus 1, Plus 2 Exam by:: Lajuana Matte, RN   Prenatal labs: ABO, Rh: --/--/O POS (09/28 2440) Antibody: NEG (09/28 1027) Rubella:  immune RPR:   neg HBsAg:   meg HIV:   non reactive GBS:   neg 1 hr Glucola normal Genetic screening  normal Anatomy US normal  Prenatal Transfer Tool  Maternal Diabetes: No Genetic Screening: Normal Maternal Ultrasounds/Referrals: Normal Fetal Ultrasounds or other Referrals:  None Maternal Substance Abuse:  No Significant Maternal Medications:  None Significant Maternal Lab Results: Lab values include: Group B Strep negative  Results for orders placed or performed during the hospital encounter of 06/19/18 (from the past 24 hour(s))  Urinalysis, Routine w reflex microscopic   Collection Time: 06/19/18  4:23 AM  Result Value Ref Range   Color, Urine YELLOW YELLOW   APPearance CLOUDY (A) CLEAR   Specific Gravity, Urine 1.003 (L) 1.005 - 1.030   pH 7.0 5.0 - 8.0   Glucose, UA NEGATIVE NEGATIVE mg/dL   Hgb urine dipstick SMALL (A) NEGATIVE   Bilirubin Urine NEGATIVE NEGATIVE   Ketones, ur NEGATIVE NEGATIVE mg/dL   Protein, ur NEGATIVE NEGATIVE mg/dL   Nitrite NEGATIVE NEGATIVE   Leukocytes, UA LARGE (A) NEGATIVE   RBC / HPF 6-10 0 - 5 RBC/hpf   WBC, UA >50 (H) 0 - 5 WBC/hpf   Bacteria, UA RARE (A) NONE SEEN   Squamous Epithelial / LPF 11-20 0 - 5   Mucus PRESENT    Budding Yeast PRESENT   CBC   Collection Time: 06/19/18  8:32 AM  Result Value Ref  Range   WBC 9.6 4.0 - 10.5 K/uL   RBC  4.11 3.87 - 5.11 MIL/uL   Hemoglobin 11.7 (L) 12.0 - 15.0 g/dL   HCT 16.1 (L) 09.6 - 04.5 %   MCV 85.2 78.0 - 100.0 fL   MCH 28.5 26.0 - 34.0 pg   MCHC 33.4 30.0 - 36.0 g/dL   RDW 40.9 81.1 - 91.4 %   Platelets 112 (L) 150 - 400 K/uL  Type and screen [facility]   Collection Time: 06/19/18  8:32 AM  Result Value Ref Range   ABO/RH(D) O POS    Antibody Screen NEG    Sample Expiration      06/22/2018 Performed at [facility], [address], [place], [place] [address]   CBC   Collection Time: 06/19/18  4:41 PM  Result Value Ref Range   WBC 9.8 4.0 - 10.5 K/uL   RBC 4.06 3.87 - 5.11 MIL/uL   Hemoglobin 11.3 (L) 12.0 - 15.0 g/dL   HCT 62.1 (L) 30.8 - 65.7 %   MCV 84.7 78.0 - 100.0 fL   MCH 27.8 26.0 - 34.0 pg   MCHC 32.8 30.0 - 36.0 g/dL   RDW 84.6 96.2 - 95.2 %   Platelets 129 (L) 150 - 400 K/uL    Patient Active Problem List   Diagnosis Date Noted  . Labor and delivery, indication for care 06/19/2018  . Pregnant and not yet delivered 10/16/2016    Assessment/Plan:  Kadija Cruzen is a 22 y.o. G2P1001 at [redacted]w[redacted]d here for SOL  #Labor: expectant management #Pain: Epidural placed #FWB: Category I #ID:  GBS negative #MOF: breast #MOC:BTL #Circ:  N/a, girl  Mirian Mo, MD  06/20/2018, 2:01 AM   OB FELLOW HISTORY AND PHYSICAL ATTESTATION  I have seen and examined this patient; I agree with above documentation in the resident's note.   Marcy Siren, D.O. OB Fellow  06/21/2018, 4:47 PM

## 2018-06-20 NOTE — Anesthesia Preprocedure Evaluation (Addendum)
Anesthesia Evaluation  Patient identified by MRN, date of birth, ID band Patient awake    Reviewed: Allergy & Precautions, H&P , NPO status , Patient's Chart, lab work & pertinent test results  History of Anesthesia Complications Negative for: history of anesthetic complications  Airway Mallampati: II  TM Distance: >3 FB Neck ROM: full    Dental no notable dental hx.    Pulmonary neg pulmonary ROS, former smoker,    Pulmonary exam normal        Cardiovascular negative cardio ROS Normal cardiovascular exam Rhythm:regular Rate:Normal     Neuro/Psych negative neurological ROS  negative psych ROS   GI/Hepatic negative GI ROS, Neg liver ROS,   Endo/Other  Hypothyroidism   Renal/GU negative Renal ROS  negative genitourinary   Musculoskeletal   Abdominal   Peds  Hematology negative hematology ROS (+)   Anesthesia Other Findings Baseline low plt count (150s). Plts 112 one hour prior to epidural placement.  Reproductive/Obstetrics (+) Pregnancy                             Anesthesia Physical  Anesthesia Plan  ASA: II  Anesthesia Plan: Epidural and MAC   Post-op Pain Management:    Induction:   PONV Risk Score and Plan:   Airway Management Planned: Nasal Cannula and Natural Airway  Additional Equipment:   Intra-op Plan:   Post-operative Plan:   Informed Consent: I have reviewed the patients History and Physical, chart, labs and discussed the procedure including the risks, benefits and alternatives for the proposed anesthesia with the patient or authorized representative who has indicated his/her understanding and acceptance.     Plan Discussed with: Anesthesiologist, Surgeon and CRNA  Anesthesia Plan Comments:        Anesthesia Quick Evaluation

## 2018-06-21 ENCOUNTER — Encounter (HOSPITAL_COMMUNITY): Payer: Self-pay

## 2018-06-21 MED ORDER — ACETAMINOPHEN 325 MG PO TABS: 650.0000 mg | ORAL_TABLET | ORAL | 0 refills | Status: DC | PRN

## 2018-06-21 MED ORDER — IBUPROFEN 600 MG PO TABS: 600.0000 mg | ORAL_TABLET | Freq: Four times a day (QID) | ORAL | 0 refills | Status: DC

## 2018-06-21 NOTE — Discharge Summary (Addendum)
OB Discharge Summary     Patient Name: Ashlee Simmons DOB: 02-27-1996 MRN: [id number]  Date of admission: 06/19/2018 Delivering MD: Thressa Sheller D   Date of discharge: 06/21/2018  Admitting diagnosis: 39 wks ctx every 2 min Intrauterine pregnancy: [redacted]w[redacted]d     Secondary diagnosis:  Active Problems:   Labor and delivery, indication for care  Additional problems: precipitous delivery     Discharge diagnosis: Term Pregnancy Delivered                                                                                                Post partum procedures:none  Augmentation: none  Complications: None  Hospital course:  Onset of Labor With Vaginal Delivery     22 y.o. yo G2P1001 at [redacted]w[redacted]d was admitted in Active Labor on 06/19/2018. Patient had an uncomplicated labor course as follows:  Membrane Rupture Time/Date: 2:48 PM ,06/19/2018   Intrapartum Procedures: Episiotomy: None [1]                                         Lacerations:  None [1]  Patient had a delivery of a Viable infant. 06/19/2018  Information for the patient's newborn:  Ashlee Simmons [[id number]]  Delivery Method: Vaginal, Spontaneous(Filed from Delivery Summary)    Pateint had an uncomplicated postpartum course.  She is ambulating, tolerating a regular diet, passing flatus, and urinating well. Patient is discharged home in stable condition on 06/21/18.   Physical exam  Vitals:   06/20/18 0555 06/20/18 1305 06/20/18 2115 06/21/18 0551  BP: 106/73 (!) 97/54 92/65 97/86   Pulse: 86 70 70 69  Resp: 18 18 16 16   Temp: 98 F (36.7 C) 98.7 F (37.1 C) 98.7 F (37.1 C) 97.9 F (36.6 C)  TempSrc: Oral Oral Oral Oral  SpO2:   99% 99%  Weight:      Height:       General: alert, cooperative and no distress Lochia: appropriate Uterine Fundus: firm Incision: N/A DVT Evaluation: No evidence of DVT seen on physical exam. Labs: Lab Results  Component Value Date   WBC 9.8 06/19/2018   HGB 11.3 (L) 06/19/2018   HCT 34.4 (L) 06/19/2018   MCV 84.7 06/19/2018   PLT 129 (L) 06/19/2018   CMP Latest Ref Rng & Units 01/06/2018  Glucose 65 - 99 mg/dL 89  BUN 6 - 20 mg/dL 5(L)  Creatinine 7.25 - 1.00 mg/dL 3.66(Y)  Sodium 403 - 474 mmol/L 136  Potassium 3.5 - 5.1 mmol/L 4.0  Chloride 101 - 111 mmol/L 107  CO2 22 - 32 mmol/L 19(L)  Calcium 8.9 - 10.3 mg/dL 2.5(Z)  Total Protein 6.5 - 8.1 g/dL 6.7  Total Bilirubin 0.3 - 1.2 mg/dL 0.5  Alkaline Phos 38 - 126 U/L 70  AST 15 - 41 U/L 16  ALT 14 - 54 U/L 16    Discharge instruction: per After Visit Summary and "Baby and Me Booklet".  After visit meds:  Allergies as of 06/21/2018   No Known  Allergies     Medication List    TAKE these medications   acetaminophen 325 MG tablet Commonly known as:  TYLENOL Take 2 tablets (650 mg total) by mouth every 4 (four) hours as needed (for pain scale < 4).   ibuprofen 600 MG tablet Commonly known as:  ADVIL,MOTRIN Take 1 tablet (600 mg total) by mouth every 6 (six) hours.   prenatal multivitamin Tabs tablet Take 1 tablet by mouth daily at 12 noon.       Diet: routine diet  Activity: Advance as tolerated. Pelvic rest for 6 weeks.   Outpatient follow up:6 weeks Follow up Appt:No future appointments. Follow up Visit:No follow-ups on file.  Postpartum contraception: Interval tubal ligation  Newborn Data: Live born female  Birth Weight: 7 lb 15.3 oz (3610 g) APGAR: 9, 9  Newborn Delivery   Time head delivered:  06/19/2018 14:52:00 Birth date/time:  06/19/2018 14:53:00 Delivery type:  Vaginal, Spontaneous     Baby Feeding: Breast Disposition:home with mother   06/21/2018 Mirian Mo, MD  I confirm that I have verified the information documented in the resident's note and that I have also personally reperformed the physical exam and all medical decision making activities. Patient was seen and examined by me also Agree with note Vitals stable Labs stable Fundus firm, lochia within normal  limits Perineum healing Ext WNL Discussed BTL at her age is usually not recommended. Patient states she talked to Dr Despina Hidden for a long time about it but "didn't have time to tell him all of the other factors involved".   I recommend she use some other method in the meantime before she can discuss in clinic about possibility of doing BTL. States she will think about it. Ready for discharge  Aviva Signs, CNM

## 2018-06-21 NOTE — Discharge Instructions (Signed)
Vaginal Delivery, Care After °Refer to this sheet in the next few weeks. These instructions provide you with information about caring for yourself after vaginal delivery. Your health care provider may also give you more specific instructions. Your treatment has been planned according to current medical practices, but problems sometimes occur. Call your health care provider if you have any problems or questions. °What can I expect after the procedure? °After vaginal delivery, it is common to have: °· Some bleeding from your vagina. °· Soreness in your abdomen, your vagina, and the area of skin between your vaginal opening and your anus (perineum). °· Pelvic cramps. °· Fatigue. ° °Follow these instructions at home: °Medicines °· Take over-the-counter and prescription medicines only as told by your health care provider. °· If you were prescribed an antibiotic medicine, take it as told by your health care provider. Do not stop taking the antibiotic until it is finished. °Driving ° °· Do not drive or operate heavy machinery while taking prescription pain medicine. °· Do not drive for 24 hours if you received a sedative. °Lifestyle °· Do not drink alcohol. This is especially important if you are breastfeeding or taking medicine to relieve pain. °· Do not use tobacco products, including cigarettes, chewing tobacco, or e-cigarettes. If you need help quitting, ask your health care provider. °Eating and drinking °· Drink at least 8 eight-ounce glasses of water every day unless you are told not to by your health care provider. If you choose to breastfeed your baby, you may need to drink more water than this. °· Eat high-fiber foods every day. These foods may help prevent or relieve constipation. High-fiber foods include: °? Whole grain cereals and breads. °? Brown rice. °? Beans. °? Fresh fruits and vegetables. °Activity °· Return to your normal activities as told by your health care provider. Ask your health care provider  what activities are safe for you. °· Rest as much as possible. Try to rest or take a nap when your baby is sleeping. °· Do not lift anything that is heavier than your baby or 10 lb (4.5 kg) until your health care provider says that it is safe. °· Talk with your health care provider about when you can engage in sexual activity. This may depend on your: °? Risk of infection. °? Rate of healing. °? Comfort and desire to engage in sexual activity. °Vaginal Care °· If you have an episiotomy or a vaginal tear, check the area every day for signs of infection. Check for: °? More redness, swelling, or pain. °? More fluid or blood. °? Warmth. °? Pus or a bad smell. °· Do not use tampons or douches until your health care provider says this is safe. °· Watch for any blood clots that may pass from your vagina. These may look like clumps of dark red, brown, or black discharge. °General instructions °· Keep your perineum clean and dry as told by your health care provider. °· Wear loose, comfortable clothing. °· Wipe from front to back when you use the toilet. °· Ask your health care provider if you can shower or take a bath. If you had an episiotomy or a perineal tear during labor and delivery, your health care provider may tell you not to take baths for a certain length of time. °· Wear a bra that supports your breasts and fits you well. °· If possible, have someone help you with household activities and help care for your baby for at least a few days after   you leave the hospital. °· Keep all follow-up visits for you and your baby as told by your health care provider. This is important. °Contact a health care provider if: °· You have: °? Vaginal discharge that has a bad smell. °? Difficulty urinating. °? Pain when urinating. °? A sudden increase or decrease in the frequency of your bowel movements. °? More redness, swelling, or pain around your episiotomy or vaginal tear. °? More fluid or blood coming from your episiotomy or  vaginal tear. °? Pus or a bad smell coming from your episiotomy or vaginal tear. °? A fever. °? A rash. °? Little or no interest in activities you used to enjoy. °? Questions about caring for yourself or your baby. °· Your episiotomy or vaginal tear feels warm to the touch. °· Your episiotomy or vaginal tear is separating or does not appear to be healing. °· Your breasts are painful, hard, or turn red. °· You feel unusually sad or worried. °· You feel nauseous or you vomit. °· You pass large blood clots from your vagina. If you pass a blood clot from your vagina, save it to show to your health care provider. Do not flush blood clots down the toilet without having your health care provider look at them. °· You urinate more than usual. °· You are dizzy or light-headed. °· You have not breastfed at all and you have not had a menstrual period for 12 weeks after delivery. °· You have stopped breastfeeding and you have not had a menstrual period for 12 weeks after you stopped breastfeeding. °Get help right away if: °· You have: °? Pain that does not go away or does not get better with medicine. °? Chest pain. °? Difficulty breathing. °? Blurred vision or spots in your vision. °? Thoughts about hurting yourself or your baby. °· You develop pain in your abdomen or in one of your legs. °· You develop a severe headache. °· You faint. °· You bleed from your vagina so much that you fill two sanitary pads in one hour. °This information is not intended to replace advice given to you by your health care provider. Make sure you discuss any questions you have with your health care provider. °Document Released: 09/05/2000 Document Revised: 02/20/2016 Document Reviewed: 09/23/2015 °Elsevier Interactive Patient Education © 2018 [employer] ° °

## 2018-06-21 NOTE — Progress Notes (Signed)
CSW received consult for hx of Anxiety and Depression.  CSW met with MOB to offer support and complete assessment.    When CSW arrived MOB was resting in bed and FOB was holding and bonding with infant. With MOB's permission, CSW asked FOB to leave the room in order to meet with MOB in private.  MOB polite, easy to engage and receptive to meeting with CSW.   CSW asked about MOB's MH hx and MOB acknowledged a hx of anxiety/depression prior to the birth of MOB's oldest child.  MOB stated, "It's more anxiety than depression"  MOB reported managing her symptoms by engaging in self care activities (Cooking, taking walks, and listening to music). MOB also shared that MOB has a good support team that she talks to when she feels overwhelmed or depressed. MOB reported "feeling good," through the pregnancy and did not present with any acute MH signs or symptoms during the assessment. MOB denied having any PMAD symtoms after her first pregnancy.   CSW provided education regarding the baby blues period vs. perinatal mood disorders, discussed treatment and gave resources for mental health follow up if concerns arise.  CSW recommends self-evaluation during the postpartum time period using the New Mom Checklist from Postpartum Progress and encouraged MOB to contact a medical professional if symptoms are noted at any time.  MOB presented with insight and awareness and communicated feeling comfortable seeking help if help is warranted.  CSW offered MOB resources and MOB declined. CSW assessed for safety and MOB denied SI, HI, and DV.   MOB  Reported having all essential items need for infant and feeling prepared to parent.   MOB denied the use of all illicit substance during pregnancy.   CSW identifies no further need for intervention and no barriers to discharge at this time.  Eldra Word Boyd-Gilyard, MSW, LCSW Clinical Social Work [phone]  

## 2018-08-23 ENCOUNTER — Emergency Department (HOSPITAL_COMMUNITY): Payer: Medicaid Other

## 2018-08-23 ENCOUNTER — Encounter (HOSPITAL_COMMUNITY): Payer: Self-pay | Admitting: Emergency Medicine

## 2018-08-23 ENCOUNTER — Emergency Department (HOSPITAL_COMMUNITY)
Admission: EM | Admit: 2018-08-23 | Discharge: 2018-08-24 | Disposition: A | Discharge status: Home / Self Care | Payer: Medicaid Other | Attending: Emergency Medicine | Admitting: Emergency Medicine

## 2018-08-23 ENCOUNTER — Other Ambulatory Visit: Payer: Self-pay

## 2018-08-23 DIAGNOSIS — M7918 Myalgia, other site: Secondary | ICD-10-CM | POA: Insufficient documentation

## 2018-08-23 DIAGNOSIS — R51 Headache: Secondary | ICD-10-CM | POA: Insufficient documentation

## 2018-08-23 DIAGNOSIS — Z87891 Personal history of nicotine dependence: Secondary | ICD-10-CM | POA: Diagnosis not present

## 2018-08-23 DIAGNOSIS — R6889 Other general symptoms and signs: Secondary | ICD-10-CM

## 2018-08-23 DIAGNOSIS — R05 Cough: Secondary | ICD-10-CM | POA: Insufficient documentation

## 2018-08-23 DIAGNOSIS — E039 Hypothyroidism, unspecified: Secondary | ICD-10-CM | POA: Insufficient documentation

## 2018-08-23 DIAGNOSIS — R509 Fever, unspecified: Secondary | ICD-10-CM | POA: Insufficient documentation

## 2018-08-23 LAB — URINALYSIS, ROUTINE W REFLEX MICROSCOPIC
Bilirubin Urine: NEGATIVE
Glucose, UA: NEGATIVE mg/dL
Hgb urine dipstick: NEGATIVE
Ketones, ur: NEGATIVE mg/dL
LEUKOCYTES UA: NEGATIVE
NITRITE: NEGATIVE
PH: 6 (ref 5.0–8.0)
Protein, ur: NEGATIVE mg/dL
SPECIFIC GRAVITY, URINE: 1.01 (ref 1.005–1.030)

## 2018-08-23 LAB — CBC WITH DIFFERENTIAL/PLATELET
ABS IMMATURE GRANULOCYTES: 0.02 10*3/uL (ref 0.00–0.07)
BASOS ABS: 0 10*3/uL (ref 0.0–0.1)
Basophils Relative: 0 %
Eosinophils Absolute: 0 10*3/uL (ref 0.0–0.5)
Eosinophils Relative: 1 %
HEMATOCRIT: 39.8 % (ref 36.0–46.0)
HEMOGLOBIN: 13.1 g/dL (ref 12.0–15.0)
Immature Granulocytes: 1 %
LYMPHS ABS: 0.5 10*3/uL — AB (ref 0.7–4.0)
LYMPHS PCT: 15 %
MCH: 27.4 pg (ref 26.0–34.0)
MCHC: 32.9 g/dL (ref 30.0–36.0)
MCV: 83.3 fL (ref 80.0–100.0)
MONO ABS: 0.4 10*3/uL (ref 0.1–1.0)
Monocytes Relative: 12 %
NEUTROS ABS: 2.6 10*3/uL (ref 1.7–7.7)
NRBC: 0 % (ref 0.0–0.2)
Neutrophils Relative %: 71 %
Platelets: 153 10*3/uL (ref 150–400)
RBC: 4.78 MIL/uL (ref 3.87–5.11)
RDW: 13 % (ref 11.5–15.5)
WBC: 3.7 10*3/uL — ABNORMAL LOW (ref 4.0–10.5)

## 2018-08-23 LAB — I-STAT BETA HCG BLOOD, ED (MC, WL, AP ONLY): HCG, QUANTITATIVE: 11.4 m[IU]/mL — AB (ref <5)

## 2018-08-23 LAB — I-STAT CG4 LACTIC ACID, ED: LACTIC ACID, VENOUS: 1.53 mmol/L (ref 0.5–1.9)

## 2018-08-23 LAB — COMPREHENSIVE METABOLIC PANEL
ALT: 36 U/L (ref 0–44)
AST: 28 U/L (ref 15–41)
Albumin: 3.9 g/dL (ref 3.5–5.0)
Alkaline Phosphatase: 80 U/L (ref 38–126)
Anion gap: 11 (ref 5–15)
BUN: 8 mg/dL (ref 6–20)
CHLORIDE: 103 mmol/L (ref 98–111)
CO2: 21 mmol/L — AB (ref 22–32)
CREATININE: 0.66 mg/dL (ref 0.44–1.00)
Calcium: 8.8 mg/dL — ABNORMAL LOW (ref 8.9–10.3)
GFR calc Af Amer: >60 mL/min (ref >60)
GFR calc non Af Amer: >60 mL/min (ref >60)
Glucose, Bld: 97 mg/dL (ref 70–99)
Potassium: 3.5 mmol/L (ref 3.5–5.1)
SODIUM: 135 mmol/L (ref 135–145)
Total Bilirubin: 0.4 mg/dL (ref 0.3–1.2)
Total Protein: 7 g/dL (ref 6.5–8.1)

## 2018-08-23 NOTE — ED Triage Notes (Signed)
Pt. Stated, Ashlee Simmons had a headache, body aches, and fever since yesterday . The only thing that Ive taken is Muscinex.

## 2018-08-24 MED ORDER — IBUPROFEN 400 MG PO TABS
600.0000 mg | ORAL_TABLET | Freq: Once | ORAL | Status: AC
  Administered 2018-08-24: 600 mg via ORAL
  Filled 2018-08-24: qty 1

## 2018-08-24 MED ORDER — OSELTAMIVIR PHOSPHATE 75 MG PO CAPS: 75.0000 mg | ORAL_CAPSULE | Freq: Two times a day (BID) | ORAL | 0 refills | Status: DC

## 2018-08-24 MED ORDER — ACETAMINOPHEN 500 MG PO TABS
1000.0000 mg | ORAL_TABLET | Freq: Once | ORAL | Status: AC
  Administered 2018-08-24: 1000 mg via ORAL
  Filled 2018-08-24: qty 2

## 2018-08-24 MED ORDER — ONDANSETRON 4 MG PO TBDP: 4.0000 mg | ORAL_TABLET | Freq: Three times a day (TID) | ORAL | 0 refills | Status: DC | PRN

## 2018-08-24 NOTE — Discharge Instructions (Addendum)
Please take Ibuprofen (Advil, motrin) and Tylenol (acetaminophen) to relieve your pain.  You may take up to 600 MG (3 pills) of normal strength ibuprofen every 8 hours as needed.  In between doses of ibuprofen you make take tylenol, up to 1,000 mg (two extra strength pills).  Do not take more than 3,000 mg tylenol in a 24 hour period.  Please check all medication labels as many medications such as pain and cold medications may contain tylenol.  Do not drink alcohol while taking these medications.  Do not take other NSAID'S while taking ibuprofen (such as aleve or naproxen).  Please take ibuprofen with food to decrease stomach upset.  As we discussed today the tamiflu may cause nausea, vomiting, and diarrhea.  You need to start this as soon as possible for it to be effective.  If you have worsening symptoms or new concerns please seek additional medical care.   As we discussed your pregnancy test today was slightly elevated and most likely is a false positive.  Please take an at home pregnancy test in one and two weeks, even if you have a normal period during that time.

## 2018-08-24 NOTE — ED Provider Notes (Signed)
[facility] EMERGENCY DEPARTMENT Provider Note   CSN: [id number] Arrival date & time: 08/23/18  1835     History   Chief Complaint Chief Complaint  Patient presents with  . Headache  . Influenza  . Generalized Body Aches    HPI Ashlee Simmons is a 22 y.o. female with a past medical history of hypothyroid, asthma, anxiety, depression, who presents today for evaluation of headache, body aches, fever, chills, and generally not feeling well since yesterday morning.  She reports that she has had a dry cough.  She has tried mucinex at home.  She did not get the flu shot.  She is concerned that if she has the flu she may give it to her small young children at home.  No abdominal pain, poor appetite.  No N/V/D.    HPI  Past Medical History:  Diagnosis Date  . Anxiety   . Asthma   . Depression   . Hypothyroidism     Patient Active Problem List   Diagnosis Date Noted  . Labor and delivery, indication for care 06/19/2018  . Pregnant and not yet delivered 10/16/2016  . Anxiety attack 01/26/2016    Past Surgical History:  Procedure Laterality Date  . FEMUR SURGERY     plate in pelvis, rods in each femur  . HIP SURGERY    . MOUTH SURGERY       OB History    Gravida  2   Para  1   Term  1   Preterm      AB      Living  1     SAB      TAB      Ectopic      Multiple  0   Live Births  1            Home Medications    Prior to Admission medications   Medication Sig Start Date End Date Taking? Authorizing Provider  acetaminophen (TYLENOL) 325 MG tablet Take 2 tablets (650 mg total) by mouth every 4 (four) hours as needed (for pain scale < 4). 06/21/18   Mirian Mo, MD  ibuprofen (ADVIL,MOTRIN) 600 MG tablet Take 1 tablet (600 mg total) by mouth every 6 (six) hours. 06/21/18   Mirian Mo, MD  ondansetron (ZOFRAN ODT) 4 MG disintegrating tablet Take 1 tablet (4 mg total) by mouth every 8 (eight) hours as needed for nausea or vomiting.  08/24/18   Cristina Gong, PA-C  oseltamivir (TAMIFLU) 75 MG capsule Take 1 capsule (75 mg total) by mouth every 12 (twelve) hours. 08/24/18   Cristina Gong, PA-C  Prenatal Vit-Fe Fumarate-FA (PRENATAL MULTIVITAMIN) TABS tablet Take 1 tablet by mouth daily at 12 noon.    [provider]    Family History Family History  Problem Relation Age of Onset  . COPD Maternal Grandfather   . Alcohol abuse Neg Hx   . Arthritis Neg Hx   . Asthma Neg Hx   . Birth defects Neg Hx   . Cancer Neg Hx   . Depression Neg Hx   . Diabetes Neg Hx   . Drug abuse Neg Hx   . Early death Neg Hx   . Hearing loss Neg Hx   . Heart disease Neg Hx   . Hyperlipidemia Neg Hx   . Hypertension Neg Hx   . Kidney disease Neg Hx   . Learning disabilities Neg Hx   . Mental illness Neg Hx   .  Mental retardation Neg Hx   . Miscarriages / Stillbirths Neg Hx   . Stroke Neg Hx   . Vision loss Neg Hx   . Varicose Veins Neg Hx   . Intellectual disability Neg Hx   . Obesity Neg Hx   . ADD / ADHD Neg Hx   . Anxiety disorder Neg Hx     Social History Social History   Tobacco Use  . Smoking status: Former Games developermoker  . Smokeless tobacco: Former NeurosurgeonUser  . Tobacco comment: No cigarettes/ smoke marjuana  Substance Use Topics  . Alcohol use: No    Comment: occ  . Drug use: Yes    Types: Marijuana    Comment: none with pregnancy     Allergies   Patient has no known allergies.   Review of Systems Review of Systems  Constitutional: Positive for chills, fatigue and fever.  HENT: Negative for congestion.   Respiratory: Positive for cough. Negative for chest tightness and shortness of breath.   Cardiovascular: Negative for chest pain and palpitations.  Gastrointestinal: Positive for nausea. Negative for abdominal pain, diarrhea and vomiting.  Genitourinary: Negative for dysuria, frequency, menstrual problem, pelvic pain, vaginal bleeding, vaginal discharge and vaginal pain.  Musculoskeletal:  Positive for arthralgias and myalgias. Negative for neck pain and neck stiffness.  Skin: Negative for rash.  Neurological: Positive for headaches. Negative for dizziness, weakness, light-headedness and numbness.  All other systems reviewed and are negative.    Physical Exam Updated Vital Signs BP 106/72   Pulse (!) 102   Temp 98.9 F (37.2 C) (Oral)   Resp 16   Ht 5\' 2"  (1.575 m)   Wt 75.8 kg   LMP 08/04/2018 (Exact Date) Comment: pt stated lmp Nov 15-19, 2019  SpO2 95%   BMI 30.54 kg/m   Physical Exam  Constitutional: She appears well-developed and well-nourished.  Non-toxic appearance. No distress.  HENT:  Head: Normocephalic and atraumatic.  Mouth/Throat: Oropharynx is clear and moist.  Eyes: Pupils are equal, round, and reactive to light. Conjunctivae and EOM are normal.  Neck: Normal range of motion. Neck supple. No neck rigidity.  Cardiovascular: Normal rate, regular rhythm, normal heart sounds and intact distal pulses.  No murmur heard. Pulmonary/Chest: Effort normal and breath sounds normal. No stridor. No respiratory distress. She has no wheezes. She has no rales. She exhibits no tenderness.  Abdominal: Soft. Bowel sounds are normal. She exhibits no distension. There is no tenderness.  Musculoskeletal: She exhibits no edema.  Neurological: She is alert.  Skin: Skin is warm and dry.  Psychiatric: She has a normal mood and affect.  Nursing note and vitals reviewed.    ED Treatments / Results  Labs (all labs ordered are listed, but only abnormal results are displayed) Labs Reviewed  COMPREHENSIVE METABOLIC PANEL - Abnormal; Notable for the following components:      Result Value   CO2 21 (*)    Calcium 8.8 (*)    All other components within normal limits  CBC WITH DIFFERENTIAL/PLATELET - Abnormal; Notable for the following components:   WBC 3.7 (*)    Lymphs Abs 0.5 (*)    All other components within normal limits  I-STAT BETA HCG BLOOD, ED ([facility], [facility], [facility] ONLY)  - Abnormal; Notable for the following components:   I-stat hCG, quantitative 11.4 (*)    All other components within normal limits  URINALYSIS, ROUTINE W REFLEX MICROSCOPIC  I-STAT CG4 LACTIC ACID, ED    EKG None  Radiology Dg Chest  2 View  Result Date: 08/23/2018 CLINICAL DATA:  Fever.  Productive cough. EXAM: CHEST - 2 VIEW COMPARISON:  None. FINDINGS: Mild peribronchial thickening. Heart and mediastinal contours are within normal limits. No focal opacities or effusions. No acute bony abnormality. IMPRESSION: Mild bronchitic changes. Electronically Signed   By: Charlett Nose M.D.   On: 08/23/2018 20:40    Procedures Procedures (including critical care time)  Medications Ordered in ED Medications  ibuprofen (ADVIL,MOTRIN) tablet 600 mg (600 mg Oral Given 08/24/18 0147)  acetaminophen (TYLENOL) tablet 1,000 mg (1,000 mg Oral Given 08/24/18 0147)     Initial Impression / Assessment and Plan / ED Course  I have reviewed the triage vital signs and the nursing notes.  Pertinent labs & imaging results that were available during my care of the patient were reviewed by me and considered in my medical decision making (see chart for details).    Patient with symptoms consistent with influenza.  Vitals are stable, low-grade fever.  No signs of dehydration, tolerating PO's.  Lungs are clear. CXR and labs ordered prior to my evaluation of the patient, CXR with out acute abnormalities.  Discussed risks/benefit with patient of Tamiflu, she has 2 small children at home, including a 98-month-old, and therefore will be given Tamiflu to help decrease her risk of transmission to her children.    Patient will be discharged with instructions to orally hydrate, rest, and use over-the-counter medications such as anti-inflammatories ibuprofen and Aleve for muscle aches and Tylenol for fever.  She is given zofran for nausea.  She is aware that her pregnancy test was slightly elevated and she needs to follow up  with at home testing in 1 and 2 weeks.   Return precautions were discussed with patient who states their understanding.  At the time of discharge patient denied any unaddressed complaints or concerns.  Patient is agreeable for discharge home.  Final Clinical Impressions(s) / ED Diagnoses   Final diagnoses:  Flu-like symptoms    ED Discharge Orders         Ordered    oseltamivir (TAMIFLU) 75 MG capsule  Every 12 hours     08/24/18 0206    ondansetron (ZOFRAN ODT) 4 MG disintegrating tablet  Every 8 hours PRN     08/24/18 0206           Cristina Gong, PA-C 08/24/18 0215    Little, Ambrose Finland, MD 08/24/18 [phone]

## 2018-08-24 NOTE — ED Notes (Signed)
Patient verbalizes understanding of medications and discharge instructions. No further questions at this time. VSS and patient ambulatory at discharge.   

## 2018-08-24 NOTE — ED Notes (Signed)
Patient able to tolerate PO fluids well. No nausea/vomiting noted at this time.

## 2018-09-01 ENCOUNTER — Ambulatory Visit (INDEPENDENT_AMBULATORY_CARE_PROVIDER_SITE_OTHER): Payer: Medicaid Other | Admitting: Obstetrics and Gynecology

## 2018-09-01 ENCOUNTER — Encounter: Payer: Self-pay | Admitting: Obstetrics and Gynecology

## 2018-09-01 ENCOUNTER — Telehealth: Payer: Self-pay

## 2018-09-01 VITALS — BP 117/73 | HR 84 | Ht 62.0 in | Wt 164.4 lb

## 2018-09-01 DIAGNOSIS — Z3009 Encounter for other general counseling and advice on contraception: Secondary | ICD-10-CM

## 2018-09-01 NOTE — Progress Notes (Signed)
GYNECOLOGY OFFICE FOLLOW UP NOTE  History:  22 y.o. [id number] here today for follow up for BTL counseling. Patient desires BTL strongly, she reports it was not done in hospital as the doctor there refused to do it. She has considered other contraception options but states she has had her mind made up and would like to proceed with BTL.  Has tried sprintec as well as a couple of other brands of pills and reports they made her very nauseous.   Past Medical History:  Diagnosis Date  . Anxiety   . Asthma   . Depression   . Hypothyroidism     Past Surgical History:  Procedure Laterality Date  . FEMUR SURGERY     plate in pelvis, rods in each femur  . HIP SURGERY    . MOUTH SURGERY      Current Outpatient Medications:  .  acetaminophen (TYLENOL) 325 MG tablet, Take 2 tablets (650 mg total) by mouth every 4 (four) hours as needed (for pain scale < 4)., Disp: 60 tablet, Rfl: 0 .  ibuprofen (ADVIL,MOTRIN) 600 MG tablet, Take 1 tablet (600 mg total) by mouth every 6 (six) hours., Disp: 60 tablet, Rfl: 0 .  oseltamivir (TAMIFLU) 75 MG capsule, Take 1 capsule (75 mg total) by mouth every 12 (twelve) hours. (Patient not taking: Reported on 09/01/2018), Disp: 10 capsule, Rfl: 0  The following portions of the patient's history were reviewed and updated as appropriate: allergies, current medications, past family history, past medical history, past social history, past surgical history and problem list.   Review of Systems:  Pertinent items noted in HPI and remainder of comprehensive ROS otherwise negative.   Objective:  Physical Exam BP 117/73   Pulse 84   Ht 5\' 2"  (1.575 m)   Wt 164 lb 6.4 oz (74.6 kg)   LMP 08/04/2018 (Exact Date) Comment: pt stated lmp Nov 15-19, 2019  BMI 30.07 kg/m  CONSTITUTIONAL: Well-developed, well-nourished female in no acute distress.  HENT:  Normocephalic, atraumatic. External right and left ear normal. Oropharynx is clear and moist EYES: Conjunctivae and  EOM are normal. Pupils are equal, round, and reactive to light. No scleral icterus.  NECK: Normal range of motion, supple, no masses SKIN: Skin is warm and dry. No rash noted. Not diaphoretic. No erythema. No pallor. NEUROLOGIC: Alert and oriented to person, place, and time. Normal reflexes, muscle tone coordination. No cranial nerve deficit noted. PSYCHIATRIC: Normal mood and affect. Normal behavior. Normal judgment and thought content. CARDIOVASCULAR: Normal heart rate noted RESPIRATORY: Effort normal, no problems with respiration noted ABDOMEN: Soft, no distention noted.   PELVIC: deferred. MUSCULOSKELETAL: Normal range of motion. No edema noted.   Assessment & Plan:  1. Unwanted fertility Had extended conversation with patient regarding bilateral tubal ligation. I reviewed other options for contraception in detail, including depo, nexplanon, hormonal and non-hormonal IUDs. Reviewed risks/benefits of both. Reviewed that she would be an excellent candidate for Paraguard as she does not want hormonal contraception. Reviewed in detail risks with bilateral tubal ligation, reviewed risks of surgery including infection, hemorrhage, damage to surrounding tissue and organs, risk of failure, risk of ectopic pregnancy. Spent a significant amount of time reviewing risk of regret, that she conceivably has 20 years of reproductive life yet and that she is at high risk of regretting decision to be permanently sterilized. Reviewed data that has shown that the younger women are when they are permanently sterilized, the higher their risk of regret. Reviewed in detail that  this procedure is not reversible and that she will not be able to have children in the future. Reviewed in detail that if she desires reversal, she will have to see specialist, pay out of pocket for reversal surgery and there is no guarantee that she would then be able to have children. Patient verbalizes understanding that she will not be able to  have children in the future and states she feels she is not physically in good shape to have any future children and will never be and she is confident in her decision. I reviewed that things can change dramatically and again, as she is young, she has a high risk of regret of having a permanent procedure done. She verbalizes understanding of all of the above and desires to proceed with BTL. Reviewed that she may change her mind at any time, up until time of procedure. Reviewed that she will need to resign medicaid papers and will plan for BTL after the 30 day waiting period. She verbalizes understanding of the above and desires to proceed.  Routine preventative health maintenance measures emphasized. Please refer to After Visit Summary for other counseling recommendations.   Return if symptoms worsen or fail to improve.  Total face-to-face time with patient: 25 minutes. Over 50% of encounter was spent on counseling and coordination of care.  Baldemar LenisK. Meryl Davis, M.D. Attending [facility](Faculty Practice)

## 2018-09-01 NOTE — Telephone Encounter (Signed)
LVM advising pt to call the office back for some urgent information.   Pt needs to resign BTL

## 2018-09-02 NOTE — Telephone Encounter (Signed)
Called pt and informed her that we needed to request that she comes back in to sign her tubal papers.  Pt stated that she will be able to come in tomorrow 12/13 btwn 9-10 to resign.  Front office notified.

## 2018-09-03 ENCOUNTER — Encounter (HOSPITAL_COMMUNITY): Payer: Self-pay

## 2018-09-27 ENCOUNTER — Encounter (HOSPITAL_COMMUNITY): Payer: Self-pay | Admitting: *Deleted

## 2018-10-18 ENCOUNTER — Encounter (HOSPITAL_COMMUNITY): Payer: Self-pay | Admitting: Anesthesiology

## 2018-10-18 NOTE — Anesthesia Preprocedure Evaluation (Deleted)
Anesthesia Evaluation    Reviewed: Allergy & Precautions, H&P , NPO status , Patient's Chart, lab work & pertinent test results  Airway        Dental   Pulmonary asthma , former smoker,           Cardiovascular Exercise Tolerance: Good negative cardio ROS Normal cardiovascular exam     Neuro/Psych Depression negative neurological ROS     GI/Hepatic negative GI ROS, Neg liver ROS,   Endo/Other  negative endocrine ROS  Renal/GU negative Renal ROS     Musculoskeletal negative musculoskeletal ROS (+)   Abdominal   Peds  Hematology negative hematology ROS (+)   Anesthesia Other Findings   Reproductive/Obstetrics negative OB ROS                             Anesthesia Physical Anesthesia Plan  ASA: II  Anesthesia Plan: General   Post-op Pain Management:    Induction: Intravenous  PONV Risk Score and Plan: Treatment may vary due to age or medical condition, Ondansetron and Dexamethasone  Airway Management Planned: Oral ETT  Additional Equipment:   Intra-op Plan:   Post-operative Plan: Extubation in OR  Informed Consent: I have reviewed the patients History and Physical, chart, labs and discussed the procedure including the risks, benefits and alternatives for the proposed anesthesia with the patient or authorized representative who has indicated his/her understanding and acceptance.     Dental advisory given  Plan Discussed with:   Anesthesia Plan Comments:         Anesthesia Quick Evaluation

## 2018-10-19 ENCOUNTER — Ambulatory Visit (HOSPITAL_COMMUNITY)
Admission: RE | Admit: 2018-10-19 | Payer: Medicaid Other | Source: Home / Self Care | Admitting: Obstetrics and Gynecology

## 2018-10-19 SURGERY — LIGATION, FALLOPIAN TUBE, LAPAROSCOPIC
Anesthesia: Choice | Laterality: Bilateral

## 2018-10-20 ENCOUNTER — Telehealth: Payer: Self-pay

## 2018-10-20 NOTE — Telephone Encounter (Signed)
Per Dr. Earlene Plater, pt needs to come in to re-sign tubal paper work.  Once paperwork is signed pt will be scheduled for BTL.  LM for pt to please return call.

## 2018-10-20 NOTE — Telephone Encounter (Signed)
Contacted pt about re-signing BTL paperwork.  Pt stated that she will be able to come in tomorrow or Friday to re-sign.  I informed pt that I would let front office know.

## 2018-10-24 IMAGING — US US OB LIMITED
1 series · 14 of 27 positions shown · non-contrast
Comparison: none

CLINICAL DATA: Patient fell this evening.

EXAM:
LIMITED OBSTETRIC ULTRASOUND

[Series 1: us ob limited · 0.23mm/px · 27 acquisitions, 14 frames shown]
[im 1/27]
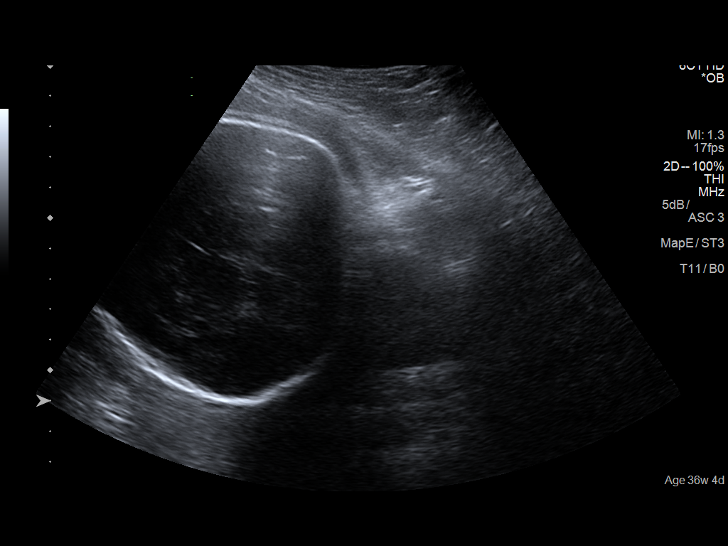
[im 3/27]
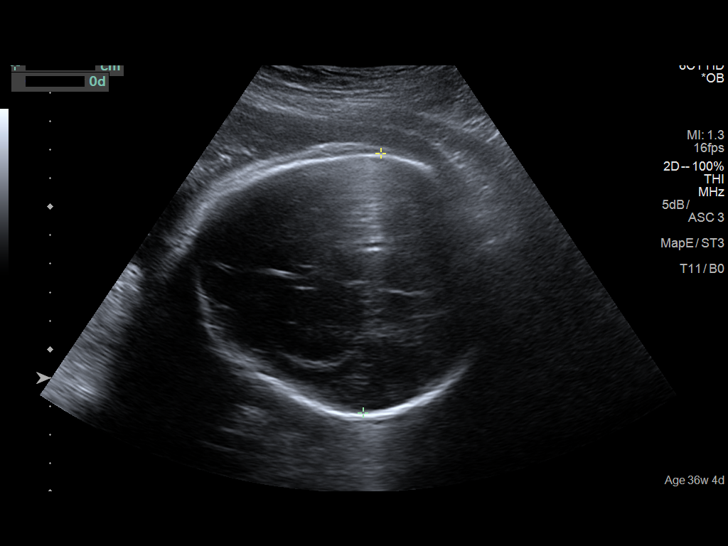
[im 5/27]
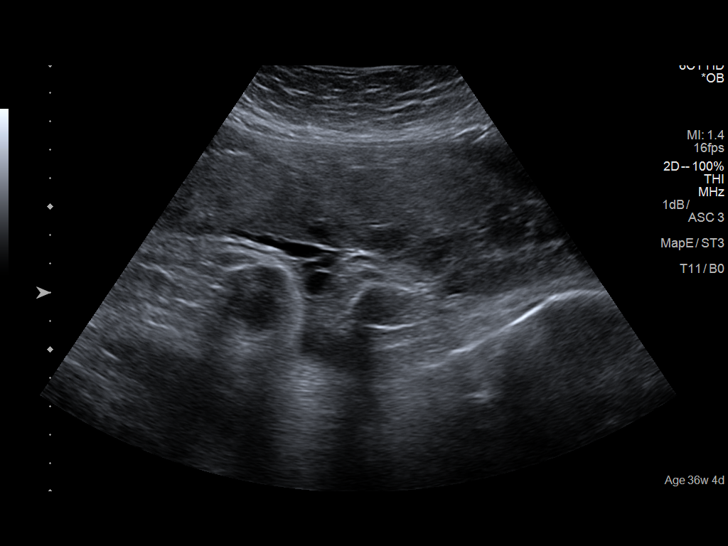
[im 7/27]
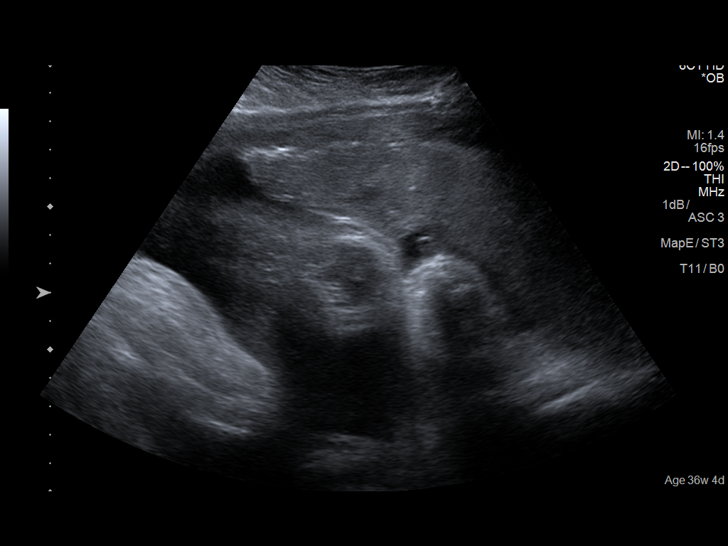
[im 9/27]
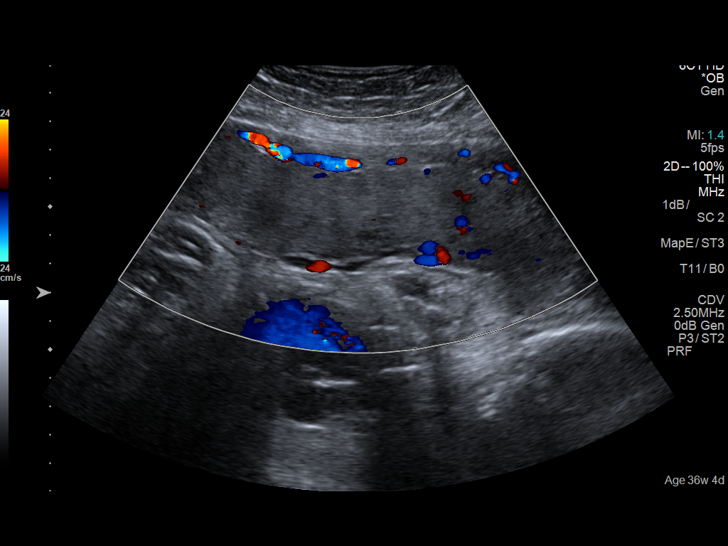
[im 11/27]
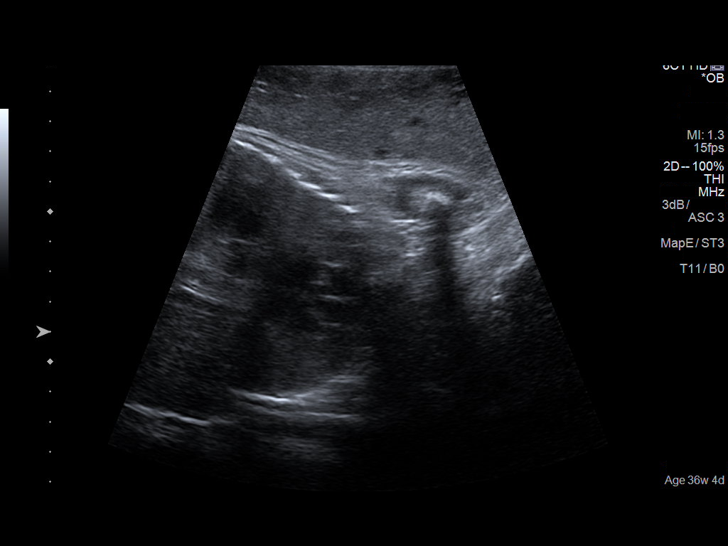
[im 13/27]
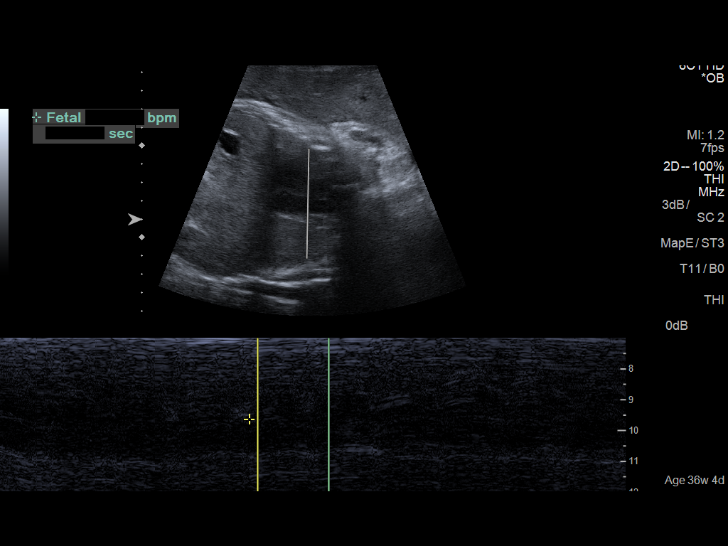
[im 15/27]
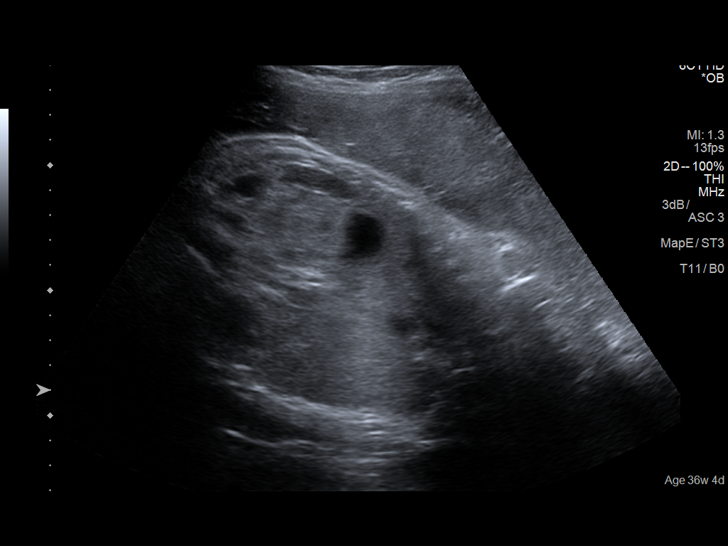
[im 17/27]
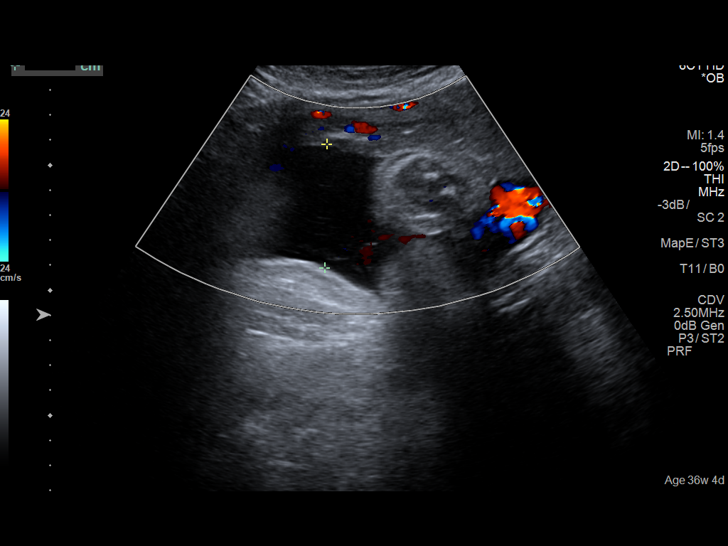
[im 19/27]
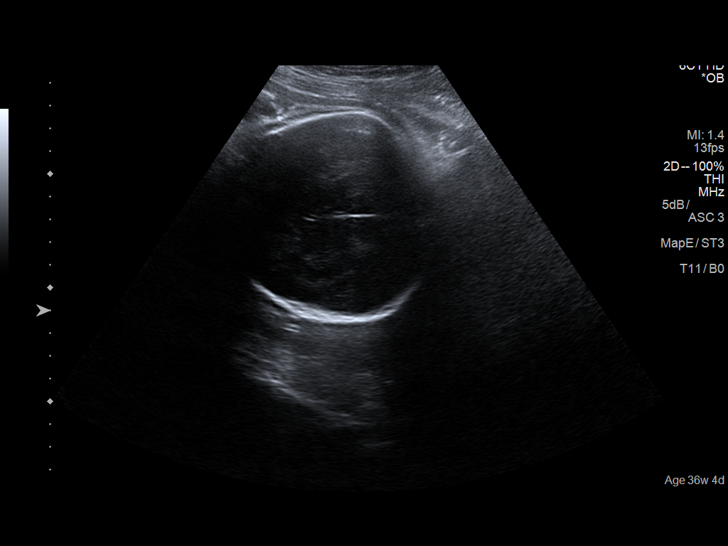
[im 21/27]
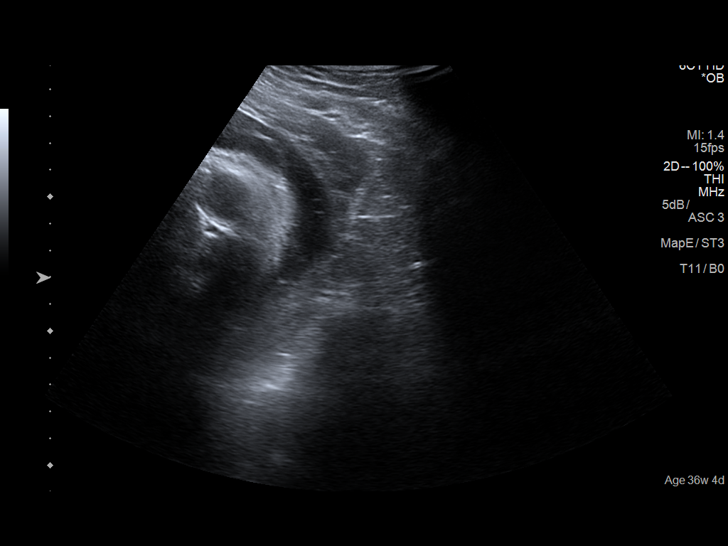
[im 23/27]
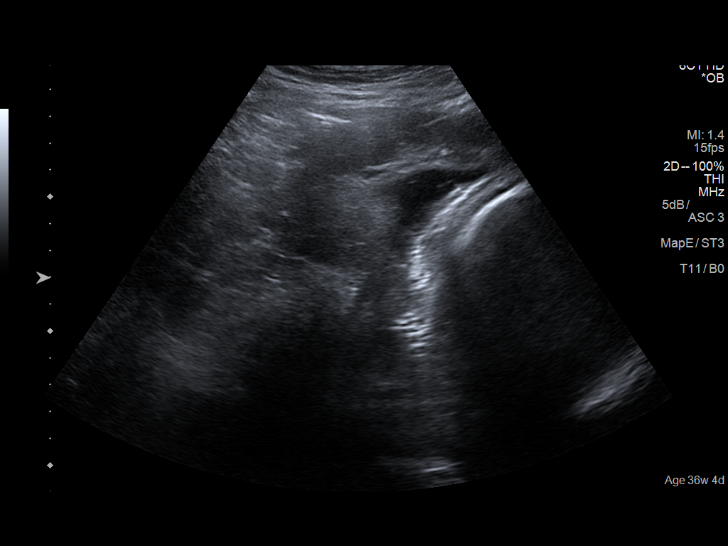
[im 25/27]
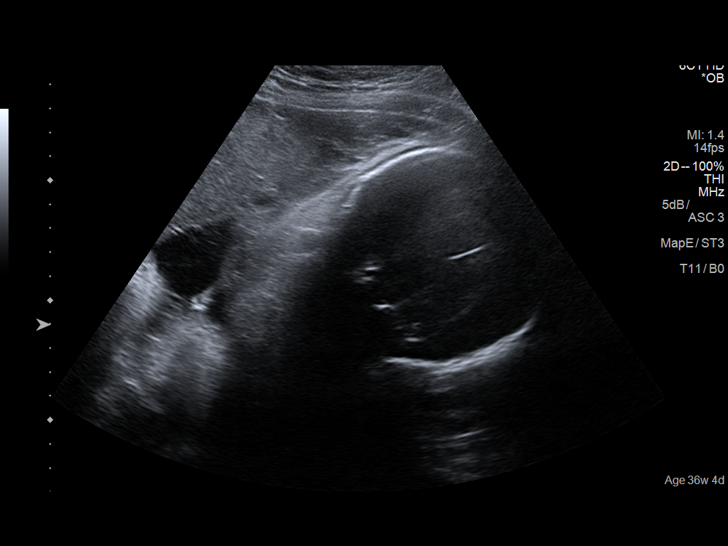
[im 27/27]
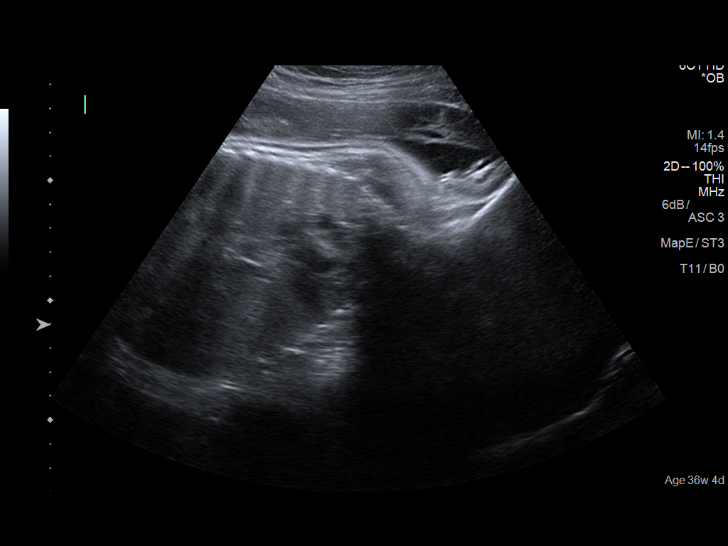

[14 of 27 positions shown; findings below may reference images not displayed]

FINDINGS: Number of Fetuses: 1

Heart Rate:  144 bpm

Movement: Yes

Presentation: Cephalic

Placental Location: Anterior

Previa: No

Amniotic Fluid (Subjective):  Within normal limits.

BPD: 9 point 1 1 cm 37 w  0 d

MATERNAL FINDINGS:

Cervix:  And unable to visualize

Uterus/Adnexae: Ovaries unable to be visualized.
IMPRESSION: Viable 37 week 0 day intrauterine gestation without acute
abnormality. Subjectively normal amniotic fluid volume with anterior
intact placenta.

This exam is performed on an emergent basis and does not
comprehensively evaluate fetal size, dating, or anatomy; follow-up
complete OB US should be considered if further fetal assessment is
warranted.

## 2018-10-25 ENCOUNTER — Telehealth: Payer: Self-pay | Admitting: General Practice

## 2018-10-25 NOTE — Telephone Encounter (Signed)
Called patient regarding BTL. Patient states she just wants to come into the office for the 5 year birth control instead. Told patient someone from our front office will contact her with an appt. Patient verbalized understanding & had no questions.

## 2018-10-25 NOTE — Telephone Encounter (Signed)
-----   Message from Olevia Bowens sent at 10/21/2018 10:11 AM EST ----- Regarding: Resign BTL Papers This patient needs to come in and resign BTL papers, her surgery on 01/28 was cancelled due to expired papers.

## 2018-11-20 IMAGING — DX DG ELBOW COMPLETE 3+V*L*
4 series · 4 of 4 positions shown · non-contrast
Comparison: None.

CLINICAL DATA: Left elbow pain after a fall tonight.

EXAM:
LEFT ELBOW - COMPLETE 3+ VIEW; LEFT FOREARM - 2 VIEW

[elbow ap]
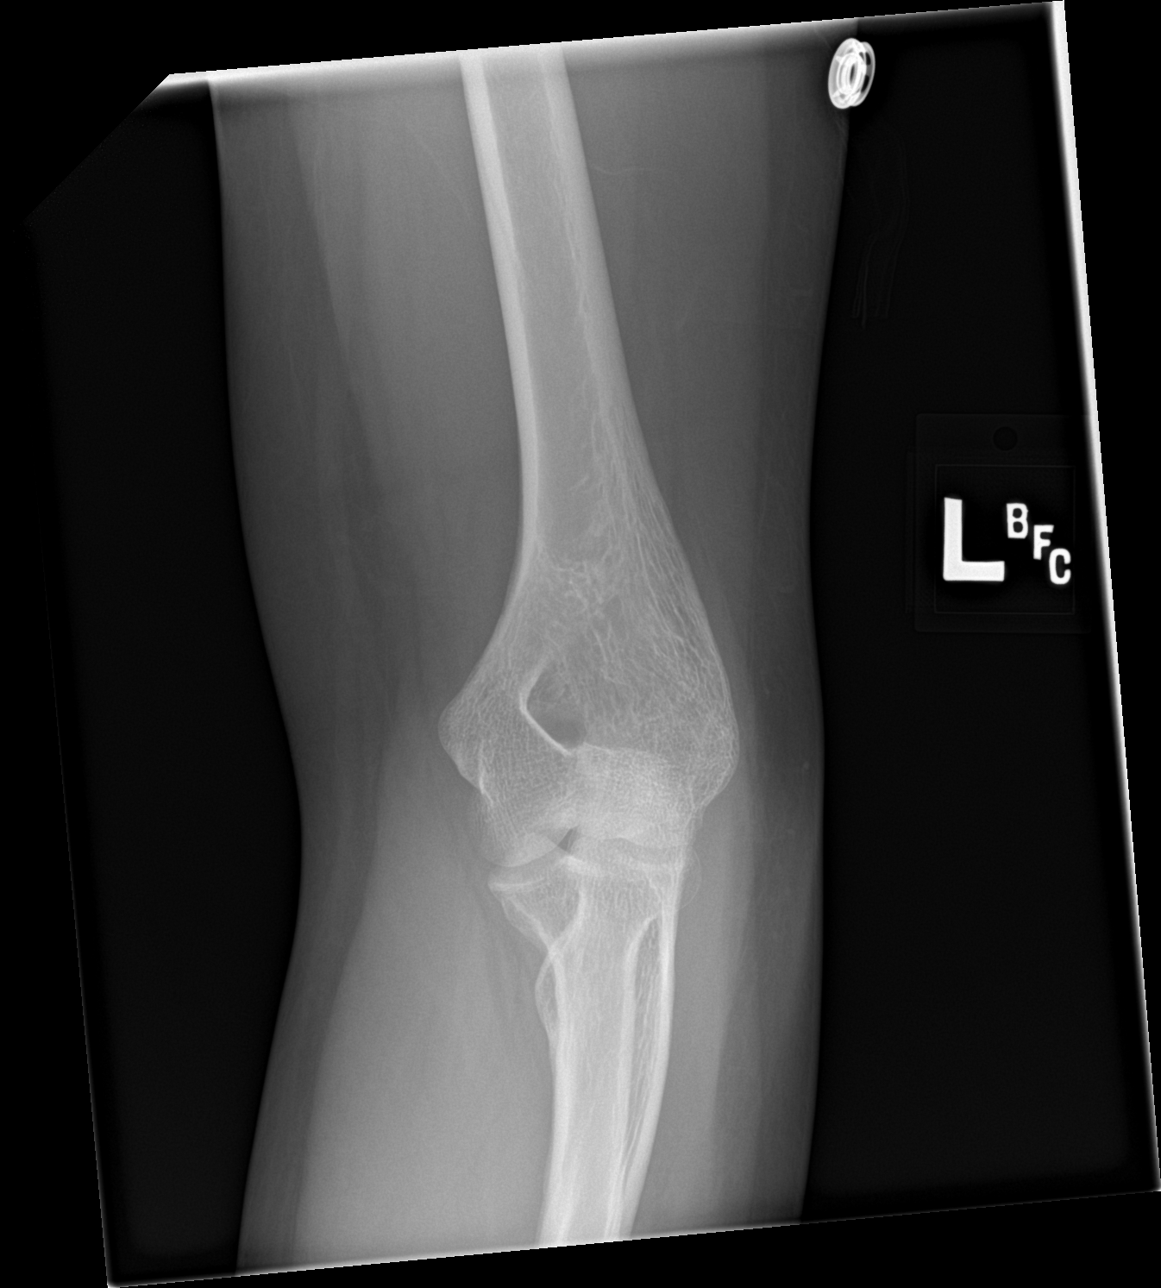

[elbow obl (1 of 2)]
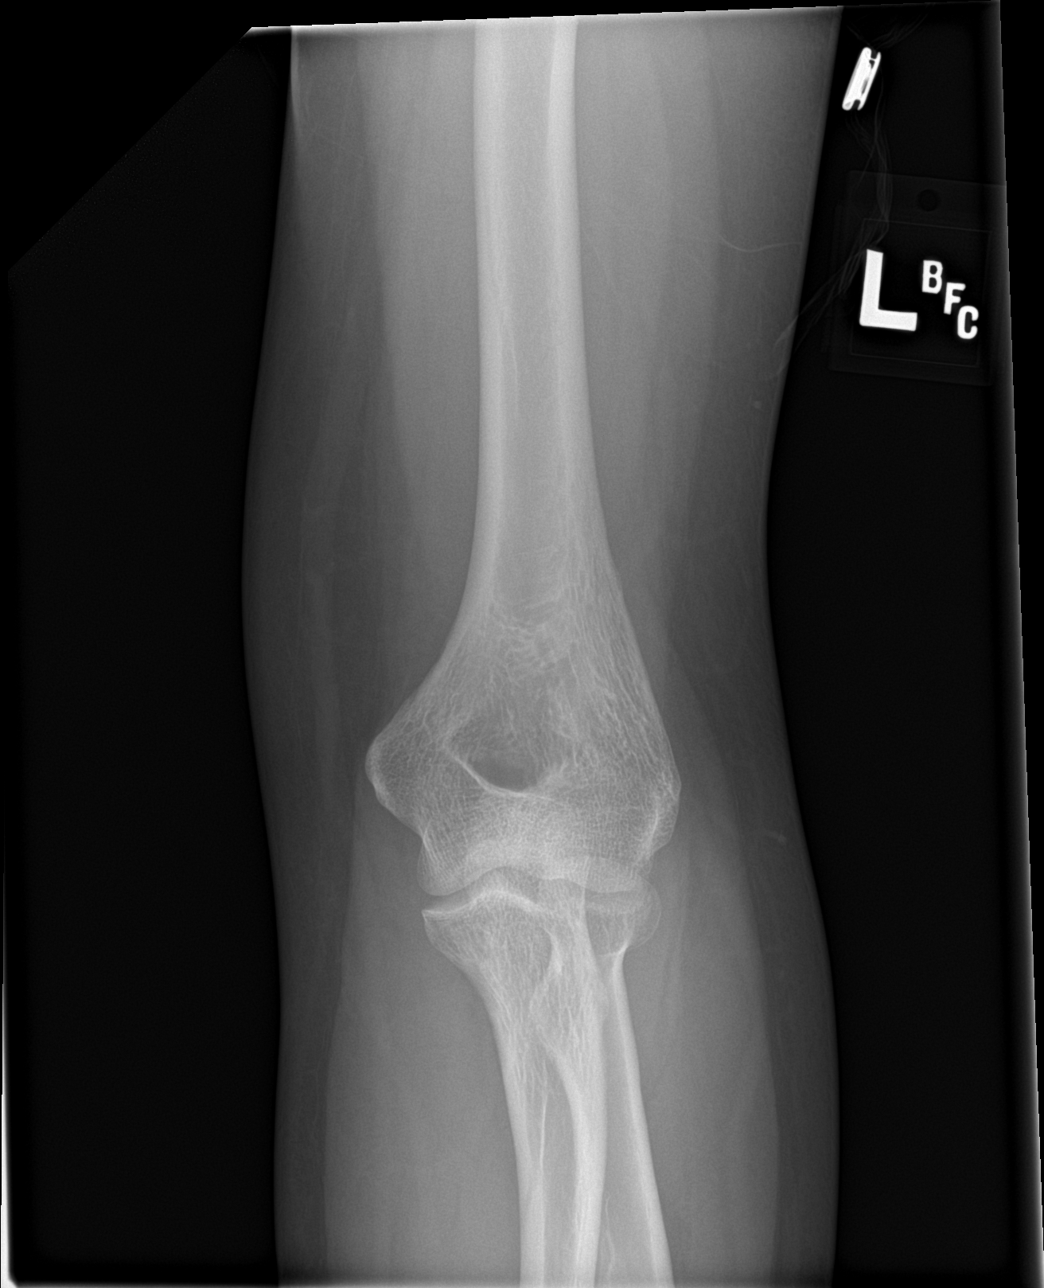

[elbow obl (2 of 2)]
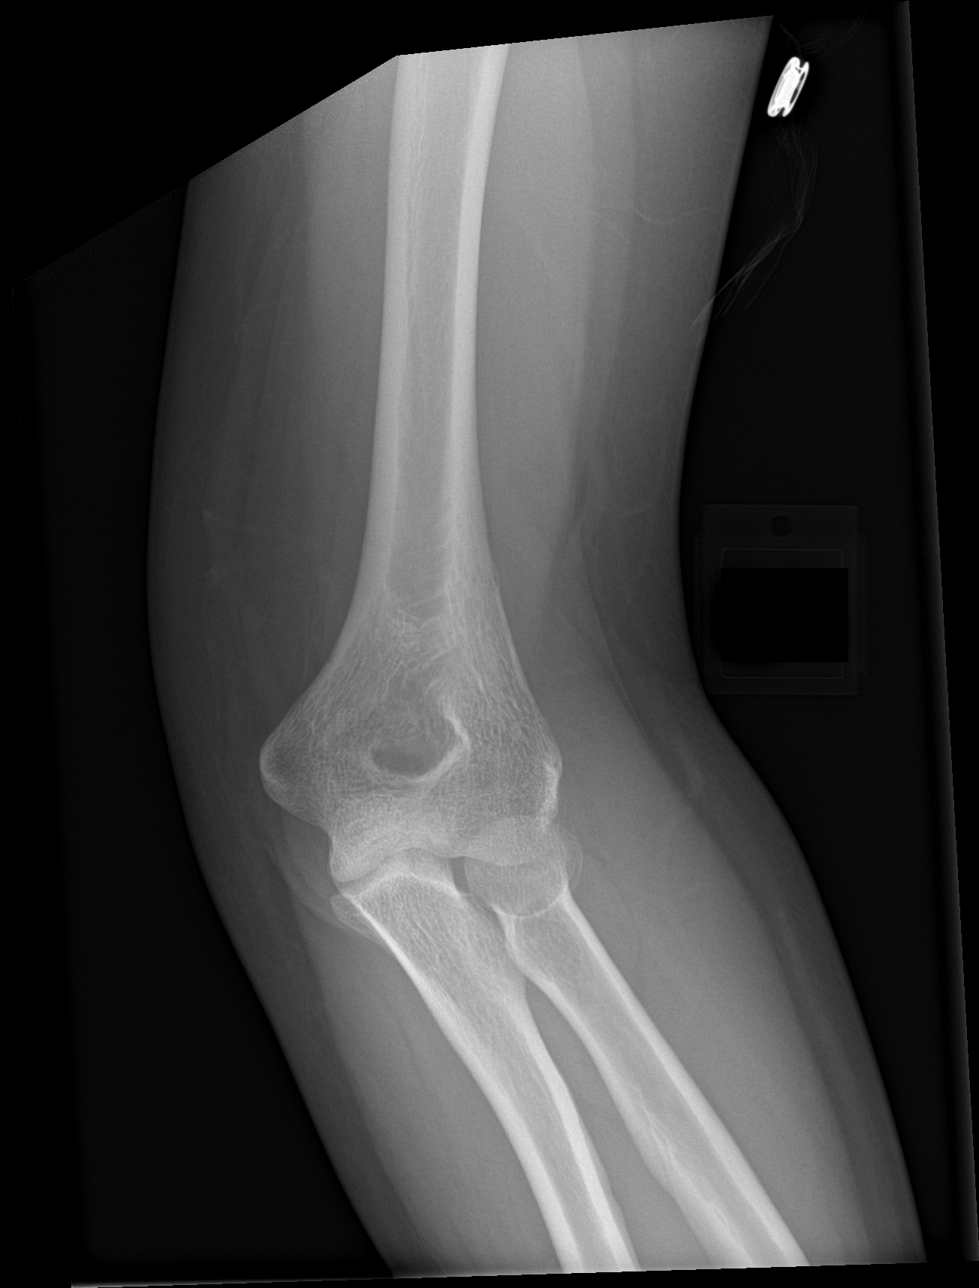

[elbow lat]
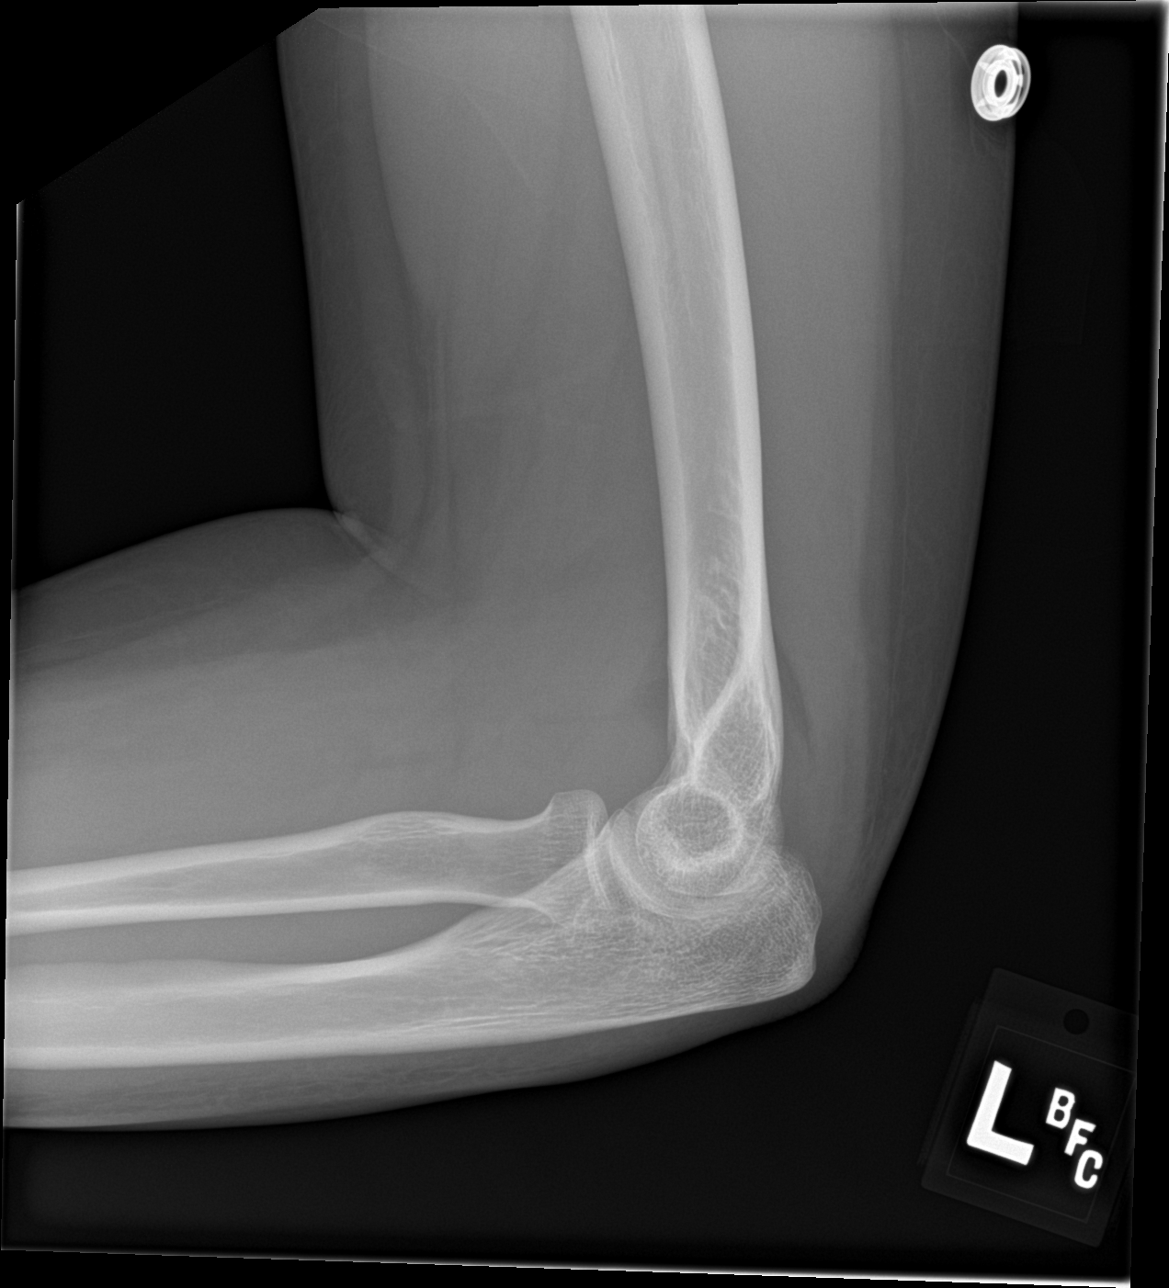

[4 of 4 positions shown; findings below may reference images not displayed]

FINDINGS: Three views of the left forearm and four views of the left elbow are
obtained. Evaluation of the forearm is somewhat limited due to
patient position.

There is a moderate left elbow effusion with elevation of the
anterior and posterior fat pads. No definite elbow fracture is
identified but the presence of an effusion raises suspicion of an
occult nondisplaced fracture. No dislocation. No expansile or
destructive bone lesions.

There is mild bowing deformity of the midshaft left radius. This
could be due to patient position but may indicate a nondisplaced
bowing fracture. Radius and ulna appear otherwise intact.
IMPRESSION: Moderate left elbow effusion with elevation of the anterior and
posterior fat pads. No definite elbow fracture is identified but the
presence of an effusion raises suspicion of an occult nondisplaced
elbow fracture. Bowing deformity of the midshaft left radius may be
positional or may indicate a nondisplaced bowing type fracture.

## 2018-11-20 IMAGING — DX DG FOREARM 2V*L*
1 series · 1 of 1 positions shown · non-contrast
Comparison: None.

CLINICAL DATA: Left elbow pain after a fall tonight.

EXAM:
LEFT ELBOW - COMPLETE 3+ VIEW; LEFT FOREARM - 2 VIEW

[elbow lat]
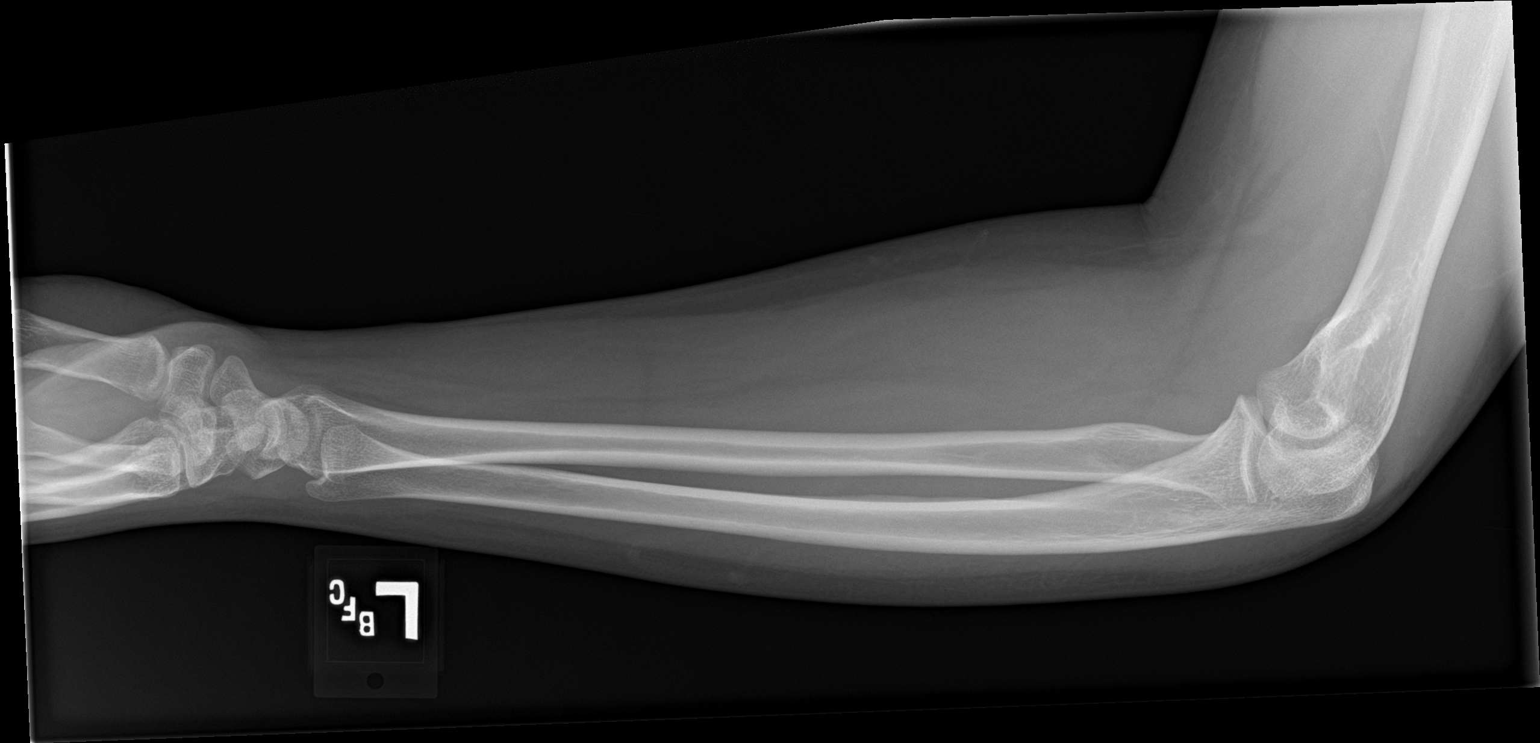

[1 of 1 positions shown; findings below may reference images not displayed]

FINDINGS: Three views of the left forearm and four views of the left elbow are
obtained. Evaluation of the forearm is somewhat limited due to
patient position.

There is a moderate left elbow effusion with elevation of the
anterior and posterior fat pads. No definite elbow fracture is
identified but the presence of an effusion raises suspicion of an
occult nondisplaced fracture. No dislocation. No expansile or
destructive bone lesions.

There is mild bowing deformity of the midshaft left radius. This
could be due to patient position but may indicate a nondisplaced
bowing fracture. Radius and ulna appear otherwise intact.
IMPRESSION: Moderate left elbow effusion with elevation of the anterior and
posterior fat pads. No definite elbow fracture is identified but the
presence of an effusion raises suspicion of an occult nondisplaced
elbow fracture. Bowing deformity of the midshaft left radius may be
positional or may indicate a nondisplaced bowing type fracture.

## 2018-12-01 ENCOUNTER — Ambulatory Visit: Payer: Medicaid Other | Admitting: Obstetrics and Gynecology

## 2019-09-23 NOTE — L&D Delivery Note (Signed)
Ashlee Simmons is a 24 y.o. [phone] s/p SVD with SD at [redacted]w[redacted]d. She was admitted for SOL.   ROM: 1h 60m with mod meconium fluid GBS Status: negative Maximum Maternal Temperature: 99.1  Labor Progress and Delivery: She progressed without augmentation to complete and pushed less than 10 minutes to deliver. Called to room and patient was complete and pushing. Head delivered. Nuchal cord present and easily reduced. Posterior shoulder blocked behind pelvic bone. McRoberts performed without release. Posterior arm delivered and body delivered in usual fashion. Infant with spontaneous cry, placed on mother's abdomen, dried and stimulated. Cord clamped x 2 after 1-minute delay, and cut by FOB. Cord blood drawn. Cord gas drawn. Placenta delivered spontaneously with gentle cord traction. Fundus firm with massage and Pitocin. Labia, perineum, vagina, and cervix inspected inspected without laceration. Mom and baby stable prior to transfer to postpartum. She plans on breastfeeding. She requests BTL for birth control.  Delivery Note At 1:37 AM a viable and healthy female was delivered via Vaginal, Spontaneous (Presentation: Left Occiput Anterior).  APGAR: 6, 8; weight 8 lb 3.6 oz (3731 g).   Placenta status: Spontaneous, Intact.  Cord: 3 vessels with the following complications: None.  Cord pH: 7.26  Anesthesia: Epidural Episiotomy: None Lacerations: None Suture Repair: None Est. Blood Loss (mL): 250  Mom to postpartum.  Baby to Couplet care / Skin to Skin.  Sharyon Cable CNM 07/16/2020, 3:24 AM

## 2019-11-09 ENCOUNTER — Encounter (INDEPENDENT_AMBULATORY_CARE_PROVIDER_SITE_OTHER): Payer: Self-pay

## 2019-11-18 ENCOUNTER — Emergency Department (HOSPITAL_COMMUNITY): Payer: Medicaid Other

## 2019-11-18 ENCOUNTER — Emergency Department (HOSPITAL_COMMUNITY)
Admission: EM | Admit: 2019-11-18 | Discharge: 2019-11-18 | Disposition: A | Discharge status: Home / Self Care | Payer: Medicaid Other | Attending: Emergency Medicine | Admitting: Emergency Medicine

## 2019-11-18 ENCOUNTER — Other Ambulatory Visit: Payer: Self-pay

## 2019-11-18 ENCOUNTER — Encounter (HOSPITAL_COMMUNITY): Payer: Self-pay

## 2019-11-18 DIAGNOSIS — Z6832 Body mass index (BMI) 32.0-32.9, adult: Secondary | ICD-10-CM | POA: Diagnosis not present

## 2019-11-18 DIAGNOSIS — R072 Precordial pain: Secondary | ICD-10-CM | POA: Insufficient documentation

## 2019-11-18 DIAGNOSIS — R05 Cough: Secondary | ICD-10-CM | POA: Insufficient documentation

## 2019-11-18 DIAGNOSIS — E669 Obesity, unspecified: Secondary | ICD-10-CM | POA: Insufficient documentation

## 2019-11-18 DIAGNOSIS — R0600 Dyspnea, unspecified: Secondary | ICD-10-CM | POA: Insufficient documentation

## 2019-11-18 DIAGNOSIS — Z87891 Personal history of nicotine dependence: Secondary | ICD-10-CM | POA: Insufficient documentation

## 2019-11-18 DIAGNOSIS — Z8709 Personal history of other diseases of the respiratory system: Secondary | ICD-10-CM | POA: Diagnosis not present

## 2019-11-18 DIAGNOSIS — R0789 Other chest pain: Secondary | ICD-10-CM

## 2019-11-18 DIAGNOSIS — R0981 Nasal congestion: Secondary | ICD-10-CM | POA: Diagnosis not present

## 2019-11-18 LAB — URINALYSIS, ROUTINE W REFLEX MICROSCOPIC
Bacteria, UA: NONE SEEN
Bilirubin Urine: NEGATIVE
Glucose, UA: NEGATIVE mg/dL
Hgb urine dipstick: NEGATIVE
Ketones, ur: NEGATIVE mg/dL
Nitrite: NEGATIVE
Protein, ur: NEGATIVE mg/dL
Specific Gravity, Urine: 1.006 (ref 1.005–1.030)
pH: 6 (ref 5.0–8.0)

## 2019-11-18 LAB — BASIC METABOLIC PANEL
Anion gap: 10 (ref 5–15)
BUN: 9 mg/dL (ref 6–20)
CO2: 20 mmol/L — ABNORMAL LOW (ref 22–32)
Calcium: 8.7 mg/dL — ABNORMAL LOW (ref 8.9–10.3)
Chloride: 107 mmol/L (ref 98–111)
Creatinine, Ser: 0.54 mg/dL (ref 0.44–1.00)
GFR calc Af Amer: >60 mL/min (ref >60)
GFR calc non Af Amer: >60 mL/min (ref >60)
Glucose, Bld: 96 mg/dL (ref 70–99)
Potassium: 3.7 mmol/L (ref 3.5–5.1)
Sodium: 137 mmol/L (ref 135–145)

## 2019-11-18 LAB — D-DIMER, QUANTITATIVE: D-Dimer, Quant: 0.97 ug/mL-FEU — ABNORMAL HIGH (ref 0.00–0.50)

## 2019-11-18 LAB — I-STAT BETA HCG BLOOD, ED (MC, WL, AP ONLY): I-stat hCG, quantitative: >2000 m[IU]/mL — ABNORMAL HIGH (ref <5)

## 2019-11-18 LAB — CBC
HCT: 41.9 % (ref 36.0–46.0)
Hemoglobin: 14.2 g/dL (ref 12.0–15.0)
MCH: 30.1 pg (ref 26.0–34.0)
MCHC: 33.9 g/dL (ref 30.0–36.0)
MCV: 89 fL (ref 80.0–100.0)
Platelets: 190 10*3/uL (ref 150–400)
RBC: 4.71 MIL/uL (ref 3.87–5.11)
RDW: 11.6 % (ref 11.5–15.5)
WBC: 6.1 10*3/uL (ref 4.0–10.5)
nRBC: 0 % (ref 0.0–0.2)

## 2019-11-18 LAB — TROPONIN I (HIGH SENSITIVITY)
Troponin I (High Sensitivity): <2 ng/L (ref <18)
Troponin I (High Sensitivity): <2 ng/L (ref <18)

## 2019-11-18 MED ORDER — IOHEXOL 350 MG/ML SOLN
50.0000 mL | Freq: Once | INTRAVENOUS | Status: AC | PRN
  Administered 2019-11-18: 50 mL via INTRAVENOUS

## 2019-11-18 MED ORDER — ALBUTEROL SULFATE HFA 108 (90 BASE) MCG/ACT IN AERS
2.0000 | INHALATION_SPRAY | Freq: Once | RESPIRATORY_TRACT | Status: AC
  Administered 2019-11-18: 2 via RESPIRATORY_TRACT
  Filled 2019-11-18: qty 6.7

## 2019-11-18 MED ORDER — SODIUM CHLORIDE 0.9% FLUSH: 3.0000 mL | Freq: Once | INTRAVENOUS | Status: DC

## 2019-11-18 NOTE — Discharge Instructions (Signed)
Your testing shows no evidence of pneumonia or blood clot in the lung.  Use your inhaler as needed for difficulty breathing follow-up with your doctor.  You should also follow-up with your gynecologist for an ultrasound of your pregnancy as scheduled.  Return to the ED if you have chest pain, shortness of breath, abdominal pain, vaginal bleeding or other concerns.

## 2019-11-18 NOTE — ED Triage Notes (Signed)
Pt c/o chest pain with shortness of breath-- is also [redacted] weeks pregnant-- chest pain started a few days ago-- with shortness of breath-- had a quick urgent care visit at [facility] [address]-- neg rapid covid.

## 2019-11-18 NOTE — ED Provider Notes (Signed)
[facility] EMERGENCY DEPARTMENT Provider Note   CSN: [id number] Arrival date & time: 11/18/19  1015     History Chief Complaint  Patient presents with  . Chest Pain  . Shortness of Breath  . [redacted] weeks pregnant    Ashlee Simmons is a 24 y.o. female.  Patient with history of asthma not on regular medications here with 1 week of shortness of breath, coughing and "wheezing".  States she feels short of breath that is worse with exertion and she has a frequent dry cough that "tastes like blood" but she has not seen any blood or any sputum.  She was seen in urgent care several days ago and had negative Covid test.  At that time she was found to be pregnant.  She estimates she is about [redacted] weeks pregnant.  She has not yet had an ultrasound.  There is no lower abdominal pain or bleeding.  This is her third pregnancy. She comes in today because she developed chest pain in the center of her chest since this morning that is worse with deep breathing.  She still feels short of breath and is coughing.  There is been no leg pain or leg swelling.  No runny nose or rhinorrhea.  No sore throat.  No body aches or headache.  She feels tight in her chest and feels more short of breath than she did several days ago.  No history of DVT or PE.  The history is provided by the patient.  Chest Pain Associated symptoms: cough and shortness of breath   Associated symptoms: no abdominal pain, no dizziness, no fever, no headache, no nausea, no vomiting and no weakness   Shortness of Breath Associated symptoms: chest pain and cough   Associated symptoms: no abdominal pain, no fever, no headaches, no rash and no vomiting        Past Medical History:  Diagnosis Date  . Anxiety   . Asthma   . Depression     Patient Active Problem List   Diagnosis Date Noted  . Labor and delivery, indication for care 06/19/2018  . Pregnant and not yet delivered 10/16/2016  . Anxiety attack 01/26/2016     Past Surgical History:  Procedure Laterality Date  . FEMUR SURGERY     plate in pelvis, rods in each femur  . HIP SURGERY    . MOUTH SURGERY       OB History    Gravida  2   Para  1   Term  1   Preterm      AB      Living  1     SAB      TAB      Ectopic      Multiple  0   Live Births  1           Family History  Problem Relation Age of Onset  . COPD Maternal Grandfather   . Alcohol abuse Neg Hx   . Arthritis Neg Hx   . Asthma Neg Hx   . Birth defects Neg Hx   . Cancer Neg Hx   . Depression Neg Hx   . Diabetes Neg Hx   . Drug abuse Neg Hx   . Early death Neg Hx   . Hearing loss Neg Hx   . Heart disease Neg Hx   . Hyperlipidemia Neg Hx   . Hypertension Neg Hx   . Kidney disease Neg Hx   .  Learning disabilities Neg Hx   . Mental illness Neg Hx   . Mental retardation Neg Hx   . Miscarriages / Stillbirths Neg Hx   . Stroke Neg Hx   . Vision loss Neg Hx   . Varicose Veins Neg Hx   . Intellectual disability Neg Hx   . Obesity Neg Hx   . ADD / ADHD Neg Hx   . Anxiety disorder Neg Hx     Social History   Tobacco Use  . Smoking status: Former Games developer  . Smokeless tobacco: Former Neurosurgeon  . Tobacco comment: No cigarettes/ smoke marjuana  Substance Use Topics  . Alcohol use: No    Comment: occ  . Drug use: Yes    Types: Marijuana    Comment: none with pregnancy    Home Medications Prior to Admission medications   Medication Sig Start Date End Date Taking? Authorizing Provider  albuterol (PROVENTIL HFA;VENTOLIN HFA) 108 (90 Base) MCG/ACT inhaler Inhale 2-3 puffs into the lungs every 6 (six) hours as needed for wheezing or shortness of breath.     [provider]  ibuprofen (ADVIL,MOTRIN) 200 MG tablet Take 400 mg by mouth every 6 (six) hours as needed for headache or moderate pain.    [provider]    Allergies    Patient has no known allergies.  Review of Systems   Review of Systems  Constitutional: Negative for  activity change, appetite change and fever.  HENT: Positive for congestion and rhinorrhea.   Eyes: Negative for photophobia.  Respiratory: Positive for cough, chest tightness and shortness of breath.   Cardiovascular: Positive for chest pain. Negative for leg swelling.  Gastrointestinal: Negative for abdominal pain, nausea and vomiting.  Genitourinary: Negative for dysuria and hematuria.  Musculoskeletal: Negative for arthralgias and myalgias.  Skin: Negative for rash.  Neurological: Negative for dizziness, weakness and headaches.    all other systems are negative except as noted in the HPI and PMH.   Physical Exam Updated Vital Signs BP (!) 111/97 (BP Location: Right Arm)   Pulse 92   Temp 98.7 F (37.1 C) (Oral)   Resp 16   Ht 5\' 2"  (1.575 m)   Wt 81.6 kg   SpO2 100%   BMI 32.92 kg/m   Physical Exam Vitals and nursing note reviewed.  Constitutional:      General: She is not in acute distress.    Appearance: She is well-developed. She is obese.     Comments: No distress, speaking full sentences  HENT:     Head: Normocephalic and atraumatic.     Mouth/Throat:     Pharynx: No oropharyngeal exudate.  Eyes:     Conjunctiva/sclera: Conjunctivae normal.     Pupils: Pupils are equal, round, and reactive to light.  Neck:     Comments: No meningismus. Cardiovascular:     Rate and Rhythm: Normal rate and regular rhythm.     Heart sounds: Normal heart sounds. No murmur.  Pulmonary:     Effort: Pulmonary effort is normal. No respiratory distress.     Breath sounds: Normal breath sounds. No wheezing.  Chest:     Chest wall: Tenderness present.  Abdominal:     Palpations: Abdomen is soft.     Tenderness: There is no abdominal tenderness. There is no guarding or rebound.  Musculoskeletal:        General: No tenderness. Normal range of motion.     Cervical back: Normal range of motion and neck supple.  Right lower leg: No edema.     Left lower leg: No edema.  Skin:     General: Skin is warm.  Neurological:     Mental Status: She is alert and oriented to person, place, and time.     Cranial Nerves: No cranial nerve deficit.     Motor: No abnormal muscle tone.     Coordination: Coordination normal.     Comments:  5/5 strength throughout. CN 2-12 intact.Equal grip strength.   Psychiatric:        Behavior: Behavior normal.     ED Results / Procedures / Treatments   Labs (all labs ordered are listed, but only abnormal results are displayed) Labs Reviewed  URINALYSIS, ROUTINE W REFLEX MICROSCOPIC - Abnormal; Notable for the following components:      Result Value   Color, Urine STRAW (*)    Leukocytes,Ua SMALL (*)    All other components within normal limits  I-STAT BETA HCG BLOOD, ED ([facility], [facility], [facility] ONLY) - Abnormal; Notable for the following components:   I-stat hCG, quantitative >2,000.0 (*)    All other components within normal limits  CBC  BASIC METABOLIC PANEL  D-DIMER, QUANTITATIVE (NOT AT [facility])  POC SARS CORONAVIRUS 2 AG -  ED  TROPONIN I (HIGH SENSITIVITY)    EKG EKG Interpretation  Date/Time:  Friday November 18 2019 10:17:43 EST Ventricular Rate:  94 PR Interval:  134 QRS Duration: 78 QT Interval:  370 QTC Calculation: 462 R Axis:   79 Text Interpretation: Normal sinus rhythm Normal ECG No previous ECGs available Confirmed by Glynn Octave [phone]) on 11/18/2019 11:26:23 AM   Radiology CT Angio Chest PE W and/or Wo Contrast  Result Date: 11/18/2019 CLINICAL DATA:  PE suspected. Pregnancy. EXAM: CT ANGIOGRAPHY CHEST WITH CONTRAST TECHNIQUE: Multidetector CT imaging of the chest was performed using the standard protocol during bolus administration of intravenous contrast. Multiplanar CT image reconstructions and MIPs were obtained to evaluate the vascular anatomy. CONTRAST:  26mL OMNIPAQUE IOHEXOL 350 MG/ML SOLN COMPARISON:  None. FINDINGS: Cardiovascular: Contrast injection is sufficient to demonstrate satisfactory opacification of  the pulmonary arteries to the segmental level. There is no pulmonary embolus. The main pulmonary artery is within normal limits for size. There is no CT evidence of acute right heart strain. The visualized aorta is normal. Heart size is normal, without pericardial effusion. Mediastinum/Nodes: --No mediastinal or hilar lymphadenopathy. --No axillary lymphadenopathy. --No supraclavicular lymphadenopathy. --Normal thyroid gland. --The esophagus is unremarkable Lungs/Pleura: No pulmonary nodules or masses. No pleural effusion or pneumothorax. No focal airspace consolidation. No focal pleural abnormality. Upper Abdomen: No acute abnormality. Musculoskeletal: No chest wall abnormality. No acute or significant osseous findings. Review of the MIP images confirms the above findings. IMPRESSION: 1. No evidence of pulmonary embolism. 2. No acute findings in the chest. Electronically Signed   By: Katherine Mantle M.D.   On: 11/18/2019 15:28   DG Chest Portable 1 View  Result Date: 11/18/2019 CLINICAL DATA:  Chest pain. EXAM: PORTABLE CHEST 1 VIEW COMPARISON:  August 23, 2018. FINDINGS: The heart size and mediastinal contours are within normal limits. Both lungs are clear. No pneumothorax or pleural effusion is noted. The visualized skeletal structures are unremarkable. IMPRESSION: No active disease. Electronically Signed   By: Lupita Raider M.D.   On: 11/18/2019 12:14    Procedures Procedures (including critical care time)  Medications Ordered in ED Medications  sodium chloride flush (NS) 0.9 % injection 3 mL (has no administration in time range)  albuterol (VENTOLIN HFA) 108 (90 Base) MCG/ACT inhaler 2 puff (has no administration in time range)    ED Course  I have reviewed the triage vital signs and the nursing notes.  Pertinent labs & imaging results that were available during my care of the patient were reviewed by me and considered in my medical decision making (see chart for details).    MDM  Rules/Calculators/A&P                      1 week of shortness of breath, coughing, wheezing with chest pain onset today.  There was no wheezing.  She is not hypoxic or tachycardic.  EKG shows normal sinus rhythm without acute ischemia.  Risks and benefits of chest x-ray discussed with patient.  She is [redacted] weeks pregnant.  Low suspicion for ACS or PE.  CXR is negative. Albuterol treatment given. Troponin negative. D-dimer mildly positive at 0.97.  D/w patient that PE is on differential but seems less likely with normal HR and O2 saturation. However patient with positive D-dimer and pleuritic chest pain and worsening SOB. Risks and benefits of CT scan to rule out pulmonary embolism while pregnant were discussed. Patient accepts risks and wishes to proceed with CT.   CT negative for PE, pneumonia or other acute pathology.  Ambulatory without desaturation.  Refuses repeat COVID swab as she said she had one several days ago.   Continue bronchodilators, supportive care. PCP and OB followup.  Return precautions discussed.   Tuyet Bader was evaluated in Emergency Department on 11/18/2019 for the symptoms described in the history of present illness. She was evaluated in the context of the global COVID-19 pandemic, which necessitated consideration that the patient might be at risk for infection with the SARS-CoV-2 virus that causes COVID-19. Institutional protocols and algorithms that pertain to the evaluation of patients at risk for COVID-19 are in a state of rapid change based on information released by regulatory bodies including the CDC and federal and state organizations. These policies and algorithms were followed during the patient's care in the ED.  Final Clinical Impression(s) / ED Diagnoses Final diagnoses:  Atypical chest pain  Dyspnea, unspecified type    Rx / DC Orders ED Discharge Orders    None       Oreoluwa Gilmer, Annie Main, MD 11/18/19 1755

## 2019-11-18 NOTE — ED Notes (Signed)
Patient refused another Covid swab; notified ER MD. No new orders, proceed with discharge dispositions

## 2019-11-18 NOTE — ED Notes (Signed)
Patient refused covid swab. Stated she was tested 3 days ago w/ negative results and hasn't been anywhere since.

## 2019-12-08 ENCOUNTER — Other Ambulatory Visit: Payer: Self-pay

## 2019-12-08 ENCOUNTER — Ambulatory Visit (INDEPENDENT_AMBULATORY_CARE_PROVIDER_SITE_OTHER): Payer: Medicaid Other | Admitting: *Deleted

## 2019-12-08 DIAGNOSIS — F419 Anxiety disorder, unspecified: Secondary | ICD-10-CM

## 2019-12-08 DIAGNOSIS — F32A Depression, unspecified: Secondary | ICD-10-CM | POA: Insufficient documentation

## 2019-12-08 DIAGNOSIS — F329 Major depressive disorder, single episode, unspecified: Secondary | ICD-10-CM | POA: Insufficient documentation

## 2019-12-08 DIAGNOSIS — O0990 Supervision of high risk pregnancy, unspecified, unspecified trimester: Secondary | ICD-10-CM

## 2019-12-08 DIAGNOSIS — J45909 Unspecified asthma, uncomplicated: Secondary | ICD-10-CM | POA: Insufficient documentation

## 2019-12-08 DIAGNOSIS — O219 Vomiting of pregnancy, unspecified: Secondary | ICD-10-CM

## 2019-12-08 DIAGNOSIS — F4310 Post-traumatic stress disorder, unspecified: Secondary | ICD-10-CM

## 2019-12-08 MED ORDER — PROMETHAZINE HCL 25 MG PO TABS: 25.0000 mg | ORAL_TABLET | Freq: Four times a day (QID) | ORAL | 0 refills | Status: DC | PRN

## 2019-12-08 MED ORDER — BLOOD PRESSURE KIT DEVI: 1.0000 | 0 refills | Status: DC | PRN

## 2019-12-08 NOTE — Patient Instructions (Signed)

## 2019-12-08 NOTE — Progress Notes (Signed)
I connected with  Elie Confer Kloehn on 12/08/19 at  3:30 PM EDT by telephone and verified that I am speaking with the correct person using two identifiers.   I discussed the limitations, risks, security and privacy concerns of performing an evaluation and management service by telephone and the availability of in person appointments. I also discussed with the patient that there may be a patient responsible charge related to this service. The patient expressed understanding and agreed to proceed.  I explained I am completing her New OB Intake today. We discussed Her EDD and that it is based on  sure LMP . I reviewed her allergies, meds, OB History, Medical /Surgical history, and appropriate screenings. She did answer for covid screening that she was tested and negative in February for symptoms and was negative; but states she now has fever 101. Has been seen for asthma symptoms of chest pain and SOB. I informed her since it has been over 2 weeks she will need to be retested for covid since she has a fever and sob. She states she doesn't think she has covid because she doesn't leave the house. I informed her per policy we can not see her in the office until she has been tested and has a negative test.  She also c/o nausea and vomiting and feeling dizzy at times, lightheaded, . She states she plans to go the hospital and I advised her she should go to the  hospital for evaluation as she may need fluids. She also asked for nausea med and I discussed her options per protocol and she selected Phenergan. Phenergan RX sent in.  I informed her of [facility] services. I offered her referral to our [facility] due to her history and + screen. She accepted referral. I explained registar will schedule and notify her .  Patient and/or legal guardian verbally consented to [facility] services about presenting concerns and psychiatric consultation as appropriate.   I explained I will send her the Bab[employer]s app and  app was sent to her while on phone.  I explained we will send a blood pressure cuff to [facility] that will fill that prescription and we called [facility] to verify they received her prescription and confirmed they will deliver to the office.Then we will show her how to use it and  then we will have her take her blood pressure weekly and enter into the app. I explained she will have some visits in office and some virtually. She already has Sports coach. I reviewed her new ob  appointment date/ time with her , our location and to wear mask, no visitors.  I explained she will have a pelvic exam, ob bloodwork, hemoglobin a1C, cbg ,pap, and  genetic testing if desired,- she does want a panorama. I scheduled an [place] at 19 weeks . She voices understanding.  Adaora Mchaney,RN 12/08/2019  3:47 PM

## 2019-12-15 ENCOUNTER — Ambulatory Visit (INDEPENDENT_AMBULATORY_CARE_PROVIDER_SITE_OTHER): Payer: Medicaid Other | Admitting: Obstetrics and Gynecology

## 2019-12-15 ENCOUNTER — Other Ambulatory Visit: Payer: Self-pay

## 2019-12-15 VITALS — BP 122/80 | HR 87 | Wt 177.7 lb

## 2019-12-15 DIAGNOSIS — O0990 Supervision of high risk pregnancy, unspecified, unspecified trimester: Secondary | ICD-10-CM

## 2019-12-15 DIAGNOSIS — E669 Obesity, unspecified: Secondary | ICD-10-CM | POA: Insufficient documentation

## 2019-12-15 MED ORDER — ASPIRIN EC 81 MG PO TBEC: 81.0000 mg | DELAYED_RELEASE_TABLET | Freq: Every day | ORAL | 1 refills | Status: DC

## 2019-12-15 NOTE — Progress Notes (Signed)
History:   Ashlee Simmons is a 24 y.o. G3P2002 at 49w5dby LMP being seen today for her first obstetrical visit.  Her obstetrical history is significant for obesity. Patient does intend to breast feed. Pregnancy history fully reviewed. Delivered both other children at [facility].   Patient reports no complaints.  HISTORY: OB History  Gravida Para Term Preterm AB Living  '3 2 2 '$ 0 0 2  SAB TAB Ectopic Multiple Live Births  0 0 0 0 2    # Outcome Date GA Lbr Len/2nd Weight Sex Delivery Anes PTL Lv  3 Current           2 Term 06/19/18 347w5d7 lb 15 oz (3.6 kg) F Vag-Spont EPI  LIV     Birth Comments: wnl  1 Term 10/17/16 [id number]:30 / 02:10 5 lb 10.3 oz (2.56 kg) F Vag-Spont EPI  LIV     Birth Comments: wnl     Name: Sami,GIRL Florentine     Apgar1: 7  Apgar5: 9    Last pap smear was done 2019 and was normal- reported   Past Medical History:  Diagnosis Date  . Anxiety   . Asthma   . Depression   . PTSD (post-traumatic stress disorder)    Past Surgical History:  Procedure Laterality Date  . FEMUR SURGERY     plate in pelvis, rods in each femur  . HIP SURGERY    . MOUTH SURGERY     Family History  Problem Relation Age of Onset  . COPD Maternal Grandfather   . Thyroid disease Mother   . Alcohol abuse Neg Hx   . Arthritis Neg Hx   . Asthma Neg Hx   . Birth defects Neg Hx   . Cancer Neg Hx   . Depression Neg Hx   . Diabetes Neg Hx   . Drug abuse Neg Hx   . Early death Neg Hx   . Hearing loss Neg Hx   . Heart disease Neg Hx   . Hyperlipidemia Neg Hx   . Hypertension Neg Hx   . Kidney disease Neg Hx   . Learning disabilities Neg Hx   . Mental illness Neg Hx   . Mental retardation Neg Hx   . Miscarriages / Stillbirths Neg Hx   . Stroke Neg Hx   . Vision loss Neg Hx   . Varicose Veins Neg Hx   . Intellectual disability Neg Hx   . Obesity Neg Hx   . ADD / ADHD Neg Hx   . Anxiety disorder Neg Hx    Social History   Tobacco Use  . Smoking status: Never Smoker  .  Smokeless tobacco: Former UseNetwork engineere Topics  . Alcohol use: Not Currently    Comment: occ  . Drug use: Not Currently    Types: Marijuana    Comment: none with pregnancy   No Known Allergies Current Outpatient Medications on File Prior to Visit  Medication Sig Dispense Refill  . acetaminophen (TYLENOL) 325 MG tablet Take 650 mg by mouth every 6 (six) hours as needed.    . aMarland Kitchenbuterol (PROVENTIL HFA;VENTOLIN HFA) 108 (90 Base) MCG/ACT inhaler Inhale 2-3 puffs into the lungs every 6 (six) hours as needed for wheezing or shortness of breath.     . Blood Pressure Monitoring (BLOOD PRESSURE KIT) DEVI 1 Device by Does not apply route as needed. 1 each 0  . ibuprofen (ADVIL,MOTRIN) 200 MG tablet Take 400 mg by  mouth every 6 (six) hours as needed for headache or moderate pain.    . Prenatal Vit-Fe Fumarate-FA (PRENATAL VITAMINS PO) Take 1 tablet by mouth.    . promethazine (PHENERGAN) 25 MG tablet Take 1 tablet (25 mg total) by mouth every 6 (six) hours as needed for nausea or vomiting. 30 tablet 0  . SF 5000 PLUS 1.1 % CREA dental cream See admin instructions.     No current facility-administered medications on file prior to visit.    Review of Systems Pertinent items noted in HPI and remainder of comprehensive ROS otherwise negative. Physical Exam:   Vitals:   12/15/19 1345  BP: 122/80  Pulse: 87  Weight: 177 lb 11.2 oz (80.6 kg)   Fetal Heart Rate (bpm): 159  BP 122/80   Pulse 87   Wt 177 lb 11.2 oz (80.6 kg)   LMP 10/08/2019   BMI 32.50 kg/m  Uterine Size: size equals dates  Pelvic Exam:    Perineum: No Hemorrhoids, Normal Perineum   Vulva: normal   Vagina:  normal mucosa, normal discharge, no palpable nodules   pH: Not done   Cervix: No bleeding following pap    Adnexa: normal adnexa and no mass, fullness, tenderness   Bony Pelvis: Adequate  System: Breast:  No nipple retraction or dimpling, No nipple discharge or bleeding, No axillary or supraclavicular  adenopathy, Normal to palpation without dominant masses   Skin: normal coloration and turgor, no rashes    Neurologic: negative   Extremities: normal strength, tone, and muscle mass   HEENT neck supple with midline trachea and thyroid without masses   Mouth/Teeth mucous membranes moist, pharynx normal without lesions   Neck supple and no masses   Cardiovascular: regular rate and rhythm, no murmurs or gallops   Respiratory:  appears well, vitals normal, no respiratory distress, acyanotic, normal RR, neck free of mass or lymphadenopathy, chest clear, no wheezing, crepitations, rhonchi, normal symmetric air entry   Abdomen: soft, non-tender; bowel sounds normal; no masses,  no organomegaly   Urinary: urethral meatus normal       Assessment:    Pregnancy: [id number] Patient Active Problem List   Diagnosis Date Noted  . Obesity (BMI 30-39.9) 12/15/2019  . Supervision of high risk pregnancy, antepartum 12/08/2019  . PTSD (post-traumatic stress disorder)   . Anxiety   . Depression   . Asthma   . Anxiety attack 01/26/2016     Plan:   1. Supervision of high risk pregnancy, antepartum  - Obstetric Panel, Including HIV- will draw in 2 weeks with NIPs d/t gestational age to early for NIPS today.  - Hemoglobin A1c - Culture, OB Urine - Cytology - PAP( [place])  2. Obesity (BMI 30-39.9)  [facility]a starting at 14 weeks.    Initial labs drawn. Continue prenatal vitamins. Genetic Screening discussed, NIPS: requested. Ultrasound discussed; fetal anatomic survey: requested. Problem list reviewed and updated. The nature of [place] with multiple MDs and other Advanced Practice Providers was explained to patient; also emphasized that residents, students are part of our team. Routine obstetric precautions reviewed. Return in about 4 weeks (around 01/12/2020) for Needs records from HD- She will need to come back for labs at 11 weeks. Marland Kitchen     Vinetta Brach,  [employer] [place], [place] for [employer], [place]

## 2019-12-17 LAB — CULTURE, OB URINE

## 2019-12-17 LAB — URINE CULTURE, OB REFLEX

## 2019-12-22 ENCOUNTER — Other Ambulatory Visit: Payer: Self-pay | Admitting: General Practice

## 2019-12-22 DIAGNOSIS — O0990 Supervision of high risk pregnancy, unspecified, unspecified trimester: Secondary | ICD-10-CM

## 2019-12-26 ENCOUNTER — Ambulatory Visit (INDEPENDENT_AMBULATORY_CARE_PROVIDER_SITE_OTHER): Payer: Medicaid Other | Admitting: Clinical

## 2019-12-26 DIAGNOSIS — F4322 Adjustment disorder with anxiety: Secondary | ICD-10-CM

## 2019-12-26 DIAGNOSIS — O99340 Other mental disorders complicating pregnancy, unspecified trimester: Secondary | ICD-10-CM

## 2019-12-26 DIAGNOSIS — Z3A00 Weeks of gestation of pregnancy not specified: Secondary | ICD-10-CM | POA: Diagnosis not present

## 2019-12-26 NOTE — BH Specialist Note (Signed)
Integrated Behavioral Health via Telemedicine Video Visit  12/26/2019 Ashlee Simmons [id number]  Number of [place] visits: 1 Session Start time: 2:17 Session End time:2:52 Total time: 35   Referring Provider: Noni Saupe, NP Type of Visit: Video Patient/Family location: Home [facility] Provider location:[facility] All persons participating in visit: Patient [place] and [place]    Confirmed patient's address: Yes  Confirmed patient's phone number: Yes  Any changes to demographics: No   Confirmed patient's insurance: Yes  Any changes to patient's insurance: No   Discussed confidentiality: Yes   I connected with [place]  by a video enabled telemedicine application and verified that I am speaking with the correct person using two identifiers.     I discussed the limitations of evaluation and management by telemedicine and the availability of in person appointments.  I discussed that the purpose of this visit is to provide behavioral health care while limiting exposure to the novel coronavirus.   Discussed there is a possibility of technology failure and discussed alternative modes of communication if that failure occurs.  I discussed that engaging in this video visit, they consent to the provision of behavioral healthcare and the services will be billed under their insurance.  Patient and/or legal guardian expressed understanding and consented to video visit: Yes   PRESENTING CONCERNS: Patient and/or family reports the following symptoms/concerns: Pt states her primary goal is to prevent escalation of symptoms of depression and anxiety; prefers non-pharmacological coping strategies Duration of problem: Increase pregnancy; Severity of problem: moderate  STRENGTHS (Protective Factors/Coping Skills): Some family support (2 siblings); utilizes daily coping strategies  GOALS ADDRESSED: Patient will: 1.  Reduce symptoms of: anxiety and  stress  2.  Increase knowledge and/or ability of: healthy habits and stress reduction  3.  Demonstrate ability to: Increase healthy adjustment to current life circumstances  INTERVENTIONS: Interventions utilized:  Supportive Counseling and Psychoeducation and/or Health Education Standardized Assessments completed: PHQ9/GAD7 taken in just over two weeks  ASSESSMENT: Patient currently experiencing Adjustment disorder with anxiety.   Patient may benefit from psychoeducation and brief therapeutic interventions regarding coping with symptoms of anxiety and life stress .  PLAN: 1. Follow up with behavioral health clinician on : One month 2. Behavioral recommendations:  -Continue taking prenatal vitamin daily -Continue using daily self-coping strategies that have helped in the past (TV with children; time with pet bunny and [place] pig; making future vacation plans) -Consider healthy sleep as prevention(cool, dark, quiet room) -Consider watching Virtual Tour of new [facility] at [email or web address]  3. Referral(s): [facility] (In Clinic)  I discussed the assessment and treatment plan with the patient and/or parent/guardian. They were provided an opportunity to ask questions and all were answered. They agreed with the plan and demonstrated an understanding of the instructions.   They were advised to call back or seek an in-person evaluation if the symptoms worsen or if the condition fails to improve as anticipated.  Caroleen Hamman Arali Somera  Depression screen [facility] 2/9 12/08/2019 09/01/2018  Decreased Interest 0 2  Down, Depressed, Hopeless 0 0  PHQ - 2 Score 0 2  Altered sleeping 1 3  Tired, decreased energy 2 1  Change in appetite 1 1  Feeling bad or failure about yourself  0 0  Trouble concentrating 1 0  Moving slowly or fidgety/restless 0 0  Suicidal thoughts 0 0  PHQ-9 Score 5 7   GAD 7 : Generalized Anxiety Score 12/08/2019 09/01/2018  Nervous, Anxious,  on  Edge 1 1  Control/stop worrying 1 0  Worry too much - different things 1 0  Trouble relaxing 3 0  Restless 3 1  Easily annoyed or irritable 3 1  Afraid - awful might happen 1 0  Total GAD 7 Score 13 3

## 2019-12-26 NOTE — Patient Instructions (Signed)

## 2019-12-28 ENCOUNTER — Other Ambulatory Visit: Payer: Medicaid Other

## 2019-12-28 ENCOUNTER — Other Ambulatory Visit: Payer: Self-pay

## 2019-12-28 DIAGNOSIS — E669 Obesity, unspecified: Secondary | ICD-10-CM

## 2019-12-28 DIAGNOSIS — O0990 Supervision of high risk pregnancy, unspecified, unspecified trimester: Secondary | ICD-10-CM

## 2019-12-29 LAB — TSH: TSH: 3.41 u[IU]/mL (ref 0.450–4.500)

## 2020-01-02 ENCOUNTER — Encounter: Payer: Self-pay | Admitting: *Deleted

## 2020-01-02 DIAGNOSIS — E039 Hypothyroidism, unspecified: Secondary | ICD-10-CM | POA: Insufficient documentation

## 2020-01-02 DIAGNOSIS — Z8781 Personal history of (healed) traumatic fracture: Secondary | ICD-10-CM | POA: Insufficient documentation

## 2020-01-02 DIAGNOSIS — F1291 Cannabis use, unspecified, in remission: Secondary | ICD-10-CM

## 2020-01-02 DIAGNOSIS — F4310 Post-traumatic stress disorder, unspecified: Secondary | ICD-10-CM

## 2020-01-02 DIAGNOSIS — O0990 Supervision of high risk pregnancy, unspecified, unspecified trimester: Secondary | ICD-10-CM

## 2020-01-02 DIAGNOSIS — F419 Anxiety disorder, unspecified: Secondary | ICD-10-CM

## 2020-01-02 DIAGNOSIS — E038 Other specified hypothyroidism: Secondary | ICD-10-CM

## 2020-01-02 DIAGNOSIS — Z87898 Personal history of other specified conditions: Secondary | ICD-10-CM | POA: Insufficient documentation

## 2020-01-04 ENCOUNTER — Encounter: Payer: Self-pay | Admitting: *Deleted

## 2020-01-05 ENCOUNTER — Telehealth: Payer: Self-pay | Admitting: Family Medicine

## 2020-01-05 NOTE — Telephone Encounter (Signed)
Patient is requesting a call back, state things are in her "Mychart" that she don't understand .. why is she high risk? And also test results that wasn't explained.

## 2020-01-05 NOTE — Telephone Encounter (Signed)
Called pt and explained to the pt that it looks like she was placed as high risk for the hypothyroidism however I did see that the labs we drew are normal.  I advised pt that that because it states that does not change her prenatal care.  I also advised pt that her results that have resulted as of now are normal however we do need her to complete some lab work.  Pt stated that she was awareof the blood work that is needed and verbalized understanding.   Addison Naegeli, RN

## 2020-01-06 ENCOUNTER — Telehealth (INDEPENDENT_AMBULATORY_CARE_PROVIDER_SITE_OTHER): Payer: Medicaid Other | Admitting: Obstetrics & Gynecology

## 2020-01-06 DIAGNOSIS — O219 Vomiting of pregnancy, unspecified: Secondary | ICD-10-CM

## 2020-01-06 NOTE — Telephone Encounter (Signed)
She is about 12 weeks, and her hands and face went numb yesterday. They feel funny today, but she would like a nurse to call her back.

## 2020-01-09 MED ORDER — ONDANSETRON HCL 4 MG PO TABS: 4.0000 mg | ORAL_TABLET | Freq: Three times a day (TID) | ORAL | 0 refills | Status: DC | PRN

## 2020-01-09 NOTE — Telephone Encounter (Signed)
Called patient to return phone call and she states someone already answered her question. She does report occasional numbness in her hands that almost feels like they are asleep. Discussed those symptoms can be normal in pregnancy and recommended exercise/stretching as well as wrist splints. Patient verbalized understanding and reports she doesn't like the way phenergan makes her feel and she would like an alternative. Zofran sent in. Patient verbalized understanding.

## 2020-01-12 ENCOUNTER — Encounter: Payer: Medicaid Other | Admitting: Obstetrics and Gynecology

## 2020-01-16 ENCOUNTER — Encounter: Payer: Medicaid Other | Admitting: Family Medicine

## 2020-01-17 ENCOUNTER — Encounter: Payer: Self-pay | Admitting: *Deleted

## 2020-01-24 NOTE — BH Specialist Note (Signed)
Integrated Behavioral Health via Telemedicine Video Visit  01/24/2020 Ashlee Simmons [id number]  Number of Integrated Behavioral Health visits: 2 Session Start time: 1:19  Session End time: 1:45 Total time: 26  Referring Provider: Venia Carbon, NP Type of Visit: Video Patient/Family location: Home [facility] Provider location: [facility]  All persons participating in visit: [name] and [facility] Ashlee Simmons    Confirmed patient's address: Yes  Confirmed patient's phone number: Yes  Any changes to demographics: No   Confirmed patient's insurance: Yes  Any changes to patient's insurance: No   Discussed confidentiality: at previous visit  I connected with Ashlee Simmons  by a video enabled telemedicine application and verified that I am speaking with the correct person using two identifiers.     I discussed the limitations of evaluation and management by telemedicine and the availability of in person appointments.  I discussed that the purpose of this visit is to provide behavioral health care while limiting exposure to the novel coronavirus.   Discussed there is a possibility of technology failure and discussed alternative modes of communication if that failure occurs.  I discussed that engaging in this video visit, they consent to the provision of behavioral healthcare and the services will be billed under their insurance.  Patient and/or legal guardian expressed understanding and consented to video visit: Yes   PRESENTING CONCERNS: Patient and/or family reports the following symptoms/concerns: Pt states her primary concern today is an increase in feeling anger and jealousy during pregnancy, attributed to not getting out the house and interpersonal conflict with mother and FOB; pt would like to work on increasing self-coping strategies after upcoming vacation.  Duration of problem: Increase in current pregnancy;  Severity of problem: moderate  STRENGTHS (Protective Factors/Coping Skills): Some family support (2 siblings), and utilizes daily self coping strategies  GOALS ADDRESSED: Patient will: 1.  Reduce symptoms of: anxiety, depression and stress  2.  Increase knowledge and/or ability of: stress reduction  3.  Demonstrate ability to: Increase healthy adjustment to current life circumstances  INTERVENTIONS: Interventions utilized:  Supportive Counseling Standardized Assessments completed: Not given today  ASSESSMENT: Patient currently experiencing Adjustment disorder with anxiety.   Patient may benefit from continued psychoeducation and brief therapeutic interventions regarding coping with symptoms of anxiety, depression and life stress .  PLAN: 1. Follow up with behavioral health clinician on : Two weeks. Will add self-coping strategy at that visit 2. Behavioral recommendations:  -Continue taking prenatal vitamin daily -Continue using self-coping strategies that have helped in the past (tv w kids, time with bunny and [place] pig, etc.) -Continue with plans to leave on vacation tomorrow morning; enjoy every moment with friend and family -Continue prioritizing healthy sleep in a cool, dark, quiet room 3. Referral(s): Integrated [facility] (In Clinic)  I discussed the assessment and treatment plan with the patient and/or parent/guardian. They were provided an opportunity to ask questions and all were answered. They agreed with the plan and demonstrated an understanding of the instructions.   They were advised to call back or seek an in-person evaluation if the symptoms worsen or if the condition fails to improve as anticipated.  Valetta Close Melessia Kaus

## 2020-01-25 ENCOUNTER — Other Ambulatory Visit: Payer: Self-pay

## 2020-01-25 ENCOUNTER — Ambulatory Visit (INDEPENDENT_AMBULATORY_CARE_PROVIDER_SITE_OTHER): Payer: Medicaid Other | Admitting: Clinical

## 2020-01-25 DIAGNOSIS — Z3A00 Weeks of gestation of pregnancy not specified: Secondary | ICD-10-CM | POA: Diagnosis not present

## 2020-01-25 DIAGNOSIS — F4322 Adjustment disorder with anxiety: Secondary | ICD-10-CM

## 2020-01-25 DIAGNOSIS — O99345 Other mental disorders complicating the puerperium: Secondary | ICD-10-CM | POA: Diagnosis not present

## 2020-01-25 NOTE — Patient Instructions (Signed)

## 2020-02-02 ENCOUNTER — Other Ambulatory Visit: Payer: Self-pay

## 2020-02-02 ENCOUNTER — Ambulatory Visit (INDEPENDENT_AMBULATORY_CARE_PROVIDER_SITE_OTHER): Payer: Medicaid Other | Admitting: Obstetrics & Gynecology

## 2020-02-02 VITALS — BP 112/67 | HR 83 | Wt 179.6 lb

## 2020-02-02 DIAGNOSIS — O99212 Obesity complicating pregnancy, second trimester: Secondary | ICD-10-CM

## 2020-02-02 DIAGNOSIS — Z3A16 16 weeks gestation of pregnancy: Secondary | ICD-10-CM

## 2020-02-02 DIAGNOSIS — E669 Obesity, unspecified: Secondary | ICD-10-CM

## 2020-02-02 DIAGNOSIS — O99210 Obesity complicating pregnancy, unspecified trimester: Secondary | ICD-10-CM

## 2020-02-02 DIAGNOSIS — R339 Retention of urine, unspecified: Secondary | ICD-10-CM | POA: Insufficient documentation

## 2020-02-02 DIAGNOSIS — O0990 Supervision of high risk pregnancy, unspecified, unspecified trimester: Secondary | ICD-10-CM

## 2020-02-02 MED ORDER — ASPIRIN EC 81 MG PO TBEC: 81.0000 mg | DELAYED_RELEASE_TABLET | Freq: Every day | ORAL | 1 refills | Status: DC

## 2020-02-02 NOTE — Patient Instructions (Signed)
Return to office for any scheduled appointments. Call the office or go to the MAU at [place] at [facility] if:  You begin to have strong, frequent contractions  Your water breaks.  Sometimes it is a big gush of fluid, sometimes it is just a trickle that keeps getting your panties wet or running down your legs  You have vaginal bleeding.  It is normal to have a small amount of spotting if your cervix was checked.   Any other obstetric concerns.   Second Trimester of Pregnancy The second trimester is from week 14 through week 27 (months 4 through 6). The second trimester is often a time when you feel your best. Your body has adjusted to being pregnant, and you begin to feel better physically. Usually, morning sickness has lessened or quit completely, you may have more energy, and you may have an increase in appetite. The second trimester is also a time when the fetus is growing rapidly. At the end of the sixth month, the fetus is about 9 inches long and weighs about 1 pounds. You will likely begin to feel the baby move (quickening) between 16 and 20 weeks of pregnancy. Body changes during your second trimester Your body continues to go through many changes during your second trimester. The changes vary from woman to woman.  Your weight will continue to increase. You will notice your lower abdomen bulging out.  You may begin to get stretch marks on your hips, abdomen, and breasts.  You may develop headaches that can be relieved by medicines. The medicines should be approved by your health care provider.  You may urinate more often because the fetus is pressing on your bladder.  You may develop or continue to have heartburn as a result of your pregnancy.  You may develop constipation because certain hormones are causing the muscles that push waste through your intestines to slow down.  You may develop hemorrhoids or swollen, bulging veins (varicose veins).  You may have  back pain. This is caused by: ? Weight gain. ? Pregnancy hormones that are relaxing the joints in your pelvis. ? A shift in weight and the muscles that support your balance.  Your breasts will continue to grow and they will continue to become tender.  Your gums may bleed and may be sensitive to brushing and flossing.  Dark spots or blotches (chloasma, mask of pregnancy) may develop on your face. This will likely fade after the baby is born.  A dark line from your belly button to the pubic area (linea nigra) may appear. This will likely fade after the baby is born.  You may have changes in your hair. These can include thickening of your hair, rapid growth, and changes in texture. Some women also have hair loss during or after pregnancy, or hair that feels dry or thin. Your hair will most likely return to normal after your baby is born. What to expect at prenatal visits During a routine prenatal visit:  You will be weighed to make sure you and the fetus are growing normally.  Your blood pressure will be taken.  Your abdomen will be measured to track your baby's growth.  The fetal heartbeat will be listened to.  Any test results from the previous visit will be discussed. Your health care provider may ask you:  How you are feeling.  If you are feeling the baby move.  If you have had any abnormal symptoms, such as leaking fluid, bleeding,  severe headaches, or abdominal cramping.  If you are using any tobacco products, including cigarettes, chewing tobacco, and electronic cigarettes.  If you have any questions. Other tests that may be performed during your second trimester include:  Blood tests that check for: ? Low iron levels (anemia). ? High blood sugar that affects pregnant women (gestational diabetes) between 28 and 28 weeks. ? Rh antibodies. This is to check for a protein on red blood cells (Rh factor).  Urine tests to check for infections, diabetes, or protein in the  urine.  An ultrasound to confirm the proper growth and development of the baby.  An amniocentesis to check for possible genetic problems.  Fetal screens for spina bifida and Down syndrome.  HIV (human immunodeficiency virus) testing. Routine prenatal testing includes screening for HIV, unless you choose not to have this test. Follow these instructions at home: Medicines  Follow your health care provider's instructions regarding medicine use. Specific medicines may be either safe or unsafe to take during pregnancy.  Take a prenatal vitamin that contains at least 600 micrograms (mcg) of folic acid.  If you develop constipation, try taking a stool softener if your health care provider approves. Eating and drinking   Eat a balanced diet that includes fresh fruits and vegetables, whole grains, good sources of protein such as meat, eggs, or tofu, and low-fat dairy. Your health care provider will help you determine the amount of weight gain that is right for you.  Avoid raw meat and uncooked cheese. These carry germs that can cause birth defects in the baby.  If you have low calcium intake from food, talk to your health care provider about whether you should take a daily calcium supplement.  Limit foods that are high in fat and processed sugars, such as fried and sweet foods.  To prevent constipation: ? Drink enough fluid to keep your urine clear or pale yellow. ? Eat foods that are high in fiber, such as fresh fruits and vegetables, whole grains, and beans. Activity  Exercise only as directed by your health care provider. Most women can continue their usual exercise routine during pregnancy. Try to exercise for 30 minutes at least 5 days a week. Stop exercising if you experience uterine contractions.  Avoid heavy lifting, wear low heel shoes, and practice good posture.  A sexual relationship may be continued unless your health care provider directs you otherwise. Relieving pain and  discomfort  Wear a good support bra to prevent discomfort from breast tenderness.  Take warm sitz baths to soothe any pain or discomfort caused by hemorrhoids. Use hemorrhoid cream if your health care provider approves.  Rest with your legs elevated if you have leg cramps or low back pain.  If you develop varicose veins, wear support hose. Elevate your feet for 15 minutes, 3-4 times a day. Limit salt in your diet. Prenatal Care  Write down your questions. Take them to your prenatal visits.  Keep all your prenatal visits as told by your health care provider. This is important. Safety  Wear your seat belt at all times when driving.  Make a list of emergency phone numbers, including numbers for family, friends, the hospital, and police and fire departments. General instructions  Ask your health care provider for a referral to a local prenatal education class. Begin classes no later than the beginning of month 6 of your pregnancy.  Ask for help if you have counseling or nutritional needs during pregnancy. Your health care provider can offer  advice or refer you to specialists for help with various needs.  Do not use hot tubs, steam rooms, or saunas.  Do not douche or use tampons or scented sanitary pads.  Do not cross your legs for long periods of time.  Avoid cat litter boxes and soil used by cats. These carry germs that can cause birth defects in the baby and possibly loss of the fetus by miscarriage or stillbirth.  Avoid all smoking, herbs, alcohol, and unprescribed drugs. Chemicals in these products can affect the formation and growth of the baby.  Do not use any products that contain nicotine or tobacco, such as cigarettes and e-cigarettes. If you need help quitting, ask your health care provider.  Visit your dentist if you have not gone yet during your pregnancy. Use a soft toothbrush to brush your teeth and be gentle when you floss. Contact a health care provider if:  You  have dizziness.  You have mild pelvic cramps, pelvic pressure, or nagging pain in the abdominal area.  You have persistent nausea, vomiting, or diarrhea.  You have a bad smelling vaginal discharge.  You have pain when you urinate. Get help right away if:  You have a fever.  You are leaking fluid from your vagina.  You have spotting or bleeding from your vagina.  You have severe abdominal cramping or pain.  You have rapid weight gain or weight loss.  You have shortness of breath with chest pain.  You notice sudden or extreme swelling of your face, hands, ankles, feet, or legs.  You have not felt your baby move in over an hour.  You have severe headaches that do not go away when you take medicine.  You have vision changes. Summary  The second trimester is from week 14 through week 27 (months 4 through 6). It is also a time when the fetus is growing rapidly.  Your body goes through many changes during pregnancy. The changes vary from woman to woman.  Avoid all smoking, herbs, alcohol, and unprescribed drugs. These chemicals affect the formation and growth your baby.  Do not use any tobacco products, such as cigarettes, chewing tobacco, and e-cigarettes. If you need help quitting, ask your health care provider.  Contact your health care provider if you have any questions. Keep all prenatal visits as told by your health care provider. This is important. This information is not intended to replace advice given to you by your health care provider. Make sure you discuss any questions you have with your health care provider. Document Revised: 12/31/2018 Document Reviewed: 10/14/2016 Elsevier Patient Education  2020 Elsevier Inc.  

## 2020-02-02 NOTE — Progress Notes (Signed)
   PRENATAL VISIT NOTE  Subjective:  Ashlee Simmons is a 24 y.o. G3P2002 at [redacted]w[redacted]d being seen today for ongoing prenatal care.  She is currently monitored for the following issues for this high-risk pregnancy and has Anxiety attack; Supervision of high risk pregnancy, antepartum; PTSD (post-traumatic stress disorder); Anxiety; Depression; Asthma; Obesity (BMI 30-39.9); History of femur fracture; History of marijuana use; Hypothyroidism; and Urinary retention with incomplete bladder emptying on their problem list.  Patient reports she has urge to pee but nothing comes out.  She insists it is not a UTI or BV, had these symptoms off and on for several years since accident in April 2017 when she had an indwelling catheter for over a month. Has needed catheterization at other times.  No current symptoms, always has negative evaluation for UTI. Last urine culture in March 2021 was negative.   Contractions: Not present. Vag. Bleeding: None.  Movement: Absent. Denies leaking of fluid.   The following portions of the patient's history were reviewed and updated as appropriate: allergies, current medications, past family history, past medical history, past social history, past surgical history and problem list.   Objective:   Vitals:   02/02/20 1606  BP: 112/67  Pulse: 83  Weight: 179 lb 9.6 oz (81.5 kg)    Fetal Status: Fetal Heart Rate (bpm): 143   Movement: Absent     General:  Alert, oriented and cooperative. Patient is in no acute distress.  Skin: Skin is warm and dry. No rash noted.   Cardiovascular: Normal heart rate noted  Respiratory: Normal respiratory effort, no problems with respiration noted  Abdomen: Soft, gravid, appropriate for gestational age.  Pain/Pressure: Absent     Pelvic: Cervical exam deferred        Extremities: Normal range of motion.  Edema: Trace  Mental Status: Normal mood and affect. Normal behavior. Normal judgment and thought content.   Assessment and Plan:   Pregnancy: G3P2002 at [redacted]w[redacted]d 1. Urinary retention with incomplete bladder emptying Has happened since April 2017, will need Urology referral after pregnancy for further evaluation. Cautioned that she needs to come in to office or MAU for any urinary retention as she may need a straight catheter or temporary catheter. Will continue to monitor symptoms closely as pregnancy can exacerbate these symptoms.  2. Obesity in pregnancy, antepartum Surveillance labs checked. - Hemoglobin A1c - Comprehensive metabolic panel  3. Supervision of high risk pregnancy, antepartum Patient left prior to some labs being drawn last time.  Low risk female on NIPS. Anatomy scan scheduled. ASA prescribed for PEC prevention. - CBC/D/Plt+RPR+Rh+ABO+Rub Ab... - AFP, Serum, Open Spina Bifida - Hemoglobin A1c - Comprehensive metabolic panel - aspirin EC 81 MG tablet; Take 1 tablet (81 mg total) by mouth daily.  Dispense: 200 tablet; Refill: 1 No other complaints or concerns.  Routine obstetric precautions reviewed.    Return in about 4 weeks (around 03/01/2020) for OFFICE OB Visit.  Future Appointments  Date Time Provider Department Center  02/08/2020  2:15 PM [facility] HEALTH CLINICIAN [facility]  02/16/2020 10:15 AM [facility] NURSE [facility]  02/16/2020 10:15 AM [facility] US2 [facility]    Jaynie Collins, MD

## 2020-02-02 NOTE — BH Specialist Note (Signed)
Integrated Behavioral Health via Telemedicine Video Visit  02/02/2020 Ashlee Simmons [id number]  Number of Integrated Behavioral Health visits: 3 Session Start time: 2:18  Session End time: 2:50 Total time: 32  Referring Provider: Venia Carbon, NP Type of Visit: Video Patient/Family location: Home [facility] Provider location: Center for [facility] at [facility]  All persons participating in visit: Patient Ashlee Simmons and [facility] Ashlee Simmons    Confirmed patient's address: Yes  Confirmed patient's phone number: Yes  Any changes to demographics: No   Confirmed patient's insurance: Yes  Any changes to patient's insurance: No   Discussed confidentiality: at previous visit  I connected with Ashlee Simmons  by a video enabled telemedicine application and verified that I am speaking with the correct person using two identifiers.     I discussed the limitations of evaluation and management by telemedicine and the availability of in person appointments.  I discussed that the purpose of this visit is to provide behavioral health care while limiting exposure to the novel coronavirus.   Discussed there is a possibility of technology failure and discussed alternative modes of communication if that failure occurs.  I discussed that engaging in this video visit, they consent to the provision of behavioral healthcare and the services will be billed under their insurance.  Patient and/or legal guardian expressed understanding and consented to video visit: Yes   PRESENTING CONCERNS: Patient and/or family reports the following symptoms/concerns: Pt states her primary concern today is interpersonal conflict with friend, along with request for psychiatry referral for emotional support animal letter; pt looks forward to couple trip next weekend to help improve her mood.  Duration of problem: Ongoing; Severity of problem: moderate  STRENGTHS (Protective  Factors/Coping Skills): Increasing family support; daily self-coping strategies  GOALS ADDRESSED: Patient will: 1.  Reduce symptoms of: agitation, anxiety and stress  2.  Increase knowledge and/or ability of: stress reduction  3.  Demonstrate ability to: Increase healthy adjustment to current life circumstances and Increase motivation to adhere to plan of care  INTERVENTIONS: Interventions utilized:  Solution-Focused Strategies and Link to [employer] Standardized Assessments completed: PHQ9/GAD7 in past two weeks  ASSESSMENT: Patient currently experiencing Adjustment disorder with anxiety and History of PTSD.   Patient may benefit from continued psychoeducation and brief therapeutic interventions regarding coping with symptoms of anxiety, irritability, and life stress   PLAN: 1. Follow up with behavioral health clinician on : Two weeks 2. Behavioral recommendations:  -Sign [id number] consent via text -Accept referral to psychiatry (for assess need for emotional support animal/letter) -Accept referral to [employer] (ask for legal resources regarding emotional support animals) -Continue taking prenatal vitamin daily -Continue spending time with children and pets daily; plan for couple trip next weekend -Continue prioritizing healthy sleep as much as possible the next two weeks 3. Referral(s): Integrated Art gallery manager (In Clinic), Community Mental Health Services (LME/Outside Clinic) and Community Resources:  legal  I discussed the assessment and treatment plan with the patient and/or parent/guardian. They were provided an opportunity to ask questions and all were answered. They agreed with the plan and demonstrated an understanding of the instructions.   They were advised to call back or seek an in-person evaluation if the symptoms worsen or if the condition fails to improve as anticipated.  Ashlee Simmons  Depression screen [facility] 2/9 02/02/2020  12/08/2019 09/01/2018  Decreased Interest 1 0 2  Down, Depressed, Hopeless 0 0 0  PHQ - 2 Score  1 0 2  Altered sleeping 1 1 3   Tired, decreased energy 1 2 1   Change in appetite 0 1 1  Feeling bad or failure about yourself  0 0 0  Trouble concentrating 0 1 0  Moving slowly or fidgety/restless 0 0 0  Suicidal thoughts 0 0 0  PHQ-9 Score 3 5 7    GAD 7 : Generalized Anxiety Score 02/02/2020 12/08/2019 09/01/2018  Nervous, Anxious, on Edge 0 1 1  Control/stop worrying 0 1 0  Worry too much - different things 0 1 0  Trouble relaxing 0 3 0  Restless 0 3 1  Easily annoyed or irritable 0 3 1  Afraid - awful might happen 0 1 0  Total GAD 7 Score 0 13 3

## 2020-02-04 LAB — CBC/D/PLT+RPR+RH+ABO+RUB AB...
Antibody Screen: NEGATIVE
Basophils Absolute: 0 10*3/uL (ref 0.0–0.2)
Basos: 0 %
EOS (ABSOLUTE): 0.1 10*3/uL (ref 0.0–0.4)
Eos: 2 %
HCV Ab: <0.1 s/co ratio (ref 0.0–0.9)
HIV Screen 4th Generation wRfx: NONREACTIVE
Hematocrit: 37.5 % (ref 34.0–46.6)
Hemoglobin: 13.4 g/dL (ref 11.1–15.9)
Hepatitis B Surface Ag: NEGATIVE
Immature Grans (Abs): 0 10*3/uL (ref 0.0–0.1)
Immature Granulocytes: 1 %
Lymphocytes Absolute: 1.2 10*3/uL (ref 0.7–3.1)
Lymphs: 19 %
MCH: 31.6 pg (ref 26.6–33.0)
MCHC: 35.7 g/dL (ref 31.5–35.7)
MCV: 88 fL (ref 79–97)
Monocytes Absolute: 0.5 10*3/uL (ref 0.1–0.9)
Monocytes: 7 %
Neutrophils Absolute: 4.7 10*3/uL (ref 1.4–7.0)
Neutrophils: 71 %
Platelets: 156 10*3/uL (ref 150–450)
RBC: 4.24 x10E6/uL (ref 3.77–5.28)
RDW: 12.7 % (ref 11.7–15.4)
RPR Ser Ql: NONREACTIVE
Rh Factor: POSITIVE
Rubella Antibodies, IGG: 2.61 index (ref >0.99)
WBC: 6.6 10*3/uL (ref 3.4–10.8)

## 2020-02-04 LAB — COMPREHENSIVE METABOLIC PANEL
ALT: 10 IU/L (ref 0–32)
AST: 12 IU/L (ref 0–40)
Albumin/Globulin Ratio: 1.7 (ref 1.2–2.2)
Albumin: 4 g/dL (ref 3.9–5.0)
Alkaline Phosphatase: 63 IU/L (ref 39–117)
BUN/Creatinine Ratio: 13 (ref 9–23)
BUN: 6 mg/dL (ref 6–20)
Bilirubin Total: 0.3 mg/dL (ref 0.0–1.2)
CO2: 20 mmol/L (ref 20–29)
Calcium: 9.1 mg/dL (ref 8.7–10.2)
Chloride: 106 mmol/L (ref 96–106)
Creatinine, Ser: 0.46 mg/dL — ABNORMAL LOW (ref 0.57–1.00)
GFR calc Af Amer: 161 mL/min/{1.73_m2} (ref >59)
GFR calc non Af Amer: 140 mL/min/{1.73_m2} (ref >59)
Globulin, Total: 2.3 g/dL (ref 1.5–4.5)
Glucose: 78 mg/dL (ref 65–99)
Potassium: 3.9 mmol/L (ref 3.5–5.2)
Sodium: 140 mmol/L (ref 134–144)
Total Protein: 6.3 g/dL (ref 6.0–8.5)

## 2020-02-04 LAB — AFP, SERUM, OPEN SPINA BIFIDA
AFP MoM: 0.88
AFP Value: 29.8 ng/mL
Gest. Age on Collection Date: 16.7 weeks
Maternal Age At EDD: 24.5 yr
OSBR Risk 1 IN: 10000
Test Results:: NEGATIVE
Weight: 177 [lb_av]

## 2020-02-04 LAB — HEMOGLOBIN A1C
Est. average glucose Bld gHb Est-mCnc: 88 mg/dL
Hgb A1c MFr Bld: 4.7 % — ABNORMAL LOW (ref 4.8–5.6)

## 2020-02-04 LAB — HCV INTERPRETATION

## 2020-02-08 ENCOUNTER — Ambulatory Visit (INDEPENDENT_AMBULATORY_CARE_PROVIDER_SITE_OTHER): Payer: Medicaid Other | Admitting: Clinical

## 2020-02-08 ENCOUNTER — Other Ambulatory Visit: Payer: Self-pay

## 2020-02-08 DIAGNOSIS — F4322 Adjustment disorder with anxiety: Secondary | ICD-10-CM

## 2020-02-09 NOTE — BH Specialist Note (Signed)
Integrated Behavioral Health via Telemedicine Phone Visit  02/09/2020 Ashlee Simmons [id number]  Number of Integrated Behavioral Health visits: 4 Session Start time: 2:20  Session End time: 2:47 Total time: 27  Referring Provider: Venia Carbon, NP Type of Visit: Phone Patient/Family location: Home [facility] Provider location: Center for [facility] at [facility]  All persons participating in visit: [name] and [facility] Sharonda Llamas    Confirmed patient's address: Yes  Confirmed patient's phone number: Yes  Any changes to demographics: No   Confirmed patient's insurance: Yes  Any changes to patient's insurance: No   Discussed confidentiality: at previous visit  I connected with [place] by a virtual enabled telemedicine application and verified that I am speaking with the correct person using two identifiers.     I discussed the limitations of evaluation and management by telemedicine and the availability of in person appointments.  I discussed that the purpose of this visit is to provide behavioral health care while limiting exposure to the novel coronavirus.   Discussed there is a possibility of technology failure and discussed alternative modes of communication if that failure occurs.  I discussed that engaging in this virtual visit, they consent to the provision of behavioral healthcare and the services will be billed under their insurance.  Patient and/or legal guardian expressed understanding and consented to virtual visit: Yes   PRESENTING CONCERNS: Patient and/or family reports the following symptoms/concerns: Pt states she is currently coping well to reduce symptoms of anxiety and managing stress with daily self-coping strategies. Pt is interested in referral from medical providers for perinatal massage.  Duration of problem: Ongoing ; Severity of problem: mild  STRENGTHS (Protective Factors/Coping Skills): Good family  support; utilizing daily self-coping strategies  GOALS ADDRESSED: Patient will: 1.  Reduce symptoms of: agitation, anxiety and stress  2.  Increase knowledge and/or ability of: stress reduction  3.  Demonstrate ability to: Increase healthy adjustment to current life circumstances  INTERVENTIONS: Interventions utilized:  Supportive Counseling, Psychoeducation and/or Health Education and Link to [employer] Standardized Assessments completed: Not Needed  ASSESSMENT: Patient currently experiencing Adjustment disorder with anxiety and History of PTSD.   Patient may benefit from continued psychoeducation and brief therapeutic interventions regarding coping with symptoms of anxiety .  PLAN: 1. Follow up with behavioral health clinician on: One month 2. Behavioral recommendations:  -Continue to check phone for psychiatry call to set up initial appointment -Call [facility] back; ask for legal hotline number to call again -Continue taking prenatal vitamin daily (set timer on phone as reminder, if needed) -Continue seeking opportunities for outdoor activities with family -Continue prioritizing healthy sleep nightly for remainder of pregnancy -Consider [email or web address] to set up childbirth  And other classes offered  3. Referral(s): Integrated [employer] (In Clinic)  I discussed the assessment and treatment plan with the patient and/or parent/guardian. They were provided an opportunity to ask questions and all were answered. They agreed with the plan and demonstrated an understanding of the instructions.   They were advised to call back or seek an in-person evaluation if the symptoms worsen or if the condition fails to improve as anticipated.  Valetta Close Alayha Babineaux  Depression screen [facility] 2/9 02/02/2020 12/08/2019 09/01/2018  Decreased Interest 1 0 2  Down, Depressed, Hopeless 0 0 0  PHQ - 2 Score 1 0 2  Altered sleeping 1 1 3   Tired, decreased energy 1 2  1   Change in appetite 0 1  1  Feeling bad or failure about yourself  0 0 0  Trouble concentrating 0 1 0  Moving slowly or fidgety/restless 0 0 0  Suicidal thoughts 0 0 0  PHQ-9 Score 3 5 7    GAD 7 : Generalized Anxiety Score 02/02/2020 12/08/2019 09/01/2018  Nervous, Anxious, on Edge 0 1 1  Control/stop worrying 0 1 0  Worry too much - different things 0 1 0  Trouble relaxing 0 3 0  Restless 0 3 1  Easily annoyed or irritable 0 3 1  Afraid - awful might happen 0 1 0  Total GAD 7 Score 0 13 3

## 2020-02-16 ENCOUNTER — Other Ambulatory Visit: Payer: Self-pay

## 2020-02-16 ENCOUNTER — Ambulatory Visit: Payer: Medicaid Other | Admitting: *Deleted

## 2020-02-16 ENCOUNTER — Ambulatory Visit (HOSPITAL_COMMUNITY): Payer: Medicaid Other | Attending: Obstetrics and Gynecology

## 2020-02-16 ENCOUNTER — Other Ambulatory Visit: Payer: Self-pay | Admitting: *Deleted

## 2020-02-16 ENCOUNTER — Encounter: Payer: Self-pay | Admitting: *Deleted

## 2020-02-16 DIAGNOSIS — O0990 Supervision of high risk pregnancy, unspecified, unspecified trimester: Secondary | ICD-10-CM | POA: Diagnosis not present

## 2020-02-16 DIAGNOSIS — F419 Anxiety disorder, unspecified: Secondary | ICD-10-CM | POA: Insufficient documentation

## 2020-02-16 DIAGNOSIS — R339 Retention of urine, unspecified: Secondary | ICD-10-CM | POA: Diagnosis present

## 2020-02-16 DIAGNOSIS — E669 Obesity, unspecified: Secondary | ICD-10-CM | POA: Diagnosis not present

## 2020-02-16 DIAGNOSIS — Z8781 Personal history of (healed) traumatic fracture: Secondary | ICD-10-CM | POA: Diagnosis present

## 2020-02-16 DIAGNOSIS — Z363 Encounter for antenatal screening for malformations: Secondary | ICD-10-CM | POA: Diagnosis not present

## 2020-02-16 DIAGNOSIS — O99891 Other specified diseases and conditions complicating pregnancy: Secondary | ICD-10-CM

## 2020-02-16 DIAGNOSIS — F4310 Post-traumatic stress disorder, unspecified: Secondary | ICD-10-CM | POA: Insufficient documentation

## 2020-02-16 DIAGNOSIS — Z3A18 18 weeks gestation of pregnancy: Secondary | ICD-10-CM

## 2020-02-16 DIAGNOSIS — J45909 Unspecified asthma, uncomplicated: Secondary | ICD-10-CM | POA: Diagnosis present

## 2020-02-16 DIAGNOSIS — O99212 Obesity complicating pregnancy, second trimester: Secondary | ICD-10-CM | POA: Diagnosis not present

## 2020-02-16 DIAGNOSIS — Z87898 Personal history of other specified conditions: Secondary | ICD-10-CM | POA: Diagnosis present

## 2020-02-16 DIAGNOSIS — F1291 Cannabis use, unspecified, in remission: Secondary | ICD-10-CM

## 2020-02-16 DIAGNOSIS — O99210 Obesity complicating pregnancy, unspecified trimester: Secondary | ICD-10-CM

## 2020-02-22 ENCOUNTER — Ambulatory Visit (INDEPENDENT_AMBULATORY_CARE_PROVIDER_SITE_OTHER): Payer: Medicaid Other | Admitting: Clinical

## 2020-02-22 DIAGNOSIS — F4322 Adjustment disorder with anxiety: Secondary | ICD-10-CM

## 2020-02-29 ENCOUNTER — Encounter: Payer: Medicaid Other | Admitting: Obstetrics and Gynecology

## 2020-03-07 ENCOUNTER — Telehealth (INDEPENDENT_AMBULATORY_CARE_PROVIDER_SITE_OTHER): Payer: Medicaid Other | Admitting: Obstetrics and Gynecology

## 2020-03-07 ENCOUNTER — Other Ambulatory Visit: Payer: Self-pay

## 2020-03-07 VITALS — BP 121/80

## 2020-03-07 DIAGNOSIS — O0992 Supervision of high risk pregnancy, unspecified, second trimester: Secondary | ICD-10-CM | POA: Diagnosis not present

## 2020-03-07 DIAGNOSIS — O0990 Supervision of high risk pregnancy, unspecified, unspecified trimester: Secondary | ICD-10-CM

## 2020-03-07 DIAGNOSIS — O99340 Other mental disorders complicating pregnancy, unspecified trimester: Secondary | ICD-10-CM

## 2020-03-07 DIAGNOSIS — J45909 Unspecified asthma, uncomplicated: Secondary | ICD-10-CM | POA: Diagnosis not present

## 2020-03-07 DIAGNOSIS — O99512 Diseases of the respiratory system complicating pregnancy, second trimester: Secondary | ICD-10-CM

## 2020-03-07 DIAGNOSIS — Z3A21 21 weeks gestation of pregnancy: Secondary | ICD-10-CM

## 2020-03-07 DIAGNOSIS — E669 Obesity, unspecified: Secondary | ICD-10-CM

## 2020-03-07 DIAGNOSIS — E039 Hypothyroidism, unspecified: Secondary | ICD-10-CM

## 2020-03-07 DIAGNOSIS — O99282 Endocrine, nutritional and metabolic diseases complicating pregnancy, second trimester: Secondary | ICD-10-CM

## 2020-03-07 MED ORDER — ALBUTEROL SULFATE HFA 108 (90 BASE) MCG/ACT IN AERS: 1.0000 | INHALATION_SPRAY | Freq: Four times a day (QID) | RESPIRATORY_TRACT | 1 refills | Status: AC | PRN

## 2020-03-08 NOTE — Progress Notes (Signed)
   TELEHEALTH VIRTUAL OBSTETRICS VISIT ENCOUNTER NOTE  Clinic: Center for [facility]  I connected with [name] on 03/07/20 at 10:15 AM EDT by telephone at home and verified that I am speaking with the correct person using two identifiers.   I discussed the limitations, risks, security and privacy concerns of performing an evaluation and management service by telephone and the availability of in person appointments. I also discussed with the patient that there may be a patient responsible charge related to this service. The patient expressed understanding and agreed to proceed.  Subjective:  Ashlee Simmons is a 24 y.o. G3P2002 at [redacted]w[redacted]d being followed for ongoing prenatal care.  She is currently monitored for the following issues for this high-risk pregnancy and has Anxiety attack; Supervision of high risk pregnancy, antepartum; PTSD (post-traumatic stress disorder); Anxiety; Depression; Asthma; Obesity (BMI 30-39.9); History of femur fracture; History of marijuana use; Hypothyroidism; and Urinary retention with incomplete bladder emptying on their problem list.  Patient reports no complaints. Reports fetal movement. Denies any contractions, bleeding or leaking of fluid.   The following portions of the patient's history were reviewed and updated as appropriate: allergies, current medications, past family history, past medical history, past social history, past surgical history and problem list.   Objective:   Vitals:   03/07/20 1030  BP: 121/80    Babyscripts Data Reviewed: not applicable  General:  Alert, oriented and cooperative.   Mental Status: Normal mood and affect perceived. Normal judgment and thought content.  Rest of physical exam deferred due to type of encounter  Assessment and Plan:  Pregnancy: G3P2002 at [redacted]w[redacted]d 1. Supervision of high risk pregnancy, antepartum Routine care.  D/w pt re: birth control more nv F/u rpt growth for BMI   2.  Asthma Uses albuterol about once a week. Refill sent in  3. Hypothyroidism Not on any meds. Normal labs at HD. Recheck TSH with 28wk labs  Preterm labor symptoms and general obstetric precautions including but not limited to vaginal bleeding, contractions, leaking of fluid and fetal movement were reviewed in detail with the patient.  I discussed the assessment and treatment plan with the patient. The patient was provided an opportunity to ask questions and all were answered. The patient agreed with the plan and demonstrated an understanding of the instructions. The patient was advised to call back or seek an in-person office evaluation/go to MAU at [facility] for any urgent or concerning symptoms. Please refer to After Visit Summary for other counseling recommendations.   I provided 10 minutes of non-face-to-face time during this encounter. The visit was conducted via MyChart-medicine  Return in about 3 weeks (around 03/28/2020) for low risk, in person, virtual visit.  Future Appointments  Date Time Provider Department Center  03/15/2020 10:00 AM [facility] NURSE [facility]  03/15/2020 10:00 AM [facility] US1 [facility]  03/21/2020  3:45 PM [facility] CLINICIAN [facility]  03/28/2020 10:15 AM Adam Phenix, MD [facility]    Thorsby Bing, MD Center for [facility], [facility]

## 2020-03-12 ENCOUNTER — Ambulatory Visit: Payer: Self-pay | Admitting: *Deleted

## 2020-03-12 ENCOUNTER — Inpatient Hospital Stay (HOSPITAL_COMMUNITY)
Admission: AD | Admit: 2020-03-12 | Discharge: 2020-03-12 | Disposition: A | Discharge status: Home / Self Care | Payer: Medicaid Other | Attending: Obstetrics & Gynecology | Admitting: Obstetrics & Gynecology

## 2020-03-12 ENCOUNTER — Encounter (HOSPITAL_COMMUNITY): Payer: Self-pay | Admitting: Obstetrics & Gynecology

## 2020-03-12 ENCOUNTER — Other Ambulatory Visit: Payer: Self-pay

## 2020-03-12 DIAGNOSIS — R0789 Other chest pain: Secondary | ICD-10-CM | POA: Insufficient documentation

## 2020-03-12 DIAGNOSIS — O26892 Other specified pregnancy related conditions, second trimester: Secondary | ICD-10-CM | POA: Diagnosis present

## 2020-03-12 DIAGNOSIS — F419 Anxiety disorder, unspecified: Secondary | ICD-10-CM

## 2020-03-12 DIAGNOSIS — O99891 Other specified diseases and conditions complicating pregnancy: Secondary | ICD-10-CM

## 2020-03-12 DIAGNOSIS — O99512 Diseases of the respiratory system complicating pregnancy, second trimester: Secondary | ICD-10-CM | POA: Diagnosis not present

## 2020-03-12 DIAGNOSIS — F32A Depression, unspecified: Secondary | ICD-10-CM

## 2020-03-12 DIAGNOSIS — F4310 Post-traumatic stress disorder, unspecified: Secondary | ICD-10-CM

## 2020-03-12 DIAGNOSIS — Z8781 Personal history of (healed) traumatic fracture: Secondary | ICD-10-CM

## 2020-03-12 DIAGNOSIS — Z3A22 22 weeks gestation of pregnancy: Secondary | ICD-10-CM | POA: Insufficient documentation

## 2020-03-12 DIAGNOSIS — R0781 Pleurodynia: Secondary | ICD-10-CM | POA: Diagnosis not present

## 2020-03-12 DIAGNOSIS — O0990 Supervision of high risk pregnancy, unspecified, unspecified trimester: Secondary | ICD-10-CM

## 2020-03-12 DIAGNOSIS — R339 Retention of urine, unspecified: Secondary | ICD-10-CM

## 2020-03-12 DIAGNOSIS — J45990 Exercise induced bronchospasm: Secondary | ICD-10-CM | POA: Diagnosis not present

## 2020-03-12 DIAGNOSIS — E039 Hypothyroidism, unspecified: Secondary | ICD-10-CM

## 2020-03-12 DIAGNOSIS — F1291 Cannabis use, unspecified, in remission: Secondary | ICD-10-CM

## 2020-03-12 LAB — URINALYSIS, ROUTINE W REFLEX MICROSCOPIC
Bilirubin Urine: NEGATIVE
Glucose, UA: NEGATIVE mg/dL
Hgb urine dipstick: NEGATIVE
Ketones, ur: NEGATIVE mg/dL
Nitrite: NEGATIVE
Protein, ur: NEGATIVE mg/dL
Specific Gravity, Urine: 1.021 (ref 1.005–1.030)
pH: 6 (ref 5.0–8.0)

## 2020-03-12 MED ORDER — CYCLOBENZAPRINE HCL 5 MG PO TABS
10.0000 mg | ORAL_TABLET | Freq: Once | ORAL | Status: AC
  Administered 2020-03-12: 10 mg via ORAL
  Filled 2020-03-12: qty 2

## 2020-03-12 MED ORDER — CYCLOBENZAPRINE HCL 10 MG PO TABS: 10.0000 mg | ORAL_TABLET | Freq: Two times a day (BID) | ORAL | 0 refills | Status: DC | PRN

## 2020-03-12 NOTE — MAU Note (Signed)
Presents with c/o pain on the top of bilateral rib cage, left is worse than right.  Denies VB or LOF.  Endorses +FM.

## 2020-03-12 NOTE — MAU Provider Note (Signed)
History     CSN: [id number]  Arrival date and time: 03/12/20 1827   First Provider Initiated Contact with Patient 03/12/20 2032      Chief Complaint  Patient presents with   Rib Pain   [name] is a 24 y.o. year old G3P2002 female at 39w2dweeks gestation who presents to MAU reporting pain at the bottom of her ribs on both sides, but the left is worse than the right.  She also reports that she has tried to take ibuprofen, use a heating pad, take a warm bath, and eat hot and cold foods without any relief.  She has a history of exercise-induced asthma.  She denies any vaginal bleeding or loss of fluid or abnormal vaginal discharge or any lower abdominal pain.  She reports good positive fetal movement.  She states that she knows that every pregnancy is different, but she has never felt like this in any other pregnancy.   OB History    Gravida  3   Para  2   Term  2   Preterm      AB      Living  2     SAB      TAB      Ectopic      Multiple  0   Live Births  2           Past Medical History:  Diagnosis Date   Anxiety    Asthma    Depression    PTSD (post-traumatic stress disorder)     Past Surgical History:  Procedure Laterality Date   FEMUR SURGERY     plate in pelvis, rods in each femur   HIP SURGERY     MOUTH SURGERY      Family History  Problem Relation Age of Onset   COPD Maternal Grandfather    Thyroid disease Mother    Thyroid disease Maternal Grandmother    Alcohol abuse Neg Hx    Arthritis Neg Hx    Asthma Neg Hx    Birth defects Neg Hx    Cancer Neg Hx    Depression Neg Hx    Diabetes Neg Hx    Drug abuse Neg Hx    Early death Neg Hx    Hearing loss Neg Hx    Heart disease Neg Hx    Hyperlipidemia Neg Hx    Hypertension Neg Hx    Kidney disease Neg Hx    Learning disabilities Neg Hx    Mental illness Neg Hx    Mental retardation Neg Hx    Miscarriages / Stillbirths Neg Hx    Stroke  Neg Hx    Vision loss Neg Hx    Varicose Veins Neg Hx    Intellectual disability Neg Hx    Obesity Neg Hx    ADD / ADHD Neg Hx    Anxiety disorder Neg Hx     Social History   Tobacco Use   Smoking status: Never Smoker   Smokeless tobacco: Former UCounsellorUse: Never used  Substance Use Topics   Alcohol use: Not Currently    Comment: occ   Drug use: Not Currently    Types: Marijuana    Comment: none with pregnancy    Allergies: No Known Allergies  Medications Prior to Admission  Medication Sig Dispense Refill Last Dose   Blood Pressure Monitoring (BLOOD PRESSURE KIT) DEVI 1 Device by Does not apply  route as needed. 1 each 0 03/12/2020 at Unknown time   Prenatal Vit-Fe Fumarate-FA (PRENATAL VITAMINS PO) Take 1 tablet by mouth.   03/12/2020 at Unknown time   acetaminophen (TYLENOL) 325 MG tablet Take 650 mg by mouth every 6 (six) hours as needed.   More than a month at Unknown time   albuterol (VENTOLIN HFA) 108 (90 Base) MCG/ACT inhaler Inhale 1-2 puffs into the lungs every 6 (six) hours as needed for wheezing or shortness of breath. 6.7 g 1 Unknown at Unknown time   aspirin EC 81 MG tablet Take 1 tablet (81 mg total) by mouth daily. (Patient not taking: Reported on 03/07/2020) 200 tablet 1    SF 5000 PLUS 1.1 % CREA dental cream See admin instructions.       Review of Systems  Constitutional: Negative.   HENT: Negative.   Eyes: Negative.   Respiratory: Negative.   Cardiovascular: Negative.   Gastrointestinal: Negative.   Endocrine: Negative.   Genitourinary: Negative.   Musculoskeletal: Positive for myalgias (rib pain on both sides at the bottom).  Skin: Negative.   Allergic/Immunologic: Negative.   Hematological: Negative.   Psychiatric/Behavioral: Negative.    Physical Exam   Blood pressure 110/69, pulse 95, temperature 98.6 F (37 C), temperature source Oral, resp. rate 18, height '5\' 2"'$  (1.575 m), weight 83.9 kg, last menstrual  period 10/08/2019, SpO2 100 %, unknown if currently breastfeeding.  Physical Exam  Nursing note and vitals reviewed. Constitutional: She is oriented to person, place, and time.  HENT:  Head: Normocephalic and atraumatic.  Eyes: Pupils are equal, round, and reactive to light.  Cardiovascular: Normal rate and normal pulses.  Respiratory: Effort normal.  GI: Soft. Normal appearance.  Genitourinary:    Genitourinary Comments: deferred   Musculoskeletal:        General: Normal range of motion.     Cervical back: Normal range of motion.  Neurological: She is alert and oriented to person, place, and time.  Skin: Skin is warm.  Psychiatric: Her behavior is normal. Mood, judgment and thought content normal.   FHTs by doppler: 137 bpm  MAU Course  Procedures  MDM CCUA UCx -- Results pending  Flexeril 10 mg po -- did not resolve pain, "still sore"  Results for orders placed or performed during the hospital encounter of 03/12/20 (from the past 24 hour(s))  Urinalysis, Routine w reflex microscopic     Status: Abnormal   Collection Time: 03/12/20  7:44 PM  Result Value Ref Range   Color, Urine YELLOW YELLOW   APPearance CLOUDY (A) CLEAR   Specific Gravity, Urine 1.021 1.005 - 1.030   pH 6.0 5.0 - 8.0   Glucose, UA NEGATIVE NEGATIVE mg/dL   Hgb urine dipstick NEGATIVE NEGATIVE   Bilirubin Urine NEGATIVE NEGATIVE   Ketones, ur NEGATIVE NEGATIVE mg/dL   Protein, ur NEGATIVE NEGATIVE mg/dL   Nitrite NEGATIVE NEGATIVE   Leukocytes,Ua LARGE (A) NEGATIVE   RBC / HPF 0-5 0 - 5 RBC/hpf   WBC, UA 6-10 0 - 5 WBC/hpf   Bacteria, UA MANY (A) NONE SEEN   Squamous Epithelial / LPF 21-50 0 - 5   Mucus PRESENT    Amorphous Crystal PRESENT     Assessment and Plan  Rib pain - Information provided on chest wall pain - Encouraged to stop taking Ibuprofen for pain, unless instructed to do so by OB office.  - Discussed some rib pain can be a normal variation in pregnancy as abdomen enlarges and  chest is unable to fully expand like before - Discharge patient - Keep scheduled appt with [facility] on Thursday 03/15/20 - Patient verbalized an understanding of the plan of care and agrees.    Laury Deep, MSN, CNM 03/12/2020, 8:32 PM

## 2020-03-12 NOTE — Discharge Instructions (Signed)
PLEASE DO NOT TAKE IBUPROFEN FOR PAIN, UNLESS INSTRUCTED TO DO SO BY DOCTOR'S OFFICE.

## 2020-03-12 NOTE — Telephone Encounter (Signed)
Patient called and c/o left upper rib pain. Patient reports rib pain progressively worse since 4-5 hours. Pain noted when breathing deep. Patient reports she has taken tylenol with out relief. Patient reports she is 22 weeks and 2 days pregnant. Noted spotting in underwear days ago but nothing significant.paitient denies spotting now. Patient reports left upper ribs are sore,  firm to  Touch.  Last Dr visit 2 weeks ago.patient stated she attempted to call her care provider but no available appt. Care advise given and patient verbalized understanding. Patient instructed to go to ED for new onset or worsening  of symptoms.    Reason for Disposition  Taking a deep breath makes pain worse  Answer Assessment - Initial Assessment Questions 1. LOCATION: "Where does it hurt?"      Left rib pain 2. RADIATION: "Does the pain shoot anywhere else?" (e.g., chest, back)     Left ribs sore with radiating towards your back  3. ONSET: "When did the pain begin?" (e.g., minutes, hours or days ago)      4-5 hours  4. SUDDEN: "Gradual or sudden onset?"     Gradually and turned to sudden 5. PATTERN "Does the pain come and go, or is it constant?"    - If constant: "Is it getting better, staying the same, or worsening?"      (Note: Constant means the pain never goes away completely; most serious pain is constant and it progresses)     - If intermittent: "How long does it last?" "Do you have pain now?"     (Note: Intermittent means the pain goes away completely between bouts)     Constant can not get relief with changing position pain is now  6. SEVERITY: "How bad is the pain?"  (e.g., Scale 1-10; mild, moderate, or severe)   - MILD (1-3): doesn't interfere with normal activities, abdomen soft and not tender to touch    - MODERATE (4-7): interferes with normal activities or awakens from sleep, tender to touch    - SEVERE (8-10): excruciating pain, doubled over, unable to do any normal activities      Moderate   7. RECURRENT SYMPTOM: "Have you ever had this type of stomach pain before?" If Yes, ask: "When was the last time?" and "What happened that time?"      With another pregnacy but this one is worse 8. CAUSE: "What do you think is causing the stomach pain?"     Un sure  9. RELIEVING/AGGRAVATING FACTORS: "What makes it better or worse?" (e.g., movement, antacids, bowel movement)      Hurts with sitting cannot find any comfortable position and back pain 10. OTHER SYMPTOMS: "Has there been any vomiting, diarrhea, constipation, or urine problems?"       Vomiting and lightheadnessx 2 days  11. PREGNANCY: "Is there any chance you are pregnant?" "When was your last menstrual period?"       22 weeks  Answer Assessment - Initial Assessment Questions 1. LOCATION: "Where does it hurt?"       Left ribs upper portion 2. RADIATION: "Does the pain go anywhere else?" (e.g., into neck, jaw, arms, back)     Radiates to back 3. ONSET: "When did the chest pain begin?" (Minutes, hours or days)      Unable to give specific time but reports pain increasiing over past 4-5 hours 4. PATTERN "Does the pain come and go, or has it been constant since it started?"  "Does it get worse with  exertion?"      Started as coming and going and now constant pain, gets better with standing but can not find a comfortable position 5. DURATION: "How long does it last" (e.g., seconds, minutes, hours)     Constant at this time 6. SEVERITY: "How bad is the pain?"  (e.g., Scale 1-10; mild, moderate, or severe)    - MILD (1-3): doesn't interfere with normal activities     - MODERATE (4-7): interferes with normal activities or awakens from sleep    - SEVERE (8-10): excruciating pain, unable to do any normal activities       moderate 7. CARDIAC RISK FACTORS: "Do you have any history of heart problems or risk factors for heart disease?" (e.g., angina, prior heart attack; diabetes, high blood pressure, high cholesterol, smoker, or strong  family history of heart disease)     None reported 8. PULMONARY RISK FACTORS: "Do you have any history of lung disease?"  (e.g., blood clots in lung, asthma, emphysema, birth control pills)     Patient is [redacted] weeks pregnant 9. CAUSE: "What do you think is causing the chest pain?"     Unsure  10. OTHER SYMPTOMS: "Do you have any other symptoms?" (e.g., dizziness, nausea, vomiting, sweating, fever, difficulty breathing, cough)       No .  Patient Did report pain in left ribs with breathing , reported episode of vomiting and lighheadness few days ago and now resolved  11. PREGNANCY: "Is there any chance you are pregnant?" "When was your last menstrual period?"       [redacted] weeks pregnant 2 days  Protocols used: CHEST PAIN-A-AH, ABDOMINAL PAIN - [facility]

## 2020-03-13 NOTE — BH Specialist Note (Signed)
Integrated Behavioral Health via Telemedicine Phone [facility]) Visit  03/13/2020 Ashlee Simmons [id number]  Number of Integrated Behavioral Health visits: 5 Session Start time: 3:45  Session End time: 4:00 Total time: 15  Referring Provider: Venia Carbon, NP Type of Visit: Phone Patient/Family location: Home Ashlee Simmons Provider location: [facility]  All persons participating in visit: Patient Ashlee Simmons and Ashlee Simmons Ashlee Simmons    Confirmed patient's address: Yes  Confirmed patient's phone number: Yes  Any changes to demographics: No   Confirmed patient's insurance: Yes  Any changes to patient's insurance: No   Discussed confidentiality: at previous visit  I connected with Ashlee Simmons  by a video enabled telemedicine application ([employer]) and verified that I am speaking with the correct person using two identifiers.     I discussed the limitations of evaluation and management by telemedicine and the availability of in person appointments.  I discussed that the purpose of this visit is to provide behavioral health care while limiting exposure to the novel coronavirus.   Discussed there is a possibility of technology failure and discussed alternative modes of communication if that failure occurs.  I discussed that engaging in this virtual visit, they consent to the provision of behavioral healthcare and the services will be billed under their insurance.  Patient and/or legal guardian expressed understanding and consented to virtual visit: Yes   PRESENTING CONCERNS: Patient and/or family reports the following symptoms/concerns: Pt states her primary concern today is work stress; helps to talk through feelings today.  Duration of problem: Ongoing; Severity of problem: mild  STRENGTHS (Protective Factors/Coping Skills): Good family support; utilizing daily self-coping strategies  GOALS ADDRESSED: Patient  will: 1.  Reduce symptoms of: agitation, anxiety and stress  2.  Increase knowledge and/or ability of: healthy habits  3.  Demonstrate ability to: Increase healthy adjustment to current life circumstances  INTERVENTIONS: Interventions utilized:  Supportive Counseling Standardized Assessments completed: GAD-7 and PHQ 9  ASSESSMENT: Patient currently experiencing Adjustment disorder with anxiety and History of PTSD.   Patient may benefit from continued psychoeducation and brief therapeutic interventions regarding maintaining reduction of symptoms of anxiety, depression, and life stress .  PLAN: 1. Follow up with behavioral health clinician on : One month 2. Behavioral recommendations:  -Continue plan to attend initial psychiatry appointment on 04/26/20 -Continue using daily self-coping strategies that are helping to cope with symptoms(sleep as priority, outdoor activities with family, "nesting", and time with pets) -Continue taking prenatal vitamin daily -Continue interview process/job-search for a less-stressful position 3. Referral(s): Integrated [employer] (In Clinic)  I discussed the assessment and treatment plan with the patient and/or parent/guardian. They were provided an opportunity to ask questions and all were answered. They agreed with the plan and demonstrated an understanding of the instructions.   They were advised to call back or seek an in-person evaluation if the symptoms worsen or if the condition fails to improve as anticipated.  Ashlee Simmons  Depression screen [facility] 2/9 03/07/2020 02/02/2020 12/08/2019 09/01/2018  Decreased Interest 2 1 0 2  Down, Depressed, Hopeless 0 0 0 0  PHQ - 2 Score 2 1 0 2  Altered sleeping 0 1 1 3   Tired, decreased energy 1 1 2 1   Change in appetite 0 0 1 1  Feeling bad or failure about yourself  0 0 0 0  Trouble concentrating 0 0 1 0  Moving slowly or fidgety/restless 0 0 0 0  Suicidal  thoughts 0 0 0 0  PHQ-9 Score 3  3 5 7    GAD 7 : Generalized Anxiety Score 03/07/2020 02/02/2020 12/08/2019 09/01/2018  Nervous, Anxious, on Edge 1 0 1 1  Control/stop worrying 0 0 1 0  Worry too much - different things 0 0 1 0  Trouble relaxing 0 0 3 0  Restless 0 0 3 1  Easily annoyed or irritable 1 0 3 1  Afraid - awful might happen 0 0 1 0  Total GAD 7 Score 2 0 13 3

## 2020-03-14 LAB — CULTURE, OB URINE

## 2020-03-15 ENCOUNTER — Ambulatory Visit: Payer: Medicaid Other | Attending: Obstetrics and Gynecology

## 2020-03-15 ENCOUNTER — Ambulatory Visit: Payer: Medicaid Other | Admitting: *Deleted

## 2020-03-15 ENCOUNTER — Other Ambulatory Visit: Payer: Self-pay

## 2020-03-15 DIAGNOSIS — Z87898 Personal history of other specified conditions: Secondary | ICD-10-CM | POA: Insufficient documentation

## 2020-03-15 DIAGNOSIS — R339 Retention of urine, unspecified: Secondary | ICD-10-CM

## 2020-03-15 DIAGNOSIS — O99210 Obesity complicating pregnancy, unspecified trimester: Secondary | ICD-10-CM | POA: Diagnosis present

## 2020-03-15 DIAGNOSIS — Z362 Encounter for other antenatal screening follow-up: Secondary | ICD-10-CM

## 2020-03-15 DIAGNOSIS — Z8781 Personal history of (healed) traumatic fracture: Secondary | ICD-10-CM | POA: Insufficient documentation

## 2020-03-15 DIAGNOSIS — O0990 Supervision of high risk pregnancy, unspecified, unspecified trimester: Secondary | ICD-10-CM | POA: Insufficient documentation

## 2020-03-15 DIAGNOSIS — E669 Obesity, unspecified: Secondary | ICD-10-CM

## 2020-03-15 DIAGNOSIS — F4310 Post-traumatic stress disorder, unspecified: Secondary | ICD-10-CM | POA: Diagnosis present

## 2020-03-15 DIAGNOSIS — Z3A22 22 weeks gestation of pregnancy: Secondary | ICD-10-CM | POA: Diagnosis not present

## 2020-03-15 DIAGNOSIS — F419 Anxiety disorder, unspecified: Secondary | ICD-10-CM

## 2020-03-15 DIAGNOSIS — F1291 Cannabis use, unspecified, in remission: Secondary | ICD-10-CM

## 2020-03-15 DIAGNOSIS — O99212 Obesity complicating pregnancy, second trimester: Secondary | ICD-10-CM

## 2020-03-21 ENCOUNTER — Ambulatory Visit (INDEPENDENT_AMBULATORY_CARE_PROVIDER_SITE_OTHER): Payer: Medicaid Other | Admitting: Clinical

## 2020-03-21 DIAGNOSIS — F4310 Post-traumatic stress disorder, unspecified: Secondary | ICD-10-CM

## 2020-03-21 DIAGNOSIS — F4322 Adjustment disorder with anxiety: Secondary | ICD-10-CM | POA: Diagnosis not present

## 2020-03-22 ENCOUNTER — Telehealth: Payer: Self-pay | Admitting: Clinical

## 2020-03-22 NOTE — Telephone Encounter (Signed)
Follow up with pt after visit yesterday, 03/21/20; Apologies to pt, as phone lines in building cut off after power outage during her virtual visit; Pt has no other questions or concerns at this time and is understanding.

## 2020-03-22 NOTE — Patient Instructions (Signed)
[facility] [address] [place], [place] [address] [phone] (main office) [phone] (Laval Cafaro's office)   

## 2020-03-28 ENCOUNTER — Encounter: Payer: Medicaid Other | Admitting: Obstetrics & Gynecology

## 2020-04-04 ENCOUNTER — Encounter: Payer: Self-pay | Admitting: Obstetrics and Gynecology

## 2020-04-04 ENCOUNTER — Encounter: Payer: Medicaid Other | Admitting: Obstetrics and Gynecology

## 2020-04-18 ENCOUNTER — Ambulatory Visit (INDEPENDENT_AMBULATORY_CARE_PROVIDER_SITE_OTHER): Payer: Medicaid Other | Admitting: Family Medicine

## 2020-04-18 ENCOUNTER — Other Ambulatory Visit: Payer: Self-pay

## 2020-04-18 VITALS — BP 104/67 | HR 102

## 2020-04-18 DIAGNOSIS — O99342 Other mental disorders complicating pregnancy, second trimester: Secondary | ICD-10-CM

## 2020-04-18 DIAGNOSIS — O99210 Obesity complicating pregnancy, unspecified trimester: Secondary | ICD-10-CM

## 2020-04-18 DIAGNOSIS — O99212 Obesity complicating pregnancy, second trimester: Secondary | ICD-10-CM

## 2020-04-18 DIAGNOSIS — J45909 Unspecified asthma, uncomplicated: Secondary | ICD-10-CM

## 2020-04-18 DIAGNOSIS — O0990 Supervision of high risk pregnancy, unspecified, unspecified trimester: Secondary | ICD-10-CM

## 2020-04-18 DIAGNOSIS — O99512 Diseases of the respiratory system complicating pregnancy, second trimester: Secondary | ICD-10-CM

## 2020-04-18 DIAGNOSIS — Z23 Encounter for immunization: Secondary | ICD-10-CM

## 2020-04-18 DIAGNOSIS — Z3009 Encounter for other general counseling and advice on contraception: Secondary | ICD-10-CM

## 2020-04-18 DIAGNOSIS — F32A Depression, unspecified: Secondary | ICD-10-CM

## 2020-04-18 DIAGNOSIS — F329 Major depressive disorder, single episode, unspecified: Secondary | ICD-10-CM

## 2020-04-18 MED ORDER — TETANUS-DIPHTH-ACELL PERTUSSIS 5-2.5-18.5 LF-MCG/0.5 IM SUSP: 0.5000 mL | Freq: Once | INTRAMUSCULAR | Status: DC

## 2020-04-18 NOTE — Progress Notes (Signed)
   PRENATAL VISIT NOTE  Subjective:  Ashlee Simmons is a 24 y.o. G3P2002 at [redacted]w[redacted]d being seen today for ongoing prenatal care.  She is currently monitored for the following issues for this low-risk pregnancy and has Anxiety attack; Supervision of high risk pregnancy, antepartum; PTSD (post-traumatic stress disorder); Anxiety; Depression; Asthma; Obesity (BMI 30-39.9); History of femur fracture; History of marijuana use; Hypothyroidism; and Urinary retention with incomplete bladder emptying on their problem list.  Patient reports continued bilateral rib pain but otherwise no complaints. Contractions: Not present.  .  Movement: Present. Denies leaking of fluid.   The following portions of the patient's history were reviewed and updated as appropriate: allergies, current medications, past family history, past medical history, past social history, past surgical history and problem list.   Objective:   Vitals:   04/18/20 1348  BP: 104/67  Pulse: 102    Fetal Status: Fetal Heart Rate (bpm): 150 Fundal Height: 30 cm Movement: Present     General:  Alert, oriented and cooperative. Patient is in no acute distress.  Skin: Skin is warm and dry. No rash noted.   Cardiovascular: Normal heart rate noted  Respiratory: Normal respiratory effort, no problems with respiration noted  Abdomen: Soft, gravid, appropriate for gestational age.  Pain/Pressure: Absent     Pelvic: Cervical exam deferred        Extremities: Normal range of motion.  Edema: Trace  Mental Status: Normal mood and affect. Normal behavior. Normal judgment and thought content.   Assessment and Plan:  Pregnancy: [id number] at [redacted]w[redacted]d [name] was seen today for routine prenatal visit.  Diagnoses and all orders for this visit:  Supervision of high risk pregnancy, antepartum -     RPR; Future -     CBC; Future -     Tdap (BOOSTRIX) injection 0.5 mL -     HIV antibody (with reflex); Future -     Thyroid Panel With TSH; Future -      Glucose Tolerance, 2 Hours w/1 Hour; Future - Patient missed last appt and did not show up fasting. Offered 1 hour GTT but patient prefers to return next week for all labs.   Asthma, unspecified asthma severity, unspecified whether complicated, unspecified whether persistent       - Well-controlled. Albuterol PRN  Depression, unspecified depression type       - Mood stable.   Obesity in pregnancy, antepartum       - Desires breast reduction post-partum; please refer post-partum  Unwanted fertility       - BTL papers signed today   Preterm labor symptoms and general obstetric precautions including but not limited to vaginal bleeding, contractions, leaking of fluid and fetal movement were reviewed in detail with the patient. Please refer to After Visit Summary for other counseling recommendations.   Return in about 4 weeks (around 05/16/2020) for [facility]: in-person or virtual per patient request .  Future Appointments  Date Time Provider Department Center  04/26/2020  9:30 AM [facility]  04/26/2020  3:45 PM [facility]  05/02/2020 11:00 AM Shanna Cisco, NP [facility] None  05/16/2020  1:55 PM Warden Fillers, MD [facility] [address] [facility]    Joselyn Arrow, MD

## 2020-04-18 NOTE — Patient Instructions (Signed)

## 2020-04-18 NOTE — Addendum Note (Signed)
Addended by: Marjo Bicker on: 04/18/2020 05:01 PM   Modules accepted: Orders

## 2020-04-19 ENCOUNTER — Encounter: Payer: Self-pay | Admitting: *Deleted

## 2020-04-19 NOTE — BH Specialist Note (Signed)
Integrated Behavioral Health via Telemedicine Video [facility]) Visit  04/19/2020 Ashlee Simmons [id number]  Number of Integrated Behavioral Health visits: 6 (1 less than 15 min) Session Start time: 3:50  Session End time: 4:27 Total time: 37  Referring Provider: Venia Carbon, NP Type of Visit: Video Patient/Family location: Home [facility] Provider location: [facility]  All persons participating in visit: Patient Ashlee Simmons and Ashlee Simmons    Confirmed patient's address: Yes  Confirmed patient's phone number: Yes  Any changes to demographics: No   Confirmed patient's insurance: Yes  Any changes to patient's insurance: No   Discussed confidentiality: At previous visit  I connected with Ashlee Simmons by a video enabled telemedicine application ([employer]) and verified that I am speaking with the correct person using two identifiers.     I discussed the limitations of evaluation and management by telemedicine and the availability of in person appointments.  I discussed that the purpose of this visit is to provide behavioral health care while limiting exposure to the novel coronavirus.   Discussed there is a possibility of technology failure and discussed alternative modes of communication if that failure occurs.  I discussed that engaging in this virtual visit, they consent to the provision of behavioral healthcare and the services will be billed under their insurance.  Patient and/or legal guardian expressed understanding and consented to virtual visit: Yes   PRESENTING CONCERNS: Patient and/or family reports the following symptoms/concerns: Pt states her primary concern today is worry that she is isolating herself too much from people, sleeping too much, yet tired all the time, while managing a household with two toddlers, pets, working full-time and going to school.  Duration of problem: Increase in  pregnancy; Severity of problem: moderate  STRENGTHS (Protective Factors/Coping Skills): Utilizes daily coping strategies; Supportive siblings   GOALS ADDRESSED: Patient will: 1.  Reduce symptoms of: anxiety and stress  2.  Increase knowledge and/or ability of: stress reduction  3.  Demonstrate ability to: Increase healthy adjustment to current life circumstances and Increase motivation to adhere to plan of care  INTERVENTIONS: Interventions utilized:  Solution-Focused Strategies Standardized Assessments completed: PHQ/GAD given in past two weeks  ASSESSMENT: Patient currently experiencing Adjustment disorder with anxiety .   Patient may benefit from continued psychoeducation and brief therapeutic interventions regarding coping with symptoms of anxiety and life stress .  PLAN: 1. Follow up with behavioral health clinician on : One month 2. Behavioral recommendations:  -Continue taking prenatal vitamin daily -Continue using daily self-coping strategies (including time with bunnies/guinea pig) -Plan a date night with partner in August -Consider scheduling a perinatal massage in August -Continue with plan to have a "self-care day" one day in the upcoming week -Be kind to yourself. Allow yourself healthy sleep nightly.  3. Referral(s): Integrated [employer] (In Clinic)  I discussed the assessment and treatment plan with the patient and/or parent/guardian. They were provided an opportunity to ask questions and all were answered. They agreed with the plan and demonstrated an understanding of the instructions.   They were advised to call back or seek an in-person evaluation if the symptoms worsen or if the condition fails to improve as anticipated.  Ashlee Simmons  Depression screen [facility] 2/9 04/19/2020 03/07/2020 02/02/2020 12/08/2019 09/01/2018  Decreased Interest 0 2 1 0 2  Down, Depressed, Hopeless 0 0 0 0 0  PHQ - 2 Score 0 2 1 0 2  Altered sleeping 1  0 1 1 3    Tired, decreased energy 1 1 1 2 1   Change in appetite 0 0 0 1 1  Feeling bad or failure about yourself  0 0 0 0 0  Trouble concentrating 0 0 0 1 0  Moving slowly or fidgety/restless 0 0 0 0 0  Suicidal thoughts 0 0 0 0 0  PHQ-9 Score 2 3 3 5 7    GAD 7 : Generalized Anxiety Score 04/19/2020 03/07/2020 02/02/2020 12/08/2019  Nervous, Anxious, on Edge 0 1 0 1  Control/stop worrying 0 0 0 1  Worry too much - different things 0 0 0 1  Trouble relaxing 0 0 0 3  Restless 1 0 0 3  Easily annoyed or irritable 1 1 0 3  Afraid - awful might happen 0 0 0 1  Total GAD 7 Score 2 2 0 13

## 2020-04-24 ENCOUNTER — Encounter (HOSPITAL_COMMUNITY): Payer: Self-pay | Admitting: Emergency Medicine

## 2020-04-24 ENCOUNTER — Emergency Department (HOSPITAL_COMMUNITY)
Admission: EM | Admit: 2020-04-24 | Discharge: 2020-04-24 | Disposition: A | Discharge status: Home / Self Care | Payer: Medicaid Other | Attending: Emergency Medicine | Admitting: Emergency Medicine

## 2020-04-24 DIAGNOSIS — Z20822 Contact with and (suspected) exposure to covid-19: Secondary | ICD-10-CM | POA: Diagnosis not present

## 2020-04-24 DIAGNOSIS — Z5321 Procedure and treatment not carried out due to patient leaving prior to being seen by health care provider: Secondary | ICD-10-CM | POA: Insufficient documentation

## 2020-04-24 NOTE — ED Triage Notes (Signed)
Patient reports she was exposed to someone with COVID, no S/S. States she will be leaving and follow up with her doctor in AM.

## 2020-04-26 ENCOUNTER — Other Ambulatory Visit: Payer: Medicaid Other

## 2020-04-26 ENCOUNTER — Ambulatory Visit (INDEPENDENT_AMBULATORY_CARE_PROVIDER_SITE_OTHER): Payer: Medicaid Other | Admitting: Clinical

## 2020-04-26 ENCOUNTER — Other Ambulatory Visit: Payer: Self-pay

## 2020-04-26 DIAGNOSIS — F4310 Post-traumatic stress disorder, unspecified: Secondary | ICD-10-CM | POA: Diagnosis not present

## 2020-04-26 DIAGNOSIS — F4322 Adjustment disorder with anxiety: Secondary | ICD-10-CM

## 2020-04-26 NOTE — Patient Instructions (Signed)
[facility] [place], [place] [address] [phone] (main office) [phone] ([place] office)     BRAINSTORMING  Develop a Plan Goals: . Provide a way to start conversation about your new life with a baby . Assist parents in recognizing and using resources within their reach . Help pave the way before birth for an easier period of transition afterwards.  Make a list of the following information to keep in a central location: . Full name of Mom and Partner: _____________________________________________ . 80 full name and Date of Birth: ___________________________________________ . Home Address: ___________________________________________________________ ________________________________________________________________________ . Home Phone: ____________________________________________________________ . Parents' cell numbers: _____________________________________________________ ________________________________________________________________________ . Name and contact info for OB: ______________________________________________ . Name and contact info for Pediatrician:________________________________________ . Contact info for Lactation Consultants: ________________________________________  REST and SLEEP *You each need at least 4-5 hours of uninterrupted sleep every day. Write specific names and contact information.* . How are you going to rest in the postpartum period? While partner's home? When partner returns to work? When you both return to work? Marland Kitchen Where will your baby sleep? Marland Kitchen Who is available to help during the day? Evening? Night? . Who could move in for a period to help support you? Marland Kitchen What are some ideas to help you get enough  sleep? __________________________________________________________________________________________________________________________________________________________________________________________________________________________________________ NUTRITIOUS FOOD AND DRINK *Plan for meals before your baby is born so you can have healthy food to eat during the immediate postpartum period.* . Who will look after breakfast? Lunch? Dinner? List names and contact information. Brainstorm quick, healthy ideas for each meal. . What can you do before baby is born to prepare meals for the postpartum period? . How can others help you with meals? Marland Kitchen Which grocery stores provide online shopping and delivery? Marland Kitchen Which restaurants offer take-out or delivery options? ______________________________________________________________________________________________________________________________________________________________________________________________________________________________________________________________________________________________________________________________________________________________________________________________________  CARE FOR MOM *It's important that mom is cared for and pampered in the postpartum period. Remember, the most important ways new mothers need care are: sleep, nutrition, gentle exercise, and time off.* . Who can come take care of mom during this period? Make a list of people with their contact information. . List some activities that make you feel cared for, rested, and energized? Who can make sure you have opportunities to do these things? . Does mom have a space of her very own within your home that's just for her? Make a "[facility]" where she can be comfortable, rest, and renew herself  daily. ______________________________________________________________________________________________________________________________________________________________________________________________________________________________________________________________________________________________________________________________________________________________________________________________________    CARE FOR AND FEEDING BABY *Knowledgeable and encouraging people will offer the best support with regard to feeding your baby.* . Educate yourself and choose the best feeding option for your baby. . Make a list of people who will guide, support, and be a resource for you as your care for and feed your baby. (Friends that have breastfed or are currently breastfeeding, lactation consultants, breastfeeding support groups, etc.) . Consider a postpartum doula. (These websites can give you information: dona.org & BuyingShow.es) . Seek out local breastfeeding resources like the breastfeeding support group at [employer] or [employer]. ______________________________________________________________________________________________________________________________________________________________________________________________________________________________________________________________________________________________________________________________________________________________________________________________________  Verner Chol AND ERRANDS . Who can help with a thorough cleaning before baby is born? . Make a list of people who will help with housekeeping and chores, like laundry, light cleaning, dishes, bathrooms, etc. . Who can run some errands for you? Marland Kitchen What can you do to make sure you are stocked with basic supplies before baby is born? . Who is going to do the  shopping? ______________________________________________________________________________________________________________________________________________________________________________________________________________________________________________________________________________________________________________________________________________________________________________________________________     Family Adjustment *Nurture yourselves.it helps parents be more loving and allows for better bonding with their child.* . What sorts of things do you and partner enjoy doing together? Which activities help you to connect and strengthen your relationship? Make a list of those things. Make a list of people whom you trust to care for your baby so you can have some time together as a couple. . What types of things help partner feel connected to Mom? Make a list. . What needs will partner have in order to bond with baby? . Other children? Who will care for them when you go into labor and while you are in the hospital? . Think about what the needs of your older children might be. Who can help you meet those needs? In what ways are you helping them prepare for bringing baby home? List some specific strategies you have for family adjustment. _______________________________________________________________________________________________________________________________________________________________________________________________________________________________________________________________________________________________________________________________________________  SUPPORT *Someone who can empathize with experiences normalizes your problems and makes them more bearable.* . Make a list of other friends, neighbors, and/or co-workers you know with infants (and small children, if applicable) with whom you can connect. . Make a list of local or online support groups, mom groups, etc. in which you can be  involved. ______________________________________________________________________________________________________________________________________________________________________________________________________________________________________________________________________________________________________________________________________________________________________________________________________  Childcare Plans . Investigate and plan for childcare if mom is returning to work. . Talk about mom's concerns about her transition back to work. . Talk about partner's concerns regarding this transition.  Mental Health *Your mental health is one of the highest priorities for a pregnant or postpartum mom.* . 1 in 5 women experience anxiety and/or depression from the time of conception through the first year after birth. . Postpartum Mood Disorders are the #1 complication of pregnancy and childbirth and the suffering experienced by these mothers is not necessary! These illnesses are temporary and respond well to treatment, which often includes self-care, social support, talk therapy, and medication when needed. . Women experiencing anxiety and depression often say things like: "I'm supposed to be happy.why do I feel so sad?", "Why can't I snap out of it?", "I'm having thoughts that scare me." . There is no need to be embarrassed if you are feeling these symptoms: o Overwhelmed, anxious, angry, sad, guilty, irritable, hopeless, exhausted but can't sleep o You are NOT alone. You are NOT to blame. With help, you WILL be well. . Where can I find help? Medical professionals such as your OB, midwife, gynecologist, family practitioner, primary care provider, pediatrician, or mental health providers; [facility] support groups: Feelings After Birth, Breastfeeding Support Group, Baby and Me Group, and Fit 4 Two exercise classes. . You have permission to ask for help. It will confirm your feelings, validate your  experiences, share/learn coping strategies, and gain support and encouragement as you heal. You are important! BRAINSTORM . Make a list of local resources, including resources for mom and for partner. . Identify support groups. . Identify people to call late at night - include names and contact info. . Talk with partner about perinatal mood and anxiety disorders. . Talk with your OB, midwife, and doula about baby blues and about perinatal mood and anxiety disorders. . Talk with your pediatrician about perinatal mood and anxiety disorders.   Support & Sanity Savers   What do you really need?  . Basics . In preparing for a new baby, many expectant parents spend hours shopping for baby clothes, decorating the nursery, and deciding which car seat to   buy. Yet most don't think much about what the reality of parenting a newborn will be like, and what they need to make it through that. So, here is the advice of experienced parents. We know you'll read this, and think "they're exaggerating, I don't really need that." Just trust [place] on these, OK? Plan for all of this, and if it turns out you don't need it, come back and teach [place] how you did it!  Satira Anis (Once baby's survival needs are met, make sure you attend to your own survival needs!) . Sleep . An average newborn sleeps 16-18 hours per day, over 6-7 sleep periods, rarely more than three hours at a time. It is normal and healthy for a newborn to wake throughout the night... but really hard on parents!! . Naps. Prioritize sleep above any responsibilities like: cleaning house, visiting friends, running errands, etc.  Sleep whenever baby sleeps. If you can't nap, at least have restful times when baby eats. The more rest you get, the more patient you will be, the more emotionally stable, and better at solving problems.  . Food . You may not have realized it would be difficult to eat when you have a newborn. Yet, when we talk to . countless new  parents, they say things like "it may be 2:00 pm when I realize I haven't had breakfast yet." Or "every time we sit down to dinner, baby needs to eat, and my food gets cold, so I don't bother to eat it." . Finger food. Before your baby is born, stock up with one months' worth of food that: 1) you can eat with one hand while holding a baby, 2) doesn't need to be prepped, 3) is good hot or cold, 4) doesn't spoil when left out for a few hours, and 5) you like to eat. Think about: nuts, dried fruit, Clif bars, pretzels, jerky, gogurt, baby carrots, apples, bananas, crackers, cheez-n-crackers, string cheese, hot pockets or frozen burritos to microwave, garden burgers and breakfast pastries to put in the toaster, yogurt drinks, etc. . Restaurant Menus. Make lists of your favorite restaurants & menu items. When family/friends want to help, you can give specific information without much thought. They can either bring you the food or send gift cards for just the right meals. Rosaura Carpenter Meals.  Take some time to make a few meals to put in the freezer ahead of time.  Easy to freeze meals can be anything such as soup, lasagna, chicken pie, or spaghetti sauce. . Set up a Meal Schedule.  Ask friends and family to sign up to bring you meals during the first few weeks of being home. (It can be passed around at baby showers!) You have no idea how helpful this will be until you are in the throes of parenting.  https://hamilton-woodard.com/ is a great website to check out. . Emotional Support . Know who to call when you're stressed out. Parenting a newborn is very challenging work. There are times when it totally overwhelms your normal coping abilities. EVERY NEW PARENT NEEDS TO HAVE A PLAN FOR WHO TO CALL WHEN THEY JUST CAN'T COPE ANY MORE. (And it has to be someone other than the baby's other parent!) Before your baby is born, come up with at least one person you can call for support - write their phone number down and post it on the  refrigerator. Marland Kitchen Anxiety & Sadness. Baby blues are normal after pregnancy; however, there are more severe types of anxiety &  sadness which can occur and should not be ignored.  They are always treatable, but you have to take the first step by reaching out for help. [facility] offers a "Mom Talk" group which meets every Tuesday from 10 am - 11 am.  This group is for new moms who need support and connection after their babies are born.  Call [phone].  Marland Kitchen Really, Really Helpful (Plan for them! Make sure these happen often!!) . Physical Support with Taking Care of Yourselves . Asking friends and family. Before your baby is born, set up a schedule of people who can come and visit and help out (or ask a friend to schedule for you). Any time someone says "let me know what I can do to help," sign them up for a day. When they get there, their job is not to take care of the baby (that's your job and your joy). Their job is to take care of you!  . Postpartum doulas. If you don't have anyone you can call on for support, look into postpartum doulas:  professionals at helping parents with caring for baby, caring for themselves, getting breastfeeding started, and helping with household tasks. www.padanc.org is a helpful website for learning about doulas in our area. . Peer Support / Parent Groups . Why: One of the greatest ideas for new parents is to be around other new parents. Parent groups give you a chance to share and listen to others who are going through the same season of life, get a sense of what is normal infant development by watching several babies learn and grow, share your stories of triumph and struggles with empathetic ears, and forgive your own mistakes when you realize all parents are learning by trial and error. . Where to find: There are many places you can meet other new parents throughout our community.  [facility] offers the following classes for new moms and their little ones:  Baby  and Me (Birth to [place]) and Breastfeeding Support Group. Go to www.conehealthybaby.com or call [phone] for more information. . Time for your Relationship . It's easy to get so caught up in meeting baby's immediate needs that it's hard to find time to connect with your partner, and meet the needs of your relationship. It's also easy to forget what "quality time with your partner" actually looks like. If you take your baby on a date, you'd be amazed how much of your couple time is spent feeding the baby, diapering the baby, admiring the baby, and talking about the baby. . Dating: Try to take time for just the two of you. Babysitter tip: Sometimes when moms are breastfeeding a newborn, they find it hard to figure out how to schedule outings around baby's unpredictable feeding schedules. Have the babysitter come for a three hour period. When she comes over, if baby has just eaten, you can leave right away, and come back in two hours. If baby hasn't fed recently, you start the date at home. Once baby gets hungry and gets a good feeding in, you can head out for the rest of your date time. . Date Nights at Home: If you can't get out, at least set aside one evening a week to prioritize your relationship: whenever baby dozes off or doesn't have any immediate needs, spend a little time focusing on each other. . Potential conflicts: The main relationship conflicts that come up for new parents are: issues related to sexuality, financial stresses, a feeling of an unfair division  of household tasks, and conflicts in parenting styles. The more you can work on these issues before baby arrives, the better!  . Fun and Frills (Don't forget these. and don't feel guilty for indulging in them!) . Everyone has something in life that is a fun little treat that they do just for themselves. It may be: reading the morning paper, or going for a daily jog, or having coffee with a friend once a week, or going to a movie on Friday  nights, or fine chocolates, or bubble baths, or curling up with a good book. . Unless you do fun things for yourself every now and then, it's hard to have the energy for fun with your baby. Whatever your "special" treats are, make sure you find a way to continue to indulge in them after your baby is born. These special moments can recharge you, and allow you to return to baby with a new joy   PERINATAL MOOD DISORDERS: MATERNAL MENTAL HEALTH FROM CONCEPTION THROUGH THE POSTPARTUM PERIOD   Emergency and Crisis Resources:  If you are an imminent risk to self or others, are experiencing intense personal distress, and/or have noticed significant changes in activities of daily living, call:  . 911 . [facility]: [phone] . Mobile Crisis: 877-626-1772 . National Suicide Hotline: 1-800-273-8255 Or visit the following crisis centers: . Local Emergency Departments . [facility]: [address],  [phone]. Hours: 8:30AM-5PM. Insurance Accepted: Medicaid, Medicare, and Uninsured.  . [facility]  [address], [place] Mon-Friday 8am-3pm  [phone]                                                                                    Non-Crisis Resources: To identify specific providers that are covered by your insurance, contact your insurance company or local agencies: [facility]: 1-800-256-2452 [facility]: 888-581-9988 [employer]: 1-800-939-5911 Postpartum Support [employer]- [place] 1-800-944-4773                                                      Outpatient therapy and medication management providers:  [facility] [phone] Hours: 9AM-5PM  Insurance Accepted: AARP, Aetna, BCBS, Cigna, [employer], Humana, Medicare  [facility] [phone] Hours: 8AM-5PM  nsurance Accepted: All insurances EXCEPT AARP, Aetna, [employer], and  [employer] [facility]: [phone]             Hours: 8AM-8PM Insurance Accepted: Aetna, BCBS, Cigna, [employer], Medicaid, Medicare, Uninsured [facility]: [phone] [facility]: [phone] Hours: 8:30AM-7PM Insurance Accepted: Aetna, BCBS, Medicaid, Medicare, Tricare, [facility]:  [phone]              Hours:9AM-5PM Insurance Accepted:  Aetna, BCBS, [facility], Medicaid, United Health Care  [facility] [phone] Hours: 9AM-5:30PM Insurance Accepted: AARP, Aetna, BCBS, Cigna, and Medicaid, Medicare, United Health Care [facility]:  [phone] Hours: 9am-5pm Insurance Accepted: BCBS; they do not accept Medicaid/Medicare   [facility]: [phone] Hours: 9am-9pm Insurance Accepted: All major insurance including Medicaid and Medicare [facility]: [phone] Hours: 9AM-5:30PM Insurance Accepted: All insurances EXCEPT Medicaid and Medicare. [facility]: [phone]                                                                       Parenting Support Groups [facility]: [phone] [facility]:  [phone] Family Support Network (support for children in the NICU and/or with special needs), [phone]                                                                   Mental Health Support Groups Mental Health Association: 336-373-1402                                                                                     Online Resources: Postpartum Support International: http://www.postpartum.net/  800-944-4PPD 2Moms Supporting Moms:  www.momssupportingmoms.net     

## 2020-04-27 ENCOUNTER — Other Ambulatory Visit: Payer: Self-pay

## 2020-04-27 ENCOUNTER — Emergency Department (HOSPITAL_COMMUNITY)
Admission: EM | Admit: 2020-04-27 | Discharge: 2020-04-27 | Disposition: A | Discharge status: Home / Self Care | Payer: Medicaid Other | Attending: Emergency Medicine | Admitting: Emergency Medicine

## 2020-04-27 ENCOUNTER — Encounter (HOSPITAL_COMMUNITY): Payer: Self-pay | Admitting: Emergency Medicine

## 2020-04-27 DIAGNOSIS — O26893 Other specified pregnancy related conditions, third trimester: Secondary | ICD-10-CM | POA: Diagnosis present

## 2020-04-27 DIAGNOSIS — O99283 Endocrine, nutritional and metabolic diseases complicating pregnancy, third trimester: Secondary | ICD-10-CM | POA: Insufficient documentation

## 2020-04-27 DIAGNOSIS — Z3A28 28 weeks gestation of pregnancy: Secondary | ICD-10-CM | POA: Diagnosis not present

## 2020-04-27 DIAGNOSIS — J069 Acute upper respiratory infection, unspecified: Secondary | ICD-10-CM | POA: Diagnosis not present

## 2020-04-27 DIAGNOSIS — J45909 Unspecified asthma, uncomplicated: Secondary | ICD-10-CM | POA: Insufficient documentation

## 2020-04-27 DIAGNOSIS — O99513 Diseases of the respiratory system complicating pregnancy, third trimester: Secondary | ICD-10-CM | POA: Insufficient documentation

## 2020-04-27 DIAGNOSIS — Z7982 Long term (current) use of aspirin: Secondary | ICD-10-CM | POA: Insufficient documentation

## 2020-04-27 DIAGNOSIS — Z20822 Contact with and (suspected) exposure to covid-19: Secondary | ICD-10-CM | POA: Insufficient documentation

## 2020-04-27 DIAGNOSIS — Z3490 Encounter for supervision of normal pregnancy, unspecified, unspecified trimester: Secondary | ICD-10-CM

## 2020-04-27 DIAGNOSIS — E039 Hypothyroidism, unspecified: Secondary | ICD-10-CM | POA: Diagnosis not present

## 2020-04-27 DIAGNOSIS — R197 Diarrhea, unspecified: Secondary | ICD-10-CM

## 2020-04-27 DIAGNOSIS — Z79899 Other long term (current) drug therapy: Secondary | ICD-10-CM | POA: Diagnosis not present

## 2020-04-27 LAB — URINALYSIS, ROUTINE W REFLEX MICROSCOPIC
Bilirubin Urine: NEGATIVE
Glucose, UA: NEGATIVE mg/dL
Hgb urine dipstick: NEGATIVE
Ketones, ur: NEGATIVE mg/dL
Nitrite: NEGATIVE
Protein, ur: NEGATIVE mg/dL
Specific Gravity, Urine: 1.023 (ref 1.005–1.030)
Squamous Epithelial / HPF: >50 — ABNORMAL HIGH (ref 0–5)
pH: 5 (ref 5.0–8.0)

## 2020-04-27 LAB — COMPREHENSIVE METABOLIC PANEL
ALT: 12 U/L (ref 0–44)
AST: 14 U/L — ABNORMAL LOW (ref 15–41)
Albumin: 3 g/dL — ABNORMAL LOW (ref 3.5–5.0)
Alkaline Phosphatase: 62 U/L (ref 38–126)
Anion gap: 9 (ref 5–15)
BUN: <5 mg/dL — ABNORMAL LOW (ref 6–20)
CO2: 21 mmol/L — ABNORMAL LOW (ref 22–32)
Calcium: 8.6 mg/dL — ABNORMAL LOW (ref 8.9–10.3)
Chloride: 107 mmol/L (ref 98–111)
Creatinine, Ser: 0.47 mg/dL (ref 0.44–1.00)
GFR calc Af Amer: >60 mL/min (ref >60)
GFR calc non Af Amer: >60 mL/min (ref >60)
Glucose, Bld: 98 mg/dL (ref 70–99)
Potassium: 3.4 mmol/L — ABNORMAL LOW (ref 3.5–5.1)
Sodium: 137 mmol/L (ref 135–145)
Total Bilirubin: 0.3 mg/dL (ref 0.3–1.2)
Total Protein: 6.3 g/dL — ABNORMAL LOW (ref 6.5–8.1)

## 2020-04-27 LAB — CBC
HCT: 35.6 % — ABNORMAL LOW (ref 36.0–46.0)
Hemoglobin: 11.7 g/dL — ABNORMAL LOW (ref 12.0–15.0)
MCH: 30.2 pg (ref 26.0–34.0)
MCHC: 32.9 g/dL (ref 30.0–36.0)
MCV: 92 fL (ref 80.0–100.0)
Platelets: 125 10*3/uL — ABNORMAL LOW (ref 150–400)
RBC: 3.87 MIL/uL (ref 3.87–5.11)
RDW: 12 % (ref 11.5–15.5)
WBC: 6.4 10*3/uL (ref 4.0–10.5)
nRBC: 0 % (ref 0.0–0.2)

## 2020-04-27 LAB — LIPASE, BLOOD: Lipase: 27 U/L (ref 11–51)

## 2020-04-27 LAB — SARS CORONAVIRUS 2 BY RT PCR (HOSPITAL ORDER, PERFORMED IN ~~LOC~~ HOSPITAL LAB): SARS Coronavirus 2: NEGATIVE

## 2020-04-27 NOTE — ED Provider Notes (Signed)
[facility] EMERGENCY DEPARTMENT Provider Note   CSN: [id number] Arrival date & time: 04/27/20  1031     History Chief Complaint  Patient presents with  . Cough  . Diarrhea    Ashlee Simmons is a 24 y.o. female.  24 year old female presents with complaint of "Covid symptoms."  Patient is [redacted] weeks pregnant with routine prenatal care, states that she has had a runny nose and dry cough with loose stools for the past 3 days.  Patient has had 2 prior negative Covid tests, presents today requesting additional Covid test.  Patient reports normal fetal movement, denies dysuria, fevers, chills, significant body aches or loss of sense of smell or taste.  Patient with known Covid exposure, states that her friend tested negative several times before finally testing positive.  No other complaints or concerns today.  Ashlee Simmons was evaluated in Emergency Department on 04/27/2020 for the symptoms described in the history of present illness. She was evaluated in the context of the global COVID-19 pandemic, which necessitated consideration that the patient might be at risk for infection with the SARS-CoV-2 virus that causes COVID-19. Institutional protocols and algorithms that pertain to the evaluation of patients at risk for COVID-19 are in a state of rapid change based on information released by regulatory bodies including the CDC and federal and state organizations. These policies and algorithms were followed during the patient's care in the ED.         Past Medical History:  Diagnosis Date  . Anxiety   . Asthma   . Depression   . PTSD (post-traumatic stress disorder)     Patient Active Problem List   Diagnosis Date Noted  . Urinary retention with incomplete bladder emptying 02/02/2020  . History of femur fracture 01/02/2020  . History of marijuana use 01/02/2020  . Hypothyroidism   . Obesity (BMI 30-39.9) 12/15/2019  . Supervision of high risk pregnancy, antepartum  12/08/2019  . PTSD (post-traumatic stress disorder)   . Anxiety   . Depression   . Asthma   . Anxiety attack 01/26/2016    Past Surgical History:  Procedure Laterality Date  . FEMUR SURGERY     plate in pelvis, rods in each femur  . HIP SURGERY    . MOUTH SURGERY       OB History    Gravida  3   Para  2   Term  2   Preterm      AB      Living  2     SAB      TAB      Ectopic      Multiple  0   Live Births  2           Family History  Problem Relation Age of Onset  . COPD Maternal Grandfather   . Thyroid disease Mother   . Thyroid disease Maternal Grandmother   . Alcohol abuse Neg Hx   . Arthritis Neg Hx   . Asthma Neg Hx   . Birth defects Neg Hx   . Cancer Neg Hx   . Depression Neg Hx   . Diabetes Neg Hx   . Drug abuse Neg Hx   . Early death Neg Hx   . Hearing loss Neg Hx   . Heart disease Neg Hx   . Hyperlipidemia Neg Hx   . Hypertension Neg Hx   . Kidney disease Neg Hx   . Learning disabilities Neg Hx   .  Mental illness Neg Hx   . Mental retardation Neg Hx   . Miscarriages / Stillbirths Neg Hx   . Stroke Neg Hx   . Vision loss Neg Hx   . Varicose Veins Neg Hx   . Intellectual disability Neg Hx   . Obesity Neg Hx   . ADD / ADHD Neg Hx   . Anxiety disorder Neg Hx     Social History   Tobacco Use  . Smoking status: Never Smoker  . Smokeless tobacco: Former Network engineer  . Vaping Use: Never used  Substance Use Topics  . Alcohol use: Not Currently    Comment: occ  . Drug use: Not Currently    Types: Marijuana    Comment: none with pregnancy    Home Medications Prior to Admission medications   Medication Sig Start Date End Date Taking? Authorizing Provider  acetaminophen (TYLENOL) 325 MG tablet Take 650 mg by mouth every 6 (six) hours as needed.    [provider]  albuterol (VENTOLIN HFA) 108 (90 Base) MCG/ACT inhaler Inhale 1-2 puffs into the lungs every 6 (six) hours as needed for wheezing or shortness of  breath. 03/07/20   Aletha Halim, MD  aspirin EC 81 MG tablet Take 1 tablet (81 mg total) by mouth daily. Patient not taking: Reported on 03/07/2020 02/02/20   Osborne Oman, MD  Blood Pressure Monitoring (BLOOD PRESSURE KIT) DEVI 1 Device by Does not apply route as needed. 12/08/19   Rasch, Anderson Malta I, NP  cyclobenzaprine (FLEXERIL) 10 MG tablet Take 1 tablet (10 mg total) by mouth 2 (two) times daily as needed for muscle spasms. 03/12/20   Laury Deep, CNM  Prenatal Vit-Fe Fumarate-FA (PRENATAL VITAMINS PO) Take 1 tablet by mouth.    [provider]  SF 5000 PLUS 1.1 % CREA dental cream See admin instructions. 09/15/19   [provider]    Allergies    Patient has no known allergies.  Review of Systems   Review of Systems  Constitutional: Negative for chills, diaphoresis and fever.  HENT: Positive for rhinorrhea. Negative for congestion.   Respiratory: Positive for cough. Negative for shortness of breath.   Cardiovascular: Negative for chest pain.  Gastrointestinal: Positive for diarrhea. Negative for abdominal pain, blood in stool, nausea and vomiting.  Genitourinary: Negative for dysuria.  Musculoskeletal: Negative for back pain.  Skin: Negative for rash and wound.  Allergic/Immunologic: Positive for immunocompromised state.  Neurological: Negative for weakness.  Psychiatric/Behavioral: Negative for confusion.  All other systems reviewed and are negative.   Physical Exam Updated Vital Signs BP 111/82   Pulse 82   Temp 98.4 F (36.9 C) (Oral)   Resp (!) 23   Ht '5\' 2"'$  (1.575 m)   Wt 83.9 kg   LMP 10/08/2019 (Exact Date)   SpO2 100%   BMI 33.84 kg/m   Physical Exam Vitals and nursing note reviewed.  Constitutional:      General: She is not in acute distress.    Appearance: She is well-developed. She is not diaphoretic.  HENT:     Head: Normocephalic and atraumatic.  Cardiovascular:     Rate and Rhythm: Normal rate and regular rhythm.      Pulses: Normal pulses.     Heart sounds: Normal heart sounds.  Pulmonary:     Effort: Pulmonary effort is normal.     Breath sounds: Normal breath sounds.  Abdominal:     Palpations: Abdomen is soft.     Comments:  gravid  Musculoskeletal:     Cervical back: Neck supple.     Right lower leg: No edema.     Left lower leg: No edema.  Lymphadenopathy:     Cervical: No cervical adenopathy.  Skin:    General: Skin is warm and dry.     Findings: No erythema or rash.  Neurological:     Mental Status: She is alert and oriented to person, place, and time.  Psychiatric:        Behavior: Behavior normal.     ED Results / Procedures / Treatments   Labs (all labs ordered are listed, but only abnormal results are displayed) Labs Reviewed  COMPREHENSIVE METABOLIC PANEL - Abnormal; Notable for the following components:      Result Value   Potassium 3.4 (*)    CO2 21 (*)    BUN <5 (*)    Calcium 8.6 (*)    Total Protein 6.3 (*)    Albumin 3.0 (*)    AST 14 (*)    All other components within normal limits  CBC - Abnormal; Notable for the following components:   Hemoglobin 11.7 (*)    HCT 35.6 (*)    Platelets 125 (*)    All other components within normal limits  URINALYSIS, ROUTINE W REFLEX MICROSCOPIC - Abnormal; Notable for the following components:   Color, Urine AMBER (*)    APPearance CLOUDY (*)    Leukocytes,Ua LARGE (*)    Bacteria, UA MANY (*)    Squamous Epithelial / LPF >50 (*)    All other components within normal limits  SARS CORONAVIRUS 2 BY RT PCR (HOSPITAL ORDER, [facility])  LIPASE, BLOOD    EKG None EKG: normal EKG, normal sinus rhythm, unchanged from previous tracings. Rate 86  Radiology No results found.  Procedures Procedures (including critical care time)  Medications Ordered in ED Medications - No data to display  ED Course  I have reviewed the triage vital signs and the nursing notes.  Pertinent labs & imaging  results that were available during my care of the patient were reviewed by me and considered in my medical decision making (see chart for details).  Clinical Course as of Apr 27 1748  Fri Apr 27, 3172  6542 24 year old female presents with concern for COVID after exposure to a friend that tested positive. Patient has had 2 prior negative tests, reports runny nose, mild cough, diarrhea, requesting testing. Patient is [redacted] weeks pregnant with routine prenatal care, reports normal fetal movement today, FHT 138, abdomen is soft and non tender.  Patient is well appearing. EKG NSR. CBC with platelets 125, LFTs normal, renal function normal. UA contaminated, no urinary symptoms today.  COVID swab sent, recommend quarantine regardless of results due to positive exposure with URI symptoms. Recommend follow up with PCP/OB regardless of her test results for appropriate monitoring.    [LM]    Clinical Course User Index [LM] Roque Lias   MDM Rules/Calculators/A&P                          Final Clinical Impression(s) / ED Diagnoses Final diagnoses:  Viral upper respiratory tract infection  Diarrhea, unspecified type  Pregnancy, unspecified gestational age    Rx / DC Orders ED Discharge Orders    None       Roque Lias 04/27/20 1749    Virgel Manifold, MD 04/27/20  2304  

## 2020-04-27 NOTE — ED Triage Notes (Signed)
Reports she was exposed to someone with covid on Sunday, got tested via rapid and molecular test 2 days ago-both negative. Reports diarrhea, cough and body aches x2 days denies fever. Pt is currently 28w 6d pregnant. Reports normal fetal movement.

## 2020-04-27 NOTE — Discharge Instructions (Addendum)
Bland foods (bananas, rice, dry toast, applesauce) until diarrhea subsides. Advance diet slowly as tolerated. Tylenol as needed as directed. Follow up with your OB. Return to ER for worsening or concerning symptoms. Follow up for COVID results in your mychart, recommend quarantine for 10 days due to exposure to COVID + person with symptoms regardless of your test results today. If you would like to be tested again, recommend testing through the [facility] or area pharmacy ([facility], [facility] both offer free testing).

## 2020-05-02 ENCOUNTER — Ambulatory Visit (HOSPITAL_COMMUNITY): Payer: Medicaid Other | Admitting: Psychiatry

## 2020-05-04 ENCOUNTER — Telehealth (INDEPENDENT_AMBULATORY_CARE_PROVIDER_SITE_OTHER): Payer: Medicaid Other | Admitting: Lactation Services

## 2020-05-04 DIAGNOSIS — O0990 Supervision of high risk pregnancy, unspecified, unspecified trimester: Secondary | ICD-10-CM

## 2020-05-04 NOTE — Telephone Encounter (Signed)
Patient called in with concerns that she is having Pre-Eclampsia symptoms.   Patient reports she has had flashes of light this morning and then she threw up. She is also having rib pain that been going on for weeks. Patient reports dizziness her whole pregnancy. Patient reports she has a history of Migraines but has not had since the pregnancy, she said its kinda like them but different also. She has a daith piercing.   She has had a headache on and off for the past few days, Tylenol helps and will come back the next day. BP this morning 106/76  sitting down just after eye sight changes and throwing up. She reports mild swelling to feet, none to face or hands.   Patient reports she is drinking ok. She is voiding well. She is not eating as well as she did after throwing up. Enc her to try small frequent bland meals/snacks.   Patient exposed to Covid 2 weeks ago with 3 negative Covid tests. She has not been in contact with + persons since.   Reviewed drinking 2 liters of fluid daily and  small frequent meals. Reviewed going to MAU for persistent headache or elevated BP of > 140/90 and either of the above accompanied by Blurred vision and/or dizziness to go to MAU for evaluation. Patient reports she is aware of location of MAU.

## 2020-05-09 ENCOUNTER — Other Ambulatory Visit: Payer: Self-pay

## 2020-05-09 ENCOUNTER — Other Ambulatory Visit: Payer: Medicaid Other

## 2020-05-09 DIAGNOSIS — O0990 Supervision of high risk pregnancy, unspecified, unspecified trimester: Secondary | ICD-10-CM

## 2020-05-10 ENCOUNTER — Other Ambulatory Visit: Payer: Self-pay | Admitting: Medical

## 2020-05-10 DIAGNOSIS — E038 Other specified hypothyroidism: Secondary | ICD-10-CM

## 2020-05-10 LAB — HIV ANTIBODY (ROUTINE TESTING W REFLEX): HIV Screen 4th Generation wRfx: NONREACTIVE

## 2020-05-10 LAB — RPR: RPR Ser Ql: NONREACTIVE

## 2020-05-10 LAB — CBC
Hematocrit: 34 % (ref 34.0–46.6)
Hemoglobin: 11.6 g/dL (ref 11.1–15.9)
MCH: 31.2 pg (ref 26.6–33.0)
MCHC: 34.1 g/dL (ref 31.5–35.7)
MCV: 91 fL (ref 79–97)
Platelets: 121 10*3/uL — ABNORMAL LOW (ref 150–450)
RBC: 3.72 x10E6/uL — ABNORMAL LOW (ref 3.77–5.28)
RDW: 12 % (ref 11.7–15.4)
WBC: 6.9 10*3/uL (ref 3.4–10.8)

## 2020-05-10 LAB — THYROID PANEL WITH TSH
Free Thyroxine Index: 0.8 — ABNORMAL LOW (ref 1.2–4.9)
T3 Uptake Ratio: 9 % — ABNORMAL LOW (ref 24–39)
T4, Total: 8.8 ug/dL (ref 4.5–12.0)
TSH: 3.13 u[IU]/mL (ref 0.450–4.500)

## 2020-05-10 LAB — GLUCOSE TOLERANCE, 2 HOURS W/ 1HR
Glucose, 1 hour: 134 mg/dL (ref 65–179)
Glucose, 2 hour: 109 mg/dL (ref 65–152)
Glucose, Fasting: 79 mg/dL (ref 65–91)

## 2020-05-16 ENCOUNTER — Ambulatory Visit (INDEPENDENT_AMBULATORY_CARE_PROVIDER_SITE_OTHER): Payer: Medicaid Other | Admitting: Obstetrics and Gynecology

## 2020-05-16 ENCOUNTER — Other Ambulatory Visit: Payer: Self-pay

## 2020-05-16 VITALS — BP 107/63 | HR 96 | Wt 189.9 lb

## 2020-05-16 DIAGNOSIS — E669 Obesity, unspecified: Secondary | ICD-10-CM

## 2020-05-16 DIAGNOSIS — O0990 Supervision of high risk pregnancy, unspecified, unspecified trimester: Secondary | ICD-10-CM

## 2020-05-16 DIAGNOSIS — F419 Anxiety disorder, unspecified: Secondary | ICD-10-CM

## 2020-05-16 DIAGNOSIS — E038 Other specified hypothyroidism: Secondary | ICD-10-CM

## 2020-05-16 LAB — SPECIMEN STATUS REPORT

## 2020-05-16 LAB — T4, FREE: Free T4: 0.81 ng/dL — ABNORMAL LOW (ref 0.82–1.77)

## 2020-05-16 LAB — T3, FREE: T3, Free: 2.8 pg/mL (ref 2.0–4.4)

## 2020-05-16 NOTE — Progress Notes (Signed)
   PRENATAL VISIT NOTE  Subjective:  Ashlee Simmons is a 24 y.o. G3P2002 at [redacted]w[redacted]d being seen today for ongoing prenatal care.  She is currently monitored for the following issues for this high-risk pregnancy and has Anxiety attack; Supervision of high risk pregnancy, antepartum; PTSD (post-traumatic stress disorder); Anxiety; Depression; Asthma; Obesity (BMI 30-39.9); History of femur fracture; History of marijuana use; Hypothyroidism; and Urinary retention with incomplete bladder emptying on their problem list.  Patient doing well with no acute concerns today. She reports no complaints.  Contractions: Not present. Vag. Bleeding: None.  Movement: Present. Denies leaking of fluid.   Pt concerned about T4, labs reviewed, current value 0.81, just out of range with normal being 0.82  The following portions of the patient's history were reviewed and updated as appropriate: allergies, current medications, past family history, past medical history, past social history, past surgical history and problem list. Problem list updated.  Objective:   Vitals:   05/16/20 1432  BP: 107/63  Pulse: 96  Weight: 189 lb 14.4 oz (86.1 kg)    Fetal Status: Fetal Heart Rate (bpm): 138   Movement: Present     General:  Alert, oriented and cooperative. Patient is in no acute distress.  Skin: Skin is warm and dry. No rash noted.   Cardiovascular: Normal heart rate noted  Respiratory: Normal respiratory effort, no problems with respiration noted  Abdomen: Soft, gravid, appropriate for gestational age.  Pain/Pressure: Absent     Pelvic: Cervical exam deferred        Extremities: Normal range of motion.  Edema: None  Mental Status:  Normal mood and affect. Normal behavior. Normal judgment and thought content.   Assessment and Plan:  Pregnancy: G3P2002 at [redacted]w[redacted]d  1. Other specified hypothyroidism TSH was normal and T4 barely decreased Consider recheck of TSH and T4 in 2 weeks, if both are significantly abnormal  consider initiating synthroid  2. Supervision of high risk pregnancy, antepartum   3. Obesity (BMI 30-39.9)   4. Anxiety   Preterm labor symptoms and general obstetric precautions including but not limited to vaginal bleeding, contractions, leaking of fluid and fetal movement were reviewed in detail with the patient.  Please refer to After Visit Summary for other counseling recommendations.   Return in about 2 weeks (around 05/30/2020) for [facility] [place], in person.   Mariel Aloe, MD

## 2020-05-16 NOTE — Patient Instructions (Signed)

## 2020-05-31 ENCOUNTER — Encounter: Payer: Self-pay | Admitting: *Deleted

## 2020-06-04 ENCOUNTER — Encounter: Payer: Medicaid Other | Admitting: Obstetrics and Gynecology

## 2020-06-08 ENCOUNTER — Telehealth (INDEPENDENT_AMBULATORY_CARE_PROVIDER_SITE_OTHER): Payer: Medicaid Other | Admitting: Lactation Services

## 2020-06-08 DIAGNOSIS — O0990 Supervision of high risk pregnancy, unspecified, unspecified trimester: Secondary | ICD-10-CM

## 2020-06-08 NOTE — Telephone Encounter (Signed)
Patient called in with concerns that she was shaving and cut her labia. Patient reports is on the outer labia and is superficial. She was holding pressure in the shower and is now wearing a pad and is still bleeding some.   Reviewed holding pressure to the area for 10-15 minutes and maybe an ice pack through a cloth for about 10 minutes to help stop bleeding. Reviewed watching for infection and applying vaseline or Neosporin to area to prevent it from sticking to her underwear or pad.   Patient voiced understanding.

## 2020-06-13 ENCOUNTER — Other Ambulatory Visit: Payer: Self-pay

## 2020-06-13 ENCOUNTER — Ambulatory Visit (INDEPENDENT_AMBULATORY_CARE_PROVIDER_SITE_OTHER): Payer: Medicaid Other | Admitting: Obstetrics & Gynecology

## 2020-06-13 VITALS — BP 123/74 | HR 109 | Wt 192.0 lb

## 2020-06-13 DIAGNOSIS — O0990 Supervision of high risk pregnancy, unspecified, unspecified trimester: Secondary | ICD-10-CM | POA: Diagnosis not present

## 2020-06-13 DIAGNOSIS — E038 Other specified hypothyroidism: Secondary | ICD-10-CM

## 2020-06-13 DIAGNOSIS — E669 Obesity, unspecified: Secondary | ICD-10-CM

## 2020-06-13 DIAGNOSIS — O368130 Decreased fetal movements, third trimester, not applicable or unspecified: Secondary | ICD-10-CM

## 2020-06-13 NOTE — Progress Notes (Signed)
   PRENATAL VISIT NOTE  Subjective:  Ashlee Simmons is a 24 y.o. G3P2002 at [redacted]w[redacted]d being seen today for ongoing prenatal care.  She is currently monitored for the following issues for this high-risk pregnancy and has Anxiety attack; Supervision of high risk pregnancy, antepartum; PTSD (post-traumatic stress disorder); Anxiety; Depression; Asthma; Obesity (BMI 30-39.9); History of femur fracture; History of marijuana use; Hypothyroidism; and Urinary retention with incomplete bladder emptying on their problem list.  Patient reports decreased fetal movement.  Contractions: Irritability. Vag. Bleeding: None.  Movement: (!) Decreased. Denies leaking of fluid.   The following portions of the patient's history were reviewed and updated as appropriate: allergies, current medications, past family history, past medical history, past social history, past surgical history and problem list.   Objective:   Vitals:   06/13/20 1635  BP: 123/74  Pulse: (!) 109  Weight: 192 lb (87.1 kg)    Fetal Status: Fetal Heart Rate (bpm): NST   Movement: (!) Decreased     General:  Alert, oriented and cooperative. Patient is in no acute distress.  Skin: Skin is warm and dry. No rash noted.   Cardiovascular: Normal heart rate noted  Respiratory: Normal respiratory effort, no problems with respiration noted  Abdomen: Soft, gravid, appropriate for gestational age.  Pain/Pressure: Present     Pelvic: Cervical exam deferred        Extremities: Normal range of motion.  Edema: Trace  Mental Status: Normal mood and affect. Normal behavior. Normal judgment and thought content.   Assessment and Plan:  Pregnancy: G3P2002 at [redacted]w[redacted]d 1. Supervision of high risk pregnancy, antepartum Reactive NST noted  2. Obesity (BMI 30-39.9)   3. Other specified hypothyroidism Outpatient Medications Prior to Visit  Medication Sig  . acetaminophen (TYLENOL) 325 MG tablet Take 650 mg by mouth every 6 (six) hours as needed.  Marland Kitchen albuterol  (VENTOLIN HFA) 108 (90 Base) MCG/ACT inhaler Inhale 1-2 puffs into the lungs every 6 (six) hours as needed for wheezing or shortness of breath.  . Blood Pressure Monitoring (BLOOD PRESSURE KIT) DEVI 1 Device by Does not apply route as needed.  . Prenatal Vit-Fe Fumarate-FA (PRENATAL VITAMINS PO) Take 1 tablet by mouth.  . SF 5000 PLUS 1.1 % CREA dental cream See admin instructions.  . [DISCONTINUED] aspirin EC 81 MG tablet Take 1 tablet (81 mg total) by mouth daily. (Patient not taking: Reported on 03/07/2020)  . [DISCONTINUED] cyclobenzaprine (FLEXERIL) 10 MG tablet Take 1 tablet (10 mg total) by mouth 2 (two) times daily as needed for muscle spasms.   No facility-administered medications prior to visit.   Normal TSH no medication  4. Decreased fetal movements in third trimester, single or unspecified fetus Reactive today, instructions for fetal movement count - Fetal nonstress test; Future  Preterm labor symptoms and general obstetric precautions including but not limited to vaginal bleeding, contractions, leaking of fluid and fetal movement were reviewed in detail with the patient. Please refer to After Visit Summary for other counseling recommendations.   Return in about 1 week (around 06/20/2020) for GBS, gc/ch.  Future Appointments  Date Time Provider [place]  06/20/2020  3:35 PM Cherre Blanc, MD [facility]    Emeterio Reeve, MD

## 2020-06-13 NOTE — Patient Instructions (Signed)
Fetal Movement Counts Patient Name: ________________________________________________ Patient Due Date: ____________________ What is a fetal movement count?  A fetal movement count is the number of times that you feel your baby move during a certain amount of time. This may also be called a fetal kick count. A fetal movement count is recommended for every pregnant woman. You may be asked to start counting fetal movements as early as week 28 of your pregnancy. Pay attention to when your baby is most active. You may notice your baby's sleep and wake cycles. You may also notice things that make your baby move more. You should do a fetal movement count:  When your baby is normally most active.  At the same time each day. A good time to count movements is while you are resting, after having something to eat and drink. How do I count fetal movements? 1. Find a quiet, comfortable area. Sit, or lie down on your side. 2. Write down the date, the start time and stop time, and the number of movements that you felt between those two times. Take this information with you to your health care visits. 3. Write down your start time when you feel the first movement. 4. Count kicks, flutters, swishes, rolls, and jabs. You should feel at least 10 movements. 5. You may stop counting after you have felt 10 movements, or if you have been counting for 2 hours. Write down the stop time. 6. If you do not feel 10 movements in 2 hours, contact your health care provider for further instructions. Your health care provider may want to do additional tests to assess your baby's well-being. Contact a health care provider if:  You feel fewer than 10 movements in 2 hours.  Your baby is not moving like he or she usually does. Date: ____________ Start time: ____________ Stop time: ____________ Movements: ____________ Date: ____________ Start time: ____________ Stop time: ____________ Movements: ____________ Date: ____________  Start time: ____________ Stop time: ____________ Movements: ____________ Date: ____________ Start time: ____________ Stop time: ____________ Movements: ____________ Date: ____________ Start time: ____________ Stop time: ____________ Movements: ____________ Date: ____________ Start time: ____________ Stop time: ____________ Movements: ____________ Date: ____________ Start time: ____________ Stop time: ____________ Movements: ____________ Date: ____________ Start time: ____________ Stop time: ____________ Movements: ____________ Date: ____________ Start time: ____________ Stop time: ____________ Movements: ____________ This information is not intended to replace advice given to you by your health care provider. Make sure you discuss any questions you have with your health care provider. Document Revised: 04/28/2019 Document Reviewed: 04/28/2019 Elsevier Patient Education  2020 [employer] Inc[employer]  

## 2020-06-20 ENCOUNTER — Ambulatory Visit (INDEPENDENT_AMBULATORY_CARE_PROVIDER_SITE_OTHER): Payer: Medicaid Other | Admitting: Obstetrics & Gynecology

## 2020-06-20 ENCOUNTER — Other Ambulatory Visit (HOSPITAL_COMMUNITY)
Admission: RE | Admit: 2020-06-20 | Discharge: 2020-06-20 | Disposition: A | Discharge status: Home / Self Care | Payer: Medicaid Other | Source: Ambulatory Visit | Attending: Obstetrics & Gynecology | Admitting: Obstetrics & Gynecology

## 2020-06-20 ENCOUNTER — Other Ambulatory Visit: Payer: Self-pay

## 2020-06-20 ENCOUNTER — Encounter: Payer: Self-pay | Admitting: Obstetrics & Gynecology

## 2020-06-20 VITALS — BP 106/77 | HR 97 | Wt 191.1 lb

## 2020-06-20 DIAGNOSIS — F4310 Post-traumatic stress disorder, unspecified: Secondary | ICD-10-CM

## 2020-06-20 DIAGNOSIS — F410 Panic disorder [episodic paroxysmal anxiety] without agoraphobia: Secondary | ICD-10-CM

## 2020-06-20 DIAGNOSIS — O0990 Supervision of high risk pregnancy, unspecified, unspecified trimester: Secondary | ICD-10-CM | POA: Diagnosis not present

## 2020-06-20 DIAGNOSIS — E669 Obesity, unspecified: Secondary | ICD-10-CM

## 2020-06-20 NOTE — Progress Notes (Signed)
  Patient ID: Ashlee Simmons, female   DOB: [birth year], 24 y.o.   MRN: [id number]   PRENATAL VISIT NOTE  Subjective:  Ashlee Simmons is a 24 y.o. G3P2002 at [redacted]w[redacted]d being seen today for ongoing prenatal care.  She is currently monitored for the following issues for this high-risk pregnancy and has Anxiety attack; Supervision of high risk pregnancy, antepartum; PTSD (post-traumatic stress disorder); Anxiety; Depression; Asthma; Obesity (BMI 30-39.9); History of femur fracture; History of marijuana use; Hypothyroidism; and Urinary retention with incomplete bladder emptying on their problem list.  Patient reports no bleeding, no cramping, no leaking and occasional contractions.  Contractions: Irregular. Vag. Bleeding: None.  Movement: Present. Denies leaking of fluid.   The following portions of the patient's history were reviewed and updated as appropriate: allergies, current medications, past family history, past medical history, past social history, past surgical history and problem list.   Objective:   Vitals:   06/20/20 1559  BP: 106/77  Pulse: 97  Weight: 191 lb 1.6 oz (86.7 kg)    Fetal Status: Fetal Heart Rate (bpm): 135   Movement: Present     General:  Alert, oriented and cooperative. Patient is in no acute distress.  Skin: Skin is warm and dry. No rash noted.   Cardiovascular: Normal heart rate noted  Respiratory: Normal respiratory effort, no problems with respiration noted  Abdomen: Soft, gravid, appropriate for gestational age.  Pain/Pressure: Present     Pelvic: Cervical exam performed in the presence of a chaperone0/0/-4        Extremities: Normal range of motion.  Edema: None  Mental Status: Normal mood and affect. Normal behavior. Normal judgment and thought content.   Assessment and Plan:  Pregnancy: G3P2002 at [redacted]w[redacted]d 1. Supervision of high risk pregnancy, antepartum  - GC/Chlamydia probe amp ([place])not at [facility] - Culture, beta strep (group b only)  2. Anxiety  attack  3. PTSD (post-traumatic stress disorder)  4. Obesity (BMI 30-39.9)  Preterm labor symptoms and general obstetric precautions including but not limited to vaginal bleeding, contractions, leaking of fluid and fetal movement were reviewed in detail with the patient. Please refer to After Visit Summary for other counseling recommendations.   Return in 1 week (on 06/27/2020).  No future appointments.  Malachy Chamber, MD

## 2020-06-20 NOTE — Patient Instructions (Signed)
Third Trimester of Pregnancy The third trimester is from week 28 through week 40 (months 7 through 9). The third trimester is a time when the unborn baby (fetus) is growing rapidly. At the end of the ninth month, the fetus is about 20 inches in length and weighs 6-10 pounds. Body changes during your third trimester Your body will continue to go through many changes during pregnancy. The changes vary from woman to woman. During the third trimester:  Your weight will continue to increase. You can expect to gain 25-35 pounds (11-16 kg) by the end of the pregnancy.  You may begin to get stretch marks on your hips, abdomen, and breasts.  You may urinate more often because the fetus is moving lower into your pelvis and pressing on your bladder.  You may develop or continue to have heartburn. This is caused by increased hormones that slow down muscles in the digestive tract.  You may develop or continue to have constipation because increased hormones slow digestion and cause the muscles that push waste through your intestines to relax.  You may develop hemorrhoids. These are swollen veins (varicose veins) in the rectum that can itch or be painful.  You may develop swollen, bulging veins (varicose veins) in your legs.  You may have increased body aches in the pelvis, back, or thighs. This is due to weight gain and increased hormones that are relaxing your joints.  You may have changes in your hair. These can include thickening of your hair, rapid growth, and changes in texture. Some women also have hair loss during or after pregnancy, or hair that feels dry or thin. Your hair will most likely return to normal after your baby is born.  Your breasts will continue to grow and they will continue to become tender. A yellow fluid (colostrum) may leak from your breasts. This is the first milk you are producing for your baby.  Your belly button may stick out.  You may notice more swelling in your hands,  face, or ankles.  You may have increased tingling or numbness in your hands, arms, and legs. The skin on your belly may also feel numb.  You may feel short of breath because of your expanding uterus.  You may have more problems sleeping. This can be caused by the size of your belly, increased need to urinate, and an increase in your body's metabolism.  You may notice the fetus "dropping," or moving lower in your abdomen (lightening).  You may have increased vaginal discharge.  You may notice your joints feel loose and you may have pain around your pelvic bone. What to expect at prenatal visits You will have prenatal exams every 2 weeks until week 36. Then you will have weekly prenatal exams. During a routine prenatal visit:  You will be weighed to make sure you and the baby are growing normally.  Your blood pressure will be taken.  Your abdomen will be measured to track your baby's growth.  The fetal heartbeat will be listened to.  Any test results from the previous visit will be discussed.  You may have a cervical check near your due date to see if your cervix has softened or thinned (effaced).  You will be tested for Group B streptococcus. This happens between 35 and 37 weeks. Your health care provider may ask you:  What your birth plan is.  How you are feeling.  If you are feeling the baby move.  If you have had any abnormal   symptoms, such as leaking fluid, bleeding, severe headaches, or abdominal cramping.  If you are using any tobacco products, including cigarettes, chewing tobacco, and electronic cigarettes.  If you have any questions. Other tests or screenings that may be performed during your third trimester include:  Blood tests that check for low iron levels (anemia).  Fetal testing to check the health, activity level, and growth of the fetus. Testing is done if you have certain medical conditions or if there are problems during the pregnancy.  Nonstress test  (NST). This test checks the health of your baby to make sure there are no signs of problems, such as the baby not getting enough oxygen. During this test, a belt is placed around your belly. The baby is made to move, and its heart rate is monitored during movement. What is false labor? False labor is a condition in which you feel small, irregular tightenings of the muscles in the womb (contractions) that usually go away with rest, changing position, or drinking water. These are called Braxton Hicks contractions. Contractions may last for hours, days, or even weeks before true labor sets in. If contractions come at regular intervals, become more frequent, increase in intensity, or become painful, you should see your health care provider. What are the signs of labor?  Abdominal cramps.  Regular contractions that start at 10 minutes apart and become stronger and more frequent with time.  Contractions that start on the top of the uterus and spread down to the lower abdomen and back.  Increased pelvic pressure and dull back pain.  A watery or bloody mucus discharge that comes from the vagina.  Leaking of amniotic fluid. This is also known as your "water breaking." It could be a slow trickle or a gush. Let your health care provider know if it has a color or strange odor. If you have any of these signs, call your health care provider right away, even if it is before your due date. Follow these instructions at home: Medicines  Follow your health care provider's instructions regarding medicine use. Specific medicines may be either safe or unsafe to take during pregnancy.  Take a prenatal vitamin that contains at least 600 micrograms (mcg) of folic acid.  If you develop constipation, try taking a stool softener if your health care provider approves. Eating and drinking   Eat a balanced diet that includes fresh fruits and vegetables, whole grains, good sources of protein such as meat, eggs, or tofu,  and low-fat dairy. Your health care provider will help you determine the amount of weight gain that is right for you.  Avoid raw meat and uncooked cheese. These carry germs that can cause birth defects in the baby.  If you have low calcium intake from food, talk to your health care provider about whether you should take a daily calcium supplement.  Eat four or five small meals rather than three large meals a day.  Limit foods that are high in fat and processed sugars, such as fried and sweet foods.  To prevent constipation: ? Drink enough fluid to keep your urine clear or pale yellow. ? Eat foods that are high in fiber, such as fresh fruits and vegetables, whole grains, and beans. Activity  Exercise only as directed by your health care provider. Most women can continue their usual exercise routine during pregnancy. Try to exercise for 30 minutes at least 5 days a week. Stop exercising if you experience uterine contractions.  Avoid heavy lifting.  Do   not exercise in extreme heat or humidity, or at high altitudes.  Wear low-heel, comfortable shoes.  Practice good posture.  You may continue to have sex unless your health care provider tells you otherwise. Relieving pain and discomfort  Take frequent breaks and rest with your legs elevated if you have leg cramps or low back pain.  Take warm sitz baths to soothe any pain or discomfort caused by hemorrhoids. Use hemorrhoid cream if your health care provider approves.  Wear a good support bra to prevent discomfort from breast tenderness.  If you develop varicose veins: ? Wear support pantyhose or compression stockings as told by your healthcare provider. ? Elevate your feet for 15 minutes, 3-4 times a day. Prenatal care  Write down your questions. Take them to your prenatal visits.  Keep all your prenatal visits as told by your health care provider. This is important. Safety  Wear your seat belt at all times when driving.  Make  a list of emergency phone numbers, including numbers for family, friends, the hospital, and police and fire departments. General instructions  Avoid cat litter boxes and soil used by cats. These carry germs that can cause birth defects in the baby. If you have a cat, ask someone to clean the litter box for you.  Do not travel far distances unless it is absolutely necessary and only with the approval of your health care provider.  Do not use hot tubs, steam rooms, or saunas.  Do not drink alcohol.  Do not use any products that contain nicotine or tobacco, such as cigarettes and e-cigarettes. If you need help quitting, ask your health care provider.  Do not use any medicinal herbs or unprescribed drugs. These chemicals affect the formation and growth of the baby.  Do not douche or use tampons or scented sanitary pads.  Do not cross your legs for long periods of time.  To prepare for the arrival of your baby: ? Take prenatal classes to understand, practice, and ask questions about labor and delivery. ? Make a trial run to the hospital. ? Visit the hospital and tour the maternity area. ? Arrange for maternity or paternity leave through employers. ? Arrange for family and friends to take care of pets while you are in the hospital. ? Purchase a rear-facing car seat and make sure you know how to install it in your car. ? Pack your hospital bag. ? Prepare the baby's nursery. Make sure to remove all pillows and stuffed animals from the baby's crib to prevent suffocation.  Visit your dentist if you have not gone during your pregnancy. Use a soft toothbrush to brush your teeth and be gentle when you floss. Contact a health care provider if:  You are unsure if you are in labor or if your water has broken.  You become dizzy.  You have mild pelvic cramps, pelvic pressure, or nagging pain in your abdominal area.  You have lower back pain.  You have persistent nausea, vomiting, or  diarrhea.  You have an unusual or bad smelling vaginal discharge.  You have pain when you urinate. Get help right away if:  Your water breaks before 37 weeks.  You have regular contractions less than 5 minutes apart before 37 weeks.  You have a fever.  You are leaking fluid from your vagina.  You have spotting or bleeding from your vagina.  You have severe abdominal pain or cramping.  You have rapid weight loss or weight gain.  You have   shortness of breath with chest pain.  You notice sudden or extreme swelling of your face, hands, ankles, feet, or legs.  Your baby makes fewer than 10 movements in 2 hours.  You have severe headaches that do not go away when you take medicine.  You have vision changes. Summary  The third trimester is from week 28 through week 40, months 7 through 9. The third trimester is a time when the unborn baby (fetus) is growing rapidly.  During the third trimester, your discomfort may increase as you and your baby continue to gain weight. You may have abdominal, leg, and back pain, sleeping problems, and an increased need to urinate.  During the third trimester your breasts will keep growing and they will continue to become tender. A yellow fluid (colostrum) may leak from your breasts. This is the first milk you are producing for your baby.  False labor is a condition in which you feel small, irregular tightenings of the muscles in the womb (contractions) that eventually go away. These are called Braxton Hicks contractions. Contractions may last for hours, days, or even weeks before true labor sets in.  Signs of labor can include: abdominal cramps; regular contractions that start at 10 minutes apart and become stronger and more frequent with time; watery or bloody mucus discharge that comes from the vagina; increased pelvic pressure and dull back pain; and leaking of amniotic fluid. This information is not intended to replace advice given to you by your  health care provider. Make sure you discuss any questions you have with your health care provider. Document Revised: 12/30/2018 Document Reviewed: 10/14/2016 Elsevier Patient Education  2020 [employer]  

## 2020-06-20 NOTE — Progress Notes (Signed)
Declines flu vaccine. Nyeemah Jennette,RN

## 2020-06-21 LAB — GC/CHLAMYDIA PROBE AMP (~~LOC~~) NOT AT ARMC
Chlamydia: NEGATIVE
Comment: NEGATIVE
Comment: NORMAL
Neisseria Gonorrhea: NEGATIVE

## 2020-06-24 LAB — CULTURE, BETA STREP (GROUP B ONLY): Strep Gp B Culture: NEGATIVE

## 2020-06-25 ENCOUNTER — Telehealth: Payer: Self-pay | Admitting: Family Medicine

## 2020-06-25 NOTE — Telephone Encounter (Signed)
Patient said her Job is requesting a letter that she will be going out on Maternity Leave early, state she need this letter today.  Fax Number [phone]

## 2020-06-26 ENCOUNTER — Encounter: Payer: Self-pay | Admitting: *Deleted

## 2020-06-26 ENCOUNTER — Inpatient Hospital Stay (HOSPITAL_COMMUNITY)
Admission: AD | Admit: 2020-06-26 | Discharge: 2020-06-26 | Disposition: A | Discharge status: Home / Self Care | Payer: Medicaid Other | Attending: Obstetrics and Gynecology | Admitting: Obstetrics and Gynecology

## 2020-06-26 ENCOUNTER — Encounter (HOSPITAL_COMMUNITY): Payer: Self-pay | Admitting: Obstetrics and Gynecology

## 2020-06-26 ENCOUNTER — Other Ambulatory Visit: Payer: Self-pay

## 2020-06-26 DIAGNOSIS — R0781 Pleurodynia: Secondary | ICD-10-CM

## 2020-06-26 DIAGNOSIS — J45909 Unspecified asthma, uncomplicated: Secondary | ICD-10-CM | POA: Insufficient documentation

## 2020-06-26 DIAGNOSIS — O99513 Diseases of the respiratory system complicating pregnancy, third trimester: Secondary | ICD-10-CM | POA: Diagnosis not present

## 2020-06-26 DIAGNOSIS — O26893 Other specified pregnancy related conditions, third trimester: Secondary | ICD-10-CM | POA: Diagnosis not present

## 2020-06-26 DIAGNOSIS — Z3A37 37 weeks gestation of pregnancy: Secondary | ICD-10-CM | POA: Diagnosis not present

## 2020-06-26 DIAGNOSIS — R110 Nausea: Secondary | ICD-10-CM

## 2020-06-26 DIAGNOSIS — O99891 Other specified diseases and conditions complicating pregnancy: Secondary | ICD-10-CM | POA: Diagnosis not present

## 2020-06-26 LAB — URINALYSIS, ROUTINE W REFLEX MICROSCOPIC
Bilirubin Urine: NEGATIVE
Glucose, UA: NEGATIVE mg/dL
Hgb urine dipstick: NEGATIVE
Ketones, ur: NEGATIVE mg/dL
Nitrite: NEGATIVE
Protein, ur: 30 mg/dL — AB
Specific Gravity, Urine: 1.023 (ref 1.005–1.030)
WBC, UA: >50 WBC/hpf — ABNORMAL HIGH (ref 0–5)
pH: 6 (ref 5.0–8.0)

## 2020-06-26 MED ORDER — CYCLOBENZAPRINE HCL 5 MG PO TABS
10.0000 mg | ORAL_TABLET | Freq: Once | ORAL | Status: AC
  Administered 2020-06-26: 10 mg via ORAL
  Filled 2020-06-26: qty 2

## 2020-06-26 MED ORDER — ACETAMINOPHEN 500 MG PO TABS
1000.0000 mg | ORAL_TABLET | Freq: Once | ORAL | Status: AC
  Administered 2020-06-26: 1000 mg via ORAL
  Filled 2020-06-26: qty 2

## 2020-06-26 MED ORDER — CYCLOBENZAPRINE HCL 5 MG PO TABS: 5.0000 mg | ORAL_TABLET | Freq: Three times a day (TID) | ORAL | 0 refills | Status: DC | PRN

## 2020-06-26 NOTE — MAU Provider Note (Signed)
History     CSN: [id number]  Arrival date and time: 06/26/20 2001   First Provider Initiated Contact with Patient 06/26/20 2101      Chief Complaint  Patient presents with  . Chest Pain    left rib pain  . Nausea   Ashlee Simmons is a 24 y.o. [id number] at [redacted]w[redacted]d who presents with rib pain.  Symptoms started at 27 wks but worsened today. Reports pain under her left ribs. Pain is worse with sitting straight up & lying down. Reports she has this issue with her pregnancies due to baby positioning. Took flexeril earlier in the pregnancy with some relief but ran out. Took 1 gm of tylenol at 1 pm today with minimal relief. Denies cough, sob, chest pain, regular contractions, LOF, or vaginal bleeding. Good fetal movement.  Location: left ribs Quality: cramping, discomfort Severity: 7/10 on pain scale Duration: 2 months, worse today Timing: constant Modifying factors: worse with sitting upright & lying down. Not improved with tylenol or heat Associated signs and symptoms: nausea    OB History     Gravida  3   Para  2   Term  2   Preterm      AB      Living  2      SAB      TAB      Ectopic      Multiple  0   Live Births  2           Past Medical History:  Diagnosis Date  . Anxiety   . Asthma   . Depression   . PTSD (post-traumatic stress disorder)     Past Surgical History:  Procedure Laterality Date  . FEMUR SURGERY     plate in pelvis, rods in each femur  . HIP SURGERY    . MOUTH SURGERY      Family History  Problem Relation Age of Onset  . COPD Maternal Grandfather   . Thyroid disease Mother   . Thyroid disease Maternal Grandmother   . Alcohol abuse Neg Hx   . Arthritis Neg Hx   . Asthma Neg Hx   . Birth defects Neg Hx   . Cancer Neg Hx   . Depression Neg Hx   . Diabetes Neg Hx   . Drug abuse Neg Hx   . Early death Neg Hx   . Hearing loss Neg Hx   . Heart disease Neg Hx   . Hyperlipidemia Neg Hx   . Hypertension Neg Hx   . Kidney  disease Neg Hx   . Learning disabilities Neg Hx   . Mental illness Neg Hx   . Mental retardation Neg Hx   . Miscarriages / Stillbirths Neg Hx   . Stroke Neg Hx   . Vision loss Neg Hx   . Varicose Veins Neg Hx   . Intellectual disability Neg Hx   . Obesity Neg Hx   . ADD / ADHD Neg Hx   . Anxiety disorder Neg Hx     Social History   Tobacco Use  . Smoking status: Never Smoker  . Smokeless tobacco: Former Network engineer  . Vaping Use: Never used  Substance Use Topics  . Alcohol use: Not Currently    Comment: occ  . Drug use: Not Currently    Types: Marijuana    Comment: none with pregnancy    Allergies: No Known Allergies  Medications Prior to Admission  Medication Sig Dispense Refill  Last Dose  . acetaminophen (TYLENOL) 325 MG tablet Take 650 mg by mouth every 6 (six) hours as needed.   06/26/2020 at Unknown time  . Prenatal Vit-Fe Fumarate-FA (PRENATAL VITAMINS PO) Take 1 tablet by mouth.   06/26/2020 at Unknown time  . albuterol (VENTOLIN HFA) 108 (90 Base) MCG/ACT inhaler Inhale 1-2 puffs into the lungs every 6 (six) hours as needed for wheezing or shortness of breath. 6.7 g 1   . Blood Pressure Monitoring (BLOOD PRESSURE KIT) DEVI 1 Device by Does not apply route as needed. 1 each 0   . SF 5000 PLUS 1.1 % CREA dental cream See admin instructions.       Review of Systems  Constitutional: Negative.   Respiratory: Negative.   Cardiovascular: Negative.   Gastrointestinal: Positive for abdominal pain (under left ribs) and nausea. Negative for constipation, diarrhea and vomiting.  Genitourinary: Negative.   Neurological: Negative for headaches.   Physical Exam   Blood pressure 112/70, pulse 94, temperature 98.2 F (36.8 C), temperature source Oral, last menstrual period 10/08/2019, SpO2 100 %, unknown if currently breastfeeding.  Physical Exam Vitals and nursing note reviewed.  Constitutional:      General: She is not in acute distress.    Appearance: She is  well-developed.  HENT:     Head: Normocephalic and atraumatic.  Cardiovascular:     Rate and Rhythm: Normal rate and regular rhythm.     Heart sounds: Normal heart sounds.  Pulmonary:     Effort: Pulmonary effort is normal.     Breath sounds: Normal breath sounds.  Abdominal:     Palpations: Abdomen is soft. There is no mass.     Tenderness: There is abdominal tenderness (mild TTP in LUQ). There is no guarding.     Comments: Gravid uterus. Fundal height 37 cm.   Skin:    General: Skin is warm and dry.  Neurological:     Mental Status: She is alert.    Fetal Tracing:  Baseline: 125 Variability: moderate Accelerations: 15x15 Decelerations: none  Toco: none   MAU Course  Procedures Results for orders placed or performed during the hospital encounter of 06/26/20 (from the past 24 hour(s))  Urinalysis, Routine w reflex microscopic Urine, Clean Catch     Status: Abnormal   Collection Time: 06/26/20  8:24 PM  Result Value Ref Range   Color, Urine AMBER (A) YELLOW   APPearance CLOUDY (A) CLEAR   Specific Gravity, Urine 1.023 1.005 - 1.030   pH 6.0 5.0 - 8.0   Glucose, UA NEGATIVE NEGATIVE mg/dL   Hgb urine dipstick NEGATIVE NEGATIVE   Bilirubin Urine NEGATIVE NEGATIVE   Ketones, ur NEGATIVE NEGATIVE mg/dL   Protein, ur 30 (A) NEGATIVE mg/dL   Nitrite NEGATIVE NEGATIVE   Leukocytes,Ua LARGE (A) NEGATIVE   RBC / HPF 0-5 0 - 5 RBC/hpf   WBC, UA >50 (H) 0 - 5 WBC/hpf   Bacteria, UA FEW (A) NONE SEEN   Squamous Epithelial / LPF 21-50 0 - 5   Mucus PRESENT     MDM Presents with rib pain. This has been ongoing issue. No other symptoms. Vital signs stable. Reactive nst. Discomfort likely due to increasing size of uterus & short torso. Flexeril & tylenol given in MAU. Reports resolution of pain. Will give some flexeril to have at home.   Assessment and Plan   1. Rib pain on left side  -no emergent issues identified in MAU. Pain resolved with tylenol & flexeril -Rx  flexeril  prn -pt to follow up with office regarding FMLA paperwork. Can reschedule Ob appointment for next week, will call office in the morning  2. [redacted] weeks gestation of pregnancy      Jorje Guild 06/26/2020, 9:01 PM

## 2020-06-26 NOTE — Discharge Instructions (Signed)
Signs and Symptoms of Labor Labor is your body's natural process of moving your baby, placenta, and umbilical cord out of your uterus. The process of labor usually starts when your baby is full-term, between 37 and 40 weeks of pregnancy. How will I know when I am close to going into labor? As your body prepares for labor and the birth of your baby, you may notice the following symptoms in the weeks and days before true labor starts:  Having a strong desire to get your home ready to receive your new baby. This is called nesting. Nesting may be a sign that labor is approaching, and it may occur several weeks before birth. Nesting may involve cleaning and organizing your home.  Passing a small amount of thick, bloody mucus out of your vagina (normal bloody show or losing your mucus plug). This may happen more than a week before labor begins, or it might occur right before labor begins as the opening of the cervix starts to widen (dilate). For some women, the entire mucus plug passes at once. For others, smaller portions of the mucus plug may gradually pass over several days.  Your baby moving (dropping) lower in your pelvis to get into position for birth (lightening). When this happens, you may feel more pressure on your bladder and pelvic bone and less pressure on your ribs. This may make it easier to breathe. It may also cause you to need to urinate more often and have problems with bowel movements.  Having "practice contractions" (Braxton Hicks contractions) that occur at irregular (unevenly spaced) intervals that are more than 10 minutes apart. This is also called false labor. False labor contractions are common after exercise or sexual activity, and they will stop if you change position, rest, or drink fluids. These contractions are usually mild and do not get stronger over time. They may feel like: ? A backache or back pain. ? Mild cramps, similar to menstrual cramps. ? Tightening or pressure in  your abdomen. Other early symptoms that labor may be starting soon include:  Nausea or loss of appetite.  Diarrhea.  Having a sudden burst of energy, or feeling very tired.  Mood changes.  Having trouble sleeping. How will I know when labor has begun? Signs that true labor has begun may include:  Having contractions that come at regular (evenly spaced) intervals and increase in intensity. This may feel like more intense tightening or pressure in your abdomen that moves to your back. ? Contractions may also feel like rhythmic pain in your upper thighs or back that comes and goes at regular intervals. ? For first-time mothers, this change in intensity of contractions often occurs at a more gradual pace. ? Women who have given birth before may notice a more rapid progression of contraction changes.  Having a feeling of pressure in the vaginal area.  Your water breaking (rupture of membranes). This is when the sac of fluid that surrounds your baby breaks. When this happens, you will notice fluid leaking from your vagina. This may be clear or blood-tinged. Labor usually starts within 24 hours of your water breaking, but it may take longer to begin. ? Some women notice this as a gush of fluid. ? Others notice that their underwear repeatedly becomes damp. Follow these instructions at home:   When labor starts, or if your water breaks, call your health care provider or nurse care line. Based on your situation, they will determine when you should go in for an   exam.  When you are in early labor, you may be able to rest and manage symptoms at home. Some strategies to try at home include: ? Breathing and relaxation techniques. ? Taking a warm bath or shower. ? Listening to music. ? Using a heating pad on the lower back for pain. If you are directed to use heat:  Place a towel between your skin and the heat source.  Leave the heat on for 20-30 minutes.  Remove the heat if your skin turns  bright red. This is especially important if you are unable to feel pain, heat, or cold. You may have a greater risk of getting burned. Get help right away if:  You have painful, regular contractions that are 5 minutes apart or less.  Labor starts before you are [redacted] weeks along in your pregnancy.  You have a fever.  You have a headache that does not go away.  You have bright red blood coming from your vagina.  You do not feel your baby moving.  You have a sudden onset of: ? Severe headache with vision problems. ? Nausea, vomiting, or diarrhea. ? Chest pain or shortness of breath. These symptoms may be an emergency. If your health care provider recommends that you go to the hospital or birth center where you plan to deliver, do not drive yourself. Have someone else drive you, or call emergency services (911 in the [place] Summary  Labor is your body's natural process of moving your baby, placenta, and umbilical cord out of your uterus.  The process of labor usually starts when your baby is full-term, between 37 and 40 weeks of pregnancy.  When labor starts, or if your water breaks, call your health care provider or nurse care line. Based on your situation, they will determine when you should go in for an exam. This information is not intended to replace advice given to you by your health care provider. Make sure you discuss any questions you have with your health care provider. Document Revised: 06/08/2017 Document Reviewed: 02/13/2017 Elsevier Patient Education  2020 [employer] Fetal Movement Counts Patient Name: ________________________________________________ Patient Due Date: ____________________ What is a fetal movement count?  A fetal movement count is the number of times that you feel your baby move during a certain amount of time. This may also be called a fetal kick count. A fetal movement count is recommended for every pregnant woman. You may be asked to start counting  fetal movements as early as week 28 of your pregnancy. Pay attention to when your baby is most active. You may notice your baby's sleep and wake cycles. You may also notice things that make your baby move more. You should do a fetal movement count:  When your baby is normally most active.  At the same time each day. A good time to count movements is while you are resting, after having something to eat and drink. How do I count fetal movements? 1. Find a quiet, comfortable area. Sit, or lie down on your side. 2. Write down the date, the start time and stop time, and the number of movements that you felt between those two times. Take this information with you to your health care visits. 3. Write down your start time when you feel the first movement. 4. Count kicks, flutters, swishes, rolls, and jabs. You should feel at least 10 movements. 5. You may stop counting after you have felt 10 movements, or if you have been counting for 2   hours. Write down the stop time. 6. If you do not feel 10 movements in 2 hours, contact your health care provider for further instructions. Your health care provider may want to do additional tests to assess your baby's well-being. Contact a health care provider if:  You feel fewer than 10 movements in 2 hours.  Your baby is not moving like he or she usually does. Date: ____________ Start time: ____________ Stop time: ____________ Movements: ____________ Date: ____________ Start time: ____________ Stop time: ____________ Movements: ____________ Date: ____________ Start time: ____________ Stop time: ____________ Movements: ____________ Date: ____________ Start time: ____________ Stop time: ____________ Movements: ____________ Date: ____________ Start time: ____________ Stop time: ____________ Movements: ____________ Date: ____________ Start time: ____________ Stop time: ____________ Movements: ____________ Date: ____________ Start time: ____________ Stop time:  ____________ Movements: ____________ Date: ____________ Start time: ____________ Stop time: ____________ Movements: ____________ Date: ____________ Start time: ____________ Stop time: ____________ Movements: ____________ This information is not intended to replace advice given to you by your health care provider. Make sure you discuss any questions you have with your health care provider. Document Revised: 04/28/2019 Document Reviewed: 04/28/2019 Elsevier Patient Education  2020 Elsevier Inc[employer]  

## 2020-06-26 NOTE — MAU Note (Signed)
...  Ashlee Simmons is a 24 y.o. at [redacted]w[redacted]d here in [facility]U reporting: left rib pain that she has experienced in all of her pregnancies that was increasingly painful today. Pt reports she knows it is positional and can feel the baby move but she cannot get him to move away from the spot. Pt reports she took 500 mg of Tylenol at 1400 today with no relief. Pt reports she visited MAU when she was 27 weeks for the same issue and was sent home with Flexeril but after a few times of taking it, it stopped helping and she did not have a refill. Pt reports experiencing nausea that she believes is from overwhelming herself because she feels so bad. Pt reports she has also been experiencing increased vaginal pressure at night when she lies down   +FM. No VB or LOF.  Pain score: 7/10 Lab orders placed from triage: UA

## 2020-06-26 NOTE — Telephone Encounter (Signed)
[name] called front desk and asked to speak with nurse.   She also sent MyChart message. I called [name] and we discussed she is requesting to be taken out of work due to rib pain . She is [redacted] weeks pregnant. I explained we do not take people out of work without a medical reason and I do not see that she has a documented medical reason like preterm labor, etc. I explained she can discuss this tomorrow at her next ob appointment with provider. She said they still want a note and I again explained I can't give her a note without approval from provider for a medical reason and that if she is taking FMLA she doesn't have to have a medical reason she and her employer can arrange that. She voices understanding . Keyshla Tunison,RN

## 2020-06-26 NOTE — Telephone Encounter (Signed)
Per chart review do not see that provider took her out of work at last prenatal visit.  I called [name] and was unable to leave a message- I heard a message voicemail box is full .  I will send a MyChart message to patient. Charistopher Rumble,RN

## 2020-06-27 ENCOUNTER — Encounter: Payer: Medicaid Other | Admitting: Obstetrics and Gynecology

## 2020-06-27 ENCOUNTER — Telehealth: Payer: Self-pay | Admitting: Obstetrics and Gynecology

## 2020-06-27 ENCOUNTER — Encounter: Payer: Self-pay | Admitting: Obstetrics and Gynecology

## 2020-06-27 ENCOUNTER — Ambulatory Visit (INDEPENDENT_AMBULATORY_CARE_PROVIDER_SITE_OTHER): Payer: Medicaid Other | Admitting: Obstetrics and Gynecology

## 2020-06-27 VITALS — BP 113/78 | HR 84 | Wt 193.4 lb

## 2020-06-27 DIAGNOSIS — O0990 Supervision of high risk pregnancy, unspecified, unspecified trimester: Secondary | ICD-10-CM

## 2020-06-27 DIAGNOSIS — E669 Obesity, unspecified: Secondary | ICD-10-CM

## 2020-06-27 DIAGNOSIS — R0781 Pleurodynia: Secondary | ICD-10-CM

## 2020-06-27 DIAGNOSIS — Z3A37 37 weeks gestation of pregnancy: Secondary | ICD-10-CM

## 2020-06-27 NOTE — Progress Notes (Signed)
  HIGH-RISK PREGNANCY OFFICE VISIT Patient name: Erykah Lippert MRN [id number]  Date of birth: May 12, 1996 Chief Complaint:   Routine Prenatal Visit  History of Present Illness:   Ashlee Simmons is a 24 y.o. G13P2002 female at [redacted]w[redacted]d with an Estimated Date of Delivery: 07/14/20 being seen today for ongoing management of a high-risk pregnancy complicated by Hypothyroidism, Obesity, PTSD, and THC use Today she reports occasional contractions. Contractions: Irregular. Vag. Bleeding: None.  Movement: Present. denies leaking of fluid.  Review of Systems:   Pertinent items are noted in HPI Denies abnormal vaginal discharge w/ itching/odor/irritation, headaches, visual changes, shortness of breath, chest pain, abdominal pain, severe nausea/vomiting, or problems with urination or bowel movements unless otherwise stated above. Pertinent History Reviewed:  Reviewed past medical,surgical, social, obstetrical and family history.  Reviewed problem list, medications and allergies. Physical Assessment:   Vitals:   06/27/20 1420  BP: 113/78  Pulse: 84  Weight: 193 lb 6.4 oz (87.7 kg)  Body mass index is 35.37 kg/m.           Physical Examination:   General appearance: alert, well appearing, and in no distress, oriented to person, place, and time and overweight  Mental status: alert, oriented to person, place, and time, normal mood, behavior, speech, dress, motor activity, and thought processes  Skin: warm & dry   Extremities: Edema: Trace    Cardiovascular: normal heart rate noted  Respiratory: normal respiratory effort, no distress  Abdomen: gravid, soft, non-tender  Pelvic: Cervical exam deferred         Fetal Status: Fetal Heart Rate (bpm): 135 Fundal Height: 38 cm Movement: Present    Fetal Surveillance Testing today: none   Assessment & Plan:  1) High-risk pregnancy G3P2002 at [redacted]w[redacted]d with an Estimated Date of Delivery: 07/14/20   2) Supervision of high risk pregnancy, antepartum -  Anticipatory guidance for labor  3) Rib pain on left side - Has Rx for Flexeril, but has not picked it up yet - Advised to take as prescribed to relieve rib pain - Informed that baby is laying all on her LT side where her rib pain is - FMLA papers to front office staff - ok to be OOW from today through until after postpartum visit  4) Obesity (BMI 30-39.9)  5) [redacted] weeks gestation of pregnancy    Meds: No orders of the defined types were placed in this encounter.   Labs/procedures today: none  Treatment Plan:    Reviewed: Term labor symptoms and general obstetric precautions including but not limited to vaginal bleeding, contractions, leaking of fluid and fetal movement were reviewed in detail with the patient.  All questions were answered.   Follow-up: Return in about 1 week (around 07/04/2020) for Return OB visit.  No orders of the defined types were placed in this encounter.  Raelyn Mora MSN, CNM 06/27/2020 2:42 PM

## 2020-06-27 NOTE — Telephone Encounter (Signed)
Attempted to reach patient to speak to her about her FMLA papers. Was not able to leave a message.

## 2020-07-05 ENCOUNTER — Encounter: Payer: Medicaid Other | Admitting: Obstetrics and Gynecology

## 2020-07-05 ENCOUNTER — Ambulatory Visit (INDEPENDENT_AMBULATORY_CARE_PROVIDER_SITE_OTHER): Payer: Medicaid Other | Admitting: Obstetrics and Gynecology

## 2020-07-05 ENCOUNTER — Other Ambulatory Visit: Payer: Self-pay

## 2020-07-05 VITALS — BP 109/71 | HR 87 | Wt 193.1 lb

## 2020-07-05 DIAGNOSIS — F410 Panic disorder [episodic paroxysmal anxiety] without agoraphobia: Secondary | ICD-10-CM

## 2020-07-05 DIAGNOSIS — Z3A38 38 weeks gestation of pregnancy: Secondary | ICD-10-CM | POA: Insufficient documentation

## 2020-07-05 DIAGNOSIS — O0990 Supervision of high risk pregnancy, unspecified, unspecified trimester: Secondary | ICD-10-CM

## 2020-07-05 NOTE — Progress Notes (Signed)
IOL faxed to L&D.  Received successful transmission.  Pt advised that someone from L&D will call with time.    Addison Naegeli, RN  07/05/20

## 2020-07-05 NOTE — Progress Notes (Signed)
Prenatal Visit Note Date: 07/05/2020 Clinic: [facility]  Subjective:  Ashlee Simmons is a 24 y.o. G3P2002 at [redacted]w[redacted]d being seen today for ongoing prenatal care.  She is currently monitored for the following issues for this high-risk pregnancy and has Panic attacks; Supervision of high risk pregnancy, antepartum; PTSD (post-traumatic stress disorder); Anxiety; Depression; Asthma; Obesity (BMI 30-39.9); History of femur fracture; History of marijuana use; Hypothyroidism; Urinary retention with incomplete bladder emptying; and [redacted] weeks gestation of pregnancy on their problem list.  Patient reports more frequent panic attacks. pt unsure why b/c she states things at home are well..   Contractions: Irritability. Vag. Bleeding: None.  Movement: Present. Denies leaking of fluid.   The following portions of the patient's history were reviewed and updated as appropriate: allergies, current medications, past family history, past medical history, past social history, past surgical history and problem list. Problem list updated.  Objective:   Vitals:   07/05/20 1334  BP: 109/71  Pulse: 87  Weight: 193 lb 1.6 oz (87.6 kg)    Fetal Status: Fetal Heart Rate (bpm): 140 Fundal Height: 38 cm Movement: Present  Presentation: Vertex  General:  Alert, oriented and cooperative. Patient is in no acute distress.  Skin: Skin is warm and dry. No rash noted.   Cardiovascular: Normal heart rate noted  Respiratory: Normal respiratory effort, no problems with respiration noted  Abdomen: Soft, gravid, appropriate for gestational age. Pain/Pressure: Present     Pelvic:  Cervical exam deferred        Extremities: Normal range of motion.  Edema: Trace  Mental Status: Normal mood and affect. Normal behavior. Normal judgment and thought content.   Urinalysis:      Assessment and Plan:  Pregnancy: G3P2002 at [redacted]w[redacted]d  1. Supervision of high risk pregnancy, antepartum Routine care. Set up for post  dates IOL  2. [redacted] weeks gestation of pregnancy  3. Panic attacks D/w her re: behavioral interventions to try and help.  Term labor symptoms and general obstetric precautions including but not limited to vaginal bleeding, contractions, leaking of fluid and fetal movement were reviewed in detail with the patient. Please refer to After Visit Summary for other counseling recommendations.  Return in about 1 week (around 07/12/2020) for in person, high risk, low risk.   Jacumba Bing, MD

## 2020-07-06 ENCOUNTER — Encounter: Payer: Medicaid Other | Admitting: Family Medicine

## 2020-07-12 ENCOUNTER — Ambulatory Visit (INDEPENDENT_AMBULATORY_CARE_PROVIDER_SITE_OTHER): Payer: Medicaid Other | Admitting: Student

## 2020-07-12 ENCOUNTER — Other Ambulatory Visit: Payer: Self-pay

## 2020-07-12 VITALS — BP 114/77 | HR 92 | Wt 193.5 lb

## 2020-07-12 DIAGNOSIS — O0990 Supervision of high risk pregnancy, unspecified, unspecified trimester: Secondary | ICD-10-CM

## 2020-07-12 DIAGNOSIS — Z87898 Personal history of other specified conditions: Secondary | ICD-10-CM

## 2020-07-12 DIAGNOSIS — F419 Anxiety disorder, unspecified: Secondary | ICD-10-CM

## 2020-07-12 DIAGNOSIS — O99340 Other mental disorders complicating pregnancy, unspecified trimester: Secondary | ICD-10-CM

## 2020-07-12 DIAGNOSIS — J45909 Unspecified asthma, uncomplicated: Secondary | ICD-10-CM

## 2020-07-12 DIAGNOSIS — F1291 Cannabis use, unspecified, in remission: Secondary | ICD-10-CM

## 2020-07-12 DIAGNOSIS — F32A Depression, unspecified: Secondary | ICD-10-CM

## 2020-07-12 DIAGNOSIS — E038 Other specified hypothyroidism: Secondary | ICD-10-CM

## 2020-07-12 DIAGNOSIS — Z3A39 39 weeks gestation of pregnancy: Secondary | ICD-10-CM

## 2020-07-12 DIAGNOSIS — Z8781 Personal history of (healed) traumatic fracture: Secondary | ICD-10-CM

## 2020-07-12 DIAGNOSIS — F4310 Post-traumatic stress disorder, unspecified: Secondary | ICD-10-CM

## 2020-07-12 NOTE — Progress Notes (Signed)
     PRENATAL VISIT NOTE  Subjective:  Ashlee Simmons is a 24 y.o. G3P2002 at [redacted]w[redacted]d being seen today for ongoing prenatal care.  She is currently monitored for the following issues for this low-risk pregnancy and has Panic attacks; Supervision of high risk pregnancy, antepartum; PTSD (post-traumatic stress disorder); Anxiety; Depression; Asthma; Obesity (BMI 30-39.9); History of femur fracture; History of marijuana use; Hypothyroidism; Urinary retention with incomplete bladder emptying; and [redacted] weeks gestation of pregnancy on their problem list.  Patient reports some strong period cramps. She also has some increased in watery discharge, that comes up in little spots. .  Contractions: Irritability. Vag. Bleeding: None.  Movement: Present. Denies leaking of fluid.   The following portions of the patient's history were reviewed and updated as appropriate: allergies, current medications, past family history, past medical history, past social history, past surgical history and problem list.   Objective:   Vitals:   07/12/20 1045  BP: 114/77  Pulse: 92  Weight: 193 lb 8 oz (87.8 kg)    Fetal Status: Fetal Heart Rate (bpm): 150   Movement: Present  Presentation: Vertex  General:  Alert, oriented and cooperative. Patient is in no acute distress.  Skin: Skin is warm and dry. No rash noted.   Cardiovascular: Normal heart rate noted  Respiratory: Normal respiratory effort, no problems with respiration noted  Abdomen: Soft, gravid, appropriate for gestational age.  Pain/Pressure: Present     Pelvic: Cervical exam performed in the presence of a chaperone Dilation: 1 Effacement (%): 50 Station: Ballotable Sterile speculum exam: no pooling, white mucous in the vagina  Extremities: Normal range of motion.  Edema: Trace  Mental Status: Normal mood and affect. Normal behavior. Normal judgment and thought content.   Assessment and Plan:  Pregnancy: G3P2002 at [redacted]w[redacted]d 1. Supervision of high risk  pregnancy -Sterile slide negative for ferning and no pooling -Membrane sweep today -Reviewed signs of labor -Discussed FB; patient is an excellent FB candidate; will have patient go to MAU next Friday night for FB placement. Information given to patient on FB (patient has had FB in the past) -NST/BPP next week due to postdates   Term labor symptoms and general obstetric precautions including but not limited to vaginal bleeding, contractions, leaking of fluid and fetal movement were reviewed in detail with the patient. Please refer to After Visit Summary for other counseling recommendations.   Return in about 1 week (around 07/19/2020), or NST/BPP next week.  Future Appointments  Date Time Provider Department Center  07/19/2020 10:15 AM [facility] NST [facility]  07/21/2020  8:40 AM [facility] SCHED ROOM [facility] None    Marylene Land, CNM

## 2020-07-12 NOTE — Patient Instructions (Signed)

## 2020-07-13 ENCOUNTER — Telehealth: Payer: Self-pay | Admitting: Family Medicine

## 2020-07-13 ENCOUNTER — Ambulatory Visit: Payer: Self-pay | Admitting: *Deleted

## 2020-07-13 NOTE — Telephone Encounter (Signed)
Patient had membranes stripped yesterday by provider and had mucus discharge today. No contractions on fluid leaking out at this time. Probable mucus plug. Patient advised per protocol and will contact OB/GYN for changes.  Reason for Disposition . [1] Pregnant 37 or more weeks (term) AND [2] passed a small glob or chunk of mucous (may look like gelatin or snot)  Answer Assessment - Initial Assessment Questions 1. DISCHARGE: "Describe the discharge." (e.g., white, yellow, green, gray, foamy, cottage cheese-like)     Mucus discharge after membrane stripping 2. ODOR: "Is there a bad odor?"     Had odor- mucus type odor 3. ONSET: "When did the discharge begin?"     This morning 4. RASH: "Is there a rash in that area?" If Yes, ask: "Describe it." (e.g., redness, blisters, sores, bumps)     after wiped had a little itching- now just supper moist 5. ABDOMINAL PAIN: "Are you having any abdominal pain?" If Yes, ask: "What does it feel like?" (e.g., crampy, dull, intermittent, constant)      no 6. ABDOMINAL PAIN SEVERITY: If present, ask: "How bad is it?"  (e.g., Scale 1-10; mild, moderate, or severe)   - MILD (1-3): doesn't interfere with normal activities, abdomen soft and not tender to touch    - MODERATE (4-7): interferes with normal activities or awakens from sleep, tender to touch    - SEVERE (8-10): excruciating pain, doubled over, unable to do any normal activities     no 7. CAUSE: "What do you think is causing the discharge?"    Mucus plug 8. OTHER SYMPTOMS: "Do you have any other symptoms?" (e.g., fever, itching, vaginal bleeding, pain with urination)     no 9. EDD: "What date are you expecting to deliver?"      07/14/20 10. PREGNANCY: "How many weeks pregnant are you?"       39.6  Protocols used: PREGNANCY - VAGINAL DISCHARGE-A-AH

## 2020-07-13 NOTE — Telephone Encounter (Signed)
Patient want to speak to someone, what to know if she lost her mucus plug. Requesting a call back today

## 2020-07-15 ENCOUNTER — Encounter (HOSPITAL_COMMUNITY): Payer: Self-pay | Admitting: Obstetrics and Gynecology

## 2020-07-15 ENCOUNTER — Other Ambulatory Visit: Payer: Self-pay

## 2020-07-15 ENCOUNTER — Inpatient Hospital Stay (HOSPITAL_COMMUNITY): Payer: Medicaid Other | Admitting: Anesthesiology

## 2020-07-15 ENCOUNTER — Inpatient Hospital Stay (HOSPITAL_COMMUNITY)
Admission: AD | Admit: 2020-07-15 | Discharge: 2020-07-18 | DRG: 807 | Disposition: A | Discharge status: Home / Self Care | Payer: Medicaid Other | Attending: Obstetrics and Gynecology | Admitting: Obstetrics and Gynecology

## 2020-07-15 DIAGNOSIS — R339 Retention of urine, unspecified: Secondary | ICD-10-CM

## 2020-07-15 DIAGNOSIS — F419 Anxiety disorder, unspecified: Secondary | ICD-10-CM

## 2020-07-15 DIAGNOSIS — Z3A40 40 weeks gestation of pregnancy: Secondary | ICD-10-CM

## 2020-07-15 DIAGNOSIS — Z8781 Personal history of (healed) traumatic fracture: Secondary | ICD-10-CM

## 2020-07-15 DIAGNOSIS — E038 Other specified hypothyroidism: Secondary | ICD-10-CM

## 2020-07-15 DIAGNOSIS — J45909 Unspecified asthma, uncomplicated: Secondary | ICD-10-CM | POA: Diagnosis present

## 2020-07-15 DIAGNOSIS — Z87898 Personal history of other specified conditions: Secondary | ICD-10-CM

## 2020-07-15 DIAGNOSIS — F32A Depression, unspecified: Secondary | ICD-10-CM

## 2020-07-15 DIAGNOSIS — Z20822 Contact with and (suspected) exposure to covid-19: Secondary | ICD-10-CM | POA: Diagnosis present

## 2020-07-15 DIAGNOSIS — O99214 Obesity complicating childbirth: Secondary | ICD-10-CM | POA: Diagnosis present

## 2020-07-15 DIAGNOSIS — O26893 Other specified pregnancy related conditions, third trimester: Secondary | ICD-10-CM | POA: Diagnosis present

## 2020-07-15 DIAGNOSIS — O9952 Diseases of the respiratory system complicating childbirth: Secondary | ICD-10-CM | POA: Diagnosis present

## 2020-07-15 DIAGNOSIS — O0990 Supervision of high risk pregnancy, unspecified, unspecified trimester: Secondary | ICD-10-CM

## 2020-07-15 DIAGNOSIS — E669 Obesity, unspecified: Secondary | ICD-10-CM | POA: Diagnosis present

## 2020-07-15 DIAGNOSIS — Z0371 Encounter for suspected problem with amniotic cavity and membrane ruled out: Secondary | ICD-10-CM

## 2020-07-15 DIAGNOSIS — O368390 Maternal care for abnormalities of the fetal heart rate or rhythm, unspecified trimester, not applicable or unspecified: Secondary | ICD-10-CM

## 2020-07-15 DIAGNOSIS — F4310 Post-traumatic stress disorder, unspecified: Secondary | ICD-10-CM

## 2020-07-15 DIAGNOSIS — O480 Post-term pregnancy: Secondary | ICD-10-CM | POA: Diagnosis not present

## 2020-07-15 DIAGNOSIS — F1291 Cannabis use, unspecified, in remission: Secondary | ICD-10-CM

## 2020-07-15 LAB — CBC
HCT: 36.3 % (ref 36.0–46.0)
Hemoglobin: 12.1 g/dL (ref 12.0–15.0)
MCH: 28.9 pg (ref 26.0–34.0)
MCHC: 33.3 g/dL (ref 30.0–36.0)
MCV: 86.8 fL (ref 80.0–100.0)
Platelets: 101 10*3/uL — ABNORMAL LOW (ref 150–400)
RBC: 4.18 MIL/uL (ref 3.87–5.11)
RDW: 13.2 % (ref 11.5–15.5)
WBC: 12 10*3/uL — ABNORMAL HIGH (ref 4.0–10.5)
nRBC: 0 % (ref 0.0–0.2)

## 2020-07-15 LAB — POCT FERN TEST: POCT Fern Test: NEGATIVE

## 2020-07-15 LAB — RESPIRATORY PANEL BY RT PCR (FLU A&B, COVID)
Influenza A by PCR: NEGATIVE
Influenza B by PCR: NEGATIVE
SARS Coronavirus 2 by RT PCR: NEGATIVE

## 2020-07-15 LAB — TYPE AND SCREEN
ABO/RH(D): O POS
Antibody Screen: NEGATIVE

## 2020-07-15 MED ORDER — SOD CITRATE-CITRIC ACID 500-334 MG/5ML PO SOLN: 30.0000 mL | ORAL | Status: DC | PRN

## 2020-07-15 MED ORDER — HYDROXYZINE HCL 50 MG PO TABS: 50.0000 mg | ORAL_TABLET | Freq: Four times a day (QID) | ORAL | Status: DC | PRN

## 2020-07-15 MED ORDER — FENTANYL-BUPIVACAINE-NACL 0.5-0.125-0.9 MG/250ML-% EP SOLN: 12.0000 mL/h | EPIDURAL | Status: DC | PRN

## 2020-07-15 MED ORDER — LACTATED RINGERS IV SOLN
500.0000 mL | Freq: Once | INTRAVENOUS | Status: AC
  Administered 2020-07-15: 500 mL via INTRAVENOUS

## 2020-07-15 MED ORDER — PHENYLEPHRINE 40 MCG/ML (10ML) SYRINGE FOR IV PUSH (FOR BLOOD PRESSURE SUPPORT): 80.0000 ug | PREFILLED_SYRINGE | INTRAVENOUS | Status: DC | PRN

## 2020-07-15 MED ORDER — TERBUTALINE SULFATE 1 MG/ML IJ SOLN: 0.2500 mg | Freq: Once | INTRAMUSCULAR | Status: DC | PRN

## 2020-07-15 MED ORDER — FENTANYL CITRATE (PF) 100 MCG/2ML IJ SOLN: 100.0000 ug | Freq: Once | INTRAMUSCULAR | Status: DC

## 2020-07-15 MED ORDER — FENTANYL-BUPIVACAINE-NACL 0.5-0.125-0.9 MG/250ML-% EP SOLN
EPIDURAL | Status: AC
Start: 2020-07-15 — End: 2020-07-16
  Filled 2020-07-15: qty 250

## 2020-07-15 MED ORDER — LIDOCAINE HCL (PF) 1 % IJ SOLN
INTRAMUSCULAR | Status: DC | PRN
  Administered 2020-07-15: 2 mL via EPIDURAL
  Administered 2020-07-15: 10 mL via EPIDURAL

## 2020-07-15 MED ORDER — FENTANYL CITRATE (PF) 100 MCG/2ML IJ SOLN
50.0000 ug | INTRAMUSCULAR | Status: DC | PRN
  Administered 2020-07-15: 50 ug via INTRAVENOUS
  Filled 2020-07-15 (×2): qty 2

## 2020-07-15 MED ORDER — FENTANYL CITRATE (PF) 100 MCG/2ML IJ SOLN
100.0000 ug | Freq: Once | INTRAMUSCULAR | Status: AC
  Administered 2020-07-15: 100 ug via INTRAVENOUS

## 2020-07-15 MED ORDER — LIDOCAINE HCL (PF) 1 % IJ SOLN: 30.0000 mL | INTRAMUSCULAR | Status: DC | PRN

## 2020-07-15 MED ORDER — DIPHENHYDRAMINE HCL 50 MG/ML IJ SOLN: 12.5000 mg | INTRAMUSCULAR | Status: DC | PRN

## 2020-07-15 MED ORDER — ONDANSETRON HCL 4 MG/2ML IJ SOLN: 4.0000 mg | Freq: Four times a day (QID) | INTRAMUSCULAR | Status: DC | PRN

## 2020-07-15 MED ORDER — ACETAMINOPHEN 325 MG PO TABS
650.0000 mg | ORAL_TABLET | ORAL | Status: DC | PRN
  Administered 2020-07-16: 650 mg via ORAL
  Filled 2020-07-15: qty 2

## 2020-07-15 MED ORDER — LACTATED RINGERS IV SOLN: INTRAVENOUS | Status: DC

## 2020-07-15 MED ORDER — LACTATED RINGERS IV SOLN: 500.0000 mL | INTRAVENOUS | Status: DC | PRN

## 2020-07-15 MED ORDER — SODIUM CHLORIDE (PF) 0.9 % IJ SOLN
INTRAMUSCULAR | Status: DC | PRN
  Administered 2020-07-15: 12 mL/h via EPIDURAL

## 2020-07-15 MED ORDER — EPHEDRINE 5 MG/ML INJ: 10.0000 mg | INTRAVENOUS | Status: DC | PRN

## 2020-07-15 MED ORDER — MISOPROSTOL 25 MCG QUARTER TABLET: 25.0000 ug | ORAL_TABLET | ORAL | Status: DC | PRN

## 2020-07-15 MED ORDER — OXYTOCIN-SODIUM CHLORIDE 30-0.9 UT/500ML-% IV SOLN
2.5000 [IU]/h | INTRAVENOUS | Status: DC
  Filled 2020-07-15: qty 500

## 2020-07-15 MED ORDER — OXYTOCIN BOLUS FROM INFUSION
333.0000 mL | Freq: Once | INTRAVENOUS | Status: AC
  Administered 2020-07-16: 333 mL via INTRAVENOUS

## 2020-07-15 MED ORDER — PHENYLEPHRINE 40 MCG/ML (10ML) SYRINGE FOR IV PUSH (FOR BLOOD PRESSURE SUPPORT)
80.0000 ug | PREFILLED_SYRINGE | INTRAVENOUS | Status: DC | PRN
  Filled 2020-07-15: qty 10

## 2020-07-15 NOTE — MAU Note (Signed)
Ashlee Simmons is a 24 y.o. at [redacted]w[redacted]d here in MAU reporting: saw a large wet spot when she woke up, it seemed clear. Denies bleeding. Having irregular contractions. +FM  Onset of complaint: today  Pain score: 4/10  Vitals:   07/15/20 1431  BP: 106/64  Pulse: (!) 103  Resp: 16  Temp: 99.1 F (37.3 C)  SpO2: 100%     FHT: +FM, EFM applied in room  Lab orders placed from triage: fern slide

## 2020-07-15 NOTE — MAU Provider Note (Signed)
Chief Complaint  Patient presents with  . Rupture of Membranes     First Provider Initiated Contact with Patient 07/15/20 1534      S: Ashlee Simmons  is a 24 y.o. y.o. year old G47P2002 female at [redacted]w[redacted]d weeks gestation who presents to MAU reporting leaking a small amount since last night and and noticing a large damp spot on sheets this morning. Also having contractions that have getting stronger since arriving in MAU.   Contractions: moderate, back pain Vaginal bleeding: Denies Fetal movement: Nml  O:  Patient Vitals for the past 24 hrs:  BP Temp Temp src Pulse Resp SpO2  07/15/20 1820 127/75 98.2 F (36.8 C) Oral 90 17 --  07/15/20 1650 125/78 -- -- 100 17 --  07/15/20 1610 130/84 -- -- 95 17 --  07/15/20 1431 106/64 99.1 F (37.3 C) Oral (!) 103 16 100 %   General: NAD Heart: Regular rate Lungs: Normal rate and effort Abd: Soft, NT, Gravid, S=D Pelvic: NEFG, Neg pooling, no blood. Moderate amount of yellow mucus  Dilation: 4 Effacement (%): 50 Presentation: Vertex Exam by:: [place] CNM  EFM: 140, Moderate variability, 15 x 15 accelerations, no decelerations Toco: Q3 minutes, moderate  Neg Fern  MAU Course/MDM - No evidence of SROM-->Neg pooling and fern. Exam C/W passage of mucus plug - Cervical change from 4 to 5.5 - Variable decel that resolved w/ position change.   A: [redacted]w[redacted]d week IUP 1. Labor and delivery indication for care or intervention   2. Antepartum variable deceleration   3. No leakage of amniotic fluid into vagina    P: Admit to L&D    Katrinka Blazing [place], CNM 07/15/2020 4:04 PM  2

## 2020-07-15 NOTE — H&P (Addendum)
OBSTETRIC ADMISSION HISTORY AND PHYSICAL  Ashlee Simmons is a 24 y.o. female [id number] with IUP at [redacted]w[redacted]d by LMP presenting for SOL. She reports +FMs, No LOF, no VB, no blurry vision, headaches or peripheral edema, and RUQ pain.  She plans on breast/bottle feeding. She request BTL for birth control and has already signed and consented for this. She received her prenatal care at [facility]   Dating: By LMP/18wk [place] --->  Estimated Date of Delivery: 07/14/20  Sono:    '@[redacted]w[redacted]d'$ , CWD, normal anatomy, cephalic presentation, fetal lie, 548g, 54% EFW   Prenatal History/Complications:  Patient Active Problem List   Diagnosis Date Noted  . Supervision of high-risk pregnancy 07/15/2020  . [redacted] weeks gestation of pregnancy 07/05/2020  . Urinary retention with incomplete bladder emptying 02/02/2020  . History of femur fracture 01/02/2020  . History of marijuana use 01/02/2020  . Hypothyroidism   . Obesity (BMI 30-39.9) 12/15/2019  . Supervision of high risk pregnancy, antepartum 12/08/2019  . PTSD (post-traumatic stress disorder)   . Anxiety   . Depression   . Asthma   . Panic attacks 01/26/2016     Past Medical History: Past Medical History:  Diagnosis Date  . Anxiety   . Asthma   . Depression   . PTSD (post-traumatic stress disorder)     Past Surgical History: Past Surgical History:  Procedure Laterality Date  . FEMUR SURGERY     plate in pelvis, rods in each femur  . HIP SURGERY    . MOUTH SURGERY      Obstetrical History: OB History    Gravida  3   Para  2   Term  2   Preterm      AB      Living  2     SAB      TAB      Ectopic      Multiple  0   Live Births  2           Social History Social History   Socioeconomic History  . Marital status: Single    Spouse name: Not on file  . Number of children: Not on file  . Years of education: Not on file  . Highest education level: Not on file  Occupational History  . Not on file  Tobacco Use  . Smoking  status: Never Smoker  . Smokeless tobacco: Former Network engineer  . Vaping Use: Never used  Substance and Sexual Activity  . Alcohol use: Not Currently    Comment: occ  . Drug use: Not Currently    Types: Marijuana    Comment: none with pregnancy  . Sexual activity: Yes    Birth control/protection: None  Other Topics Concern  . Not on file  Social History Narrative  . Not on file   Social Determinants of Health   Financial Resource Strain:   . Difficulty of Paying Living Expenses: Not on file  Food Insecurity: No Food Insecurity  . Worried About Charity fundraiser in the Last Year: Never true  . Ran Out of Food in the Last Year: Never true  Transportation Needs: No Transportation Needs  . Lack of Transportation (Medical): No  . Lack of Transportation (Non-Medical): No  Physical Activity:   . Days of Exercise per Week: Not on file  . Minutes of Exercise per Session: Not on file  Stress:   . Feeling of Stress : Not on file  Social Connections:   .  Frequency of Communication with Friends and Family: Not on file  . Frequency of Social Gatherings with Friends and Family: Not on file  . Attends Religious Services: Not on file  . Active Member of Clubs or Organizations: Not on file  . Attends Archivist Meetings: Not on file  . Marital Status: Not on file    Family History: Family History  Problem Relation Age of Onset  . COPD Maternal Grandfather   . Thyroid disease Mother   . Thyroid disease Maternal Grandmother   . Alcohol abuse Neg Hx   . Arthritis Neg Hx   . Asthma Neg Hx   . Birth defects Neg Hx   . Cancer Neg Hx   . Depression Neg Hx   . Diabetes Neg Hx   . Drug abuse Neg Hx   . Early death Neg Hx   . Hearing loss Neg Hx   . Heart disease Neg Hx   . Hyperlipidemia Neg Hx   . Hypertension Neg Hx   . Kidney disease Neg Hx   . Learning disabilities Neg Hx   . Mental illness Neg Hx   . Mental retardation Neg Hx   . Miscarriages / Stillbirths  Neg Hx   . Stroke Neg Hx   . Vision loss Neg Hx   . Varicose Veins Neg Hx   . Intellectual disability Neg Hx   . Obesity Neg Hx   . ADD / ADHD Neg Hx   . Anxiety disorder Neg Hx     Allergies: No Known Allergies  Medications Prior to Admission  Medication Sig Dispense Refill Last Dose  . acetaminophen (TYLENOL) 325 MG tablet Take 650 mg by mouth every 6 (six) hours as needed.   07/14/2020 at Unknown time  . Blood Pressure Monitoring (BLOOD PRESSURE KIT) DEVI 1 Device by Does not apply route as needed. 1 each 0 Past Week at Unknown time  . cyclobenzaprine (FLEXERIL) 5 MG tablet Take 1 tablet (5 mg total) by mouth 3 (three) times daily as needed for muscle spasms. 20 tablet 0 07/14/2020 at Unknown time  . Prenatal Vit-Fe Fumarate-FA (PRENATAL VITAMINS PO) Take 1 tablet by mouth.   07/15/2020 at Unknown time  . albuterol (VENTOLIN HFA) 108 (90 Base) MCG/ACT inhaler Inhale 1-2 puffs into the lungs every 6 (six) hours as needed for wheezing or shortness of breath. 6.7 g 1 More than a month at Unknown time     Review of Systems   All systems reviewed and negative except as stated in HPI  Blood pressure (!) 98/57, pulse (!) 110, temperature 98.9 F (37.2 C), temperature source Oral, resp. rate 18, height $RemoveBe'5\' 2"'kkKNrLOKR$  (1.575 m), weight 88.5 kg, last menstrual period 10/08/2019, SpO2 100 %, unknown if currently breastfeeding. General appearance: alert and no distress Heart: regular rate and rhythm Abdomen: soft, non-tender; bowel sounds normal Extremities: Homans sign is negative, no sign of DVT Presentation: cephalic Fetal monitoringBaseline: 140 bpm and Variability: Good {> 6 bpm) Uterine activityFrequency: Every 2-5 minutes Dilation: 5.5 Effacement (%): 60 Station: -2 Exam by:: Cassie Freer, RN   Prenatal labs: ABO, Rh: --/--/O POS (10/24 2019) Antibody: NEG (10/24 2019) Rubella: 2.61 (05/13 1722) RPR: Non Reactive (08/18 0935)  HBsAg: Negative (05/13 1722)  HIV: Non Reactive  (08/18 0935)  GBS: Negative/-- (09/29 1636)  1 hr Glucola: 79, 134,109 Genetic screening  AFP neg Anatomy US normal  Prenatal Transfer Tool  Maternal Diabetes: No Genetic Screening: Normal Maternal Ultrasounds/Referrals: Normal Fetal Ultrasounds or  other Referrals:  None Maternal Substance Abuse:  No Significant Maternal Medications:  None Significant Maternal Lab Results: None  Results for orders placed or performed during the hospital encounter of 07/15/20 (from the past 24 hour(s))  POCT fern test   Collection Time: 07/15/20  6:16 PM  Result Value Ref Range   POCT Fern Test Negative = intact amniotic membranes   Respiratory Panel by RT PCR (Flu A&B, Covid) - Nasopharyngeal Swab   Collection Time: 07/15/20  8:19 PM   Specimen: Nasopharyngeal Swab  Result Value Ref Range   SARS Coronavirus 2 by RT PCR NEGATIVE NEGATIVE   Influenza A by PCR NEGATIVE NEGATIVE   Influenza B by PCR NEGATIVE NEGATIVE  Type and screen   Collection Time: 07/15/20  8:19 PM  Result Value Ref Range   ABO/RH(D) O POS    Antibody Screen NEG    Sample Expiration      07/18/2020,2359 Performed at [facility], [place] [address], [place], [place] [address]   CBC   Collection Time: 07/15/20  8:28 PM  Result Value Ref Range   WBC 12.0 (H) 4.0 - 10.5 K/uL   RBC 4.18 3.87 - 5.11 MIL/uL   Hemoglobin 12.1 12.0 - 15.0 g/dL   HCT 36.3 36 - 46 %   MCV 86.8 80.0 - 100.0 fL   MCH 28.9 26.0 - 34.0 pg   MCHC 33.3 30.0 - 36.0 g/dL   RDW 13.2 11.5 - 15.5 %   Platelets 101 (L) 150 - 400 K/uL   nRBC 0.0 0.0 - 0.2 %    Patient Active Problem List   Diagnosis Date Noted  . Supervision of high-risk pregnancy 07/15/2020  . [redacted] weeks gestation of pregnancy 07/05/2020  . Urinary retention with incomplete bladder emptying 02/02/2020  . History of femur fracture 01/02/2020  . History of marijuana use 01/02/2020  . Hypothyroidism   . Obesity (BMI 30-39.9) 12/15/2019  . Supervision of high risk pregnancy,  antepartum 12/08/2019  . PTSD (post-traumatic stress disorder)   . Anxiety   . Depression   . Asthma   . Panic attacks 01/26/2016    Assessment/Plan:  Jaritza Duignan is a 24 y.o. G3P2002 at [redacted]w[redacted]d here for SOL  #Labor:SOL with intact membranes. Continue expectant management. #Pain: Epidural #FWB: Cat 1 #ID:  GBS Neg #MOF: Breast/bottle #MOC:BTL #Circ:  Yes  Baldo Ash, MD  07/15/2020, 11:44 PM  Attestation of Supervision of resident:  I confirm that I have verified the information documented in the resident's note and that I have also personally reperformed the history, physical exam and all medical decision making activities.  I have verified that all services and findings are accurately documented in this student's note; and I agree with management and plan as outlined in the documentation. I have also made any necessary editorial changes.  Discussed with patient wants and desires for labor and delivery experience  Patient comfortable at this time with epidural will reassess in 2 hours for need for augmentation such as [place], [place] for [employer], [facility] [facility] 07/15/2020 11:59 PM

## 2020-07-15 NOTE — Anesthesia Procedure Notes (Signed)
Epidural Patient location during procedure: OB Start time: 07/15/2020 9:24 PM End time: 07/15/2020 10:08 PM  Staffing Anesthesiologist: Lannie Fields, DO Performed: anesthesiologist   Preanesthetic Checklist Completed: patient identified, IV checked, risks and benefits discussed, monitors and equipment checked, pre-op evaluation and timeout performed  Epidural Patient position: sitting Prep: DuraPrep and site prepped and draped Patient monitoring: continuous pulse ox, blood pressure, heart rate and cardiac monitor Approach: midline Location: L3-L4 Injection technique: LOR air  Needle:  Needle type: Tuohy  Needle gauge: 17 G Needle length: 9 cm Needle insertion depth: 9 cm Catheter type: closed end flexible Catheter size: 19 Gauge Catheter at skin depth: 14 cm Test dose: negative  Assessment Sensory level: T8 Events: blood not aspirated, injection not painful, no injection resistance, no paresthesia and negative IV test  Additional Notes Difficult placement due to poor patient positioning and lack of cooperation; attempted at two different levels. Intentional DPE with 24G sprotte through tuohy, clear CSF. After DPE, extreme patient movement upon threading catheter through tuohy- no obvious return of CSF, but patient counseled on increased risk of headache due to movement during this critical part of the procedure. Reason for block:procedure for pain

## 2020-07-15 NOTE — Anesthesia Preprocedure Evaluation (Signed)
Anesthesia Evaluation  Patient identified by MRN, date of birth, ID band Patient awake    Reviewed: Allergy & Precautions, NPO status , Patient's Chart, lab work & pertinent test results  Airway Mallampati: II  TM Distance: >3 FB Neck ROM: Full    Dental no notable dental hx.    Pulmonary asthma ,    Pulmonary exam normal breath sounds clear to auscultation       Cardiovascular negative cardio ROS Normal cardiovascular exam Rhythm:Regular Rate:Normal     Neuro/Psych PSYCHIATRIC DISORDERS Anxiety Depression negative neurological ROS     GI/Hepatic negative GI ROS, Neg liver ROS,   Endo/Other  Hypothyroidism Obesity BMI 36  Renal/GU negative Renal ROS  negative genitourinary   Musculoskeletal negative musculoskeletal ROS (+)   Abdominal (+) + obese,   Peds negative pediatric ROS (+)  Hematology  (+) Blood dyscrasia, , hct 36.3, plt 101 Appears to be gestational thrombocytopenia- around 120 throughout this pregnancy   Anesthesia Other Findings   Reproductive/Obstetrics (+) Pregnancy 3rd delivery, has had epidural in past                             Anesthesia Physical Anesthesia Plan  ASA: II and emergent  Anesthesia Plan: Epidural   Post-op Pain Management:    Induction:   PONV Risk Score and Plan: 2  Airway Management Planned: Natural Airway  Additional Equipment: None  Intra-op Plan:   Post-operative Plan:   Informed Consent: I have reviewed the patients History and Physical, chart, labs and discussed the procedure including the risks, benefits and alternatives for the proposed anesthesia with the patient or authorized representative who has indicated his/her understanding and acceptance.       Plan Discussed with:   Anesthesia Plan Comments:         Anesthesia Quick Evaluation

## 2020-07-16 ENCOUNTER — Encounter (HOSPITAL_COMMUNITY)
Admission: AD | Disposition: A | Discharge status: Home / Self Care | Payer: Self-pay | Source: Home / Self Care | Attending: Obstetrics and Gynecology

## 2020-07-16 ENCOUNTER — Encounter (HOSPITAL_COMMUNITY): Payer: Self-pay | Admitting: Anesthesiology

## 2020-07-16 ENCOUNTER — Encounter (HOSPITAL_COMMUNITY): Payer: Self-pay | Admitting: Obstetrics and Gynecology

## 2020-07-16 DIAGNOSIS — O480 Post-term pregnancy: Secondary | ICD-10-CM

## 2020-07-16 DIAGNOSIS — Z3A40 40 weeks gestation of pregnancy: Secondary | ICD-10-CM

## 2020-07-16 LAB — RPR: RPR Ser Ql: NONREACTIVE

## 2020-07-16 SURGERY — LIGATION, FALLOPIAN TUBE, POSTPARTUM
Anesthesia: Choice | Laterality: Bilateral

## 2020-07-16 MED ORDER — LACTATED RINGERS IV SOLN: INTRAVENOUS | Status: DC

## 2020-07-16 MED ORDER — WITCH HAZEL-GLYCERIN EX PADS: 1.0000 "application " | MEDICATED_PAD | CUTANEOUS | Status: DC | PRN

## 2020-07-16 MED ORDER — ACETAMINOPHEN 325 MG PO TABS
650.0000 mg | ORAL_TABLET | ORAL | Status: DC | PRN
  Administered 2020-07-16 – 2020-07-18 (×4): 650 mg via ORAL
  Filled 2020-07-16 (×4): qty 2

## 2020-07-16 MED ORDER — ONDANSETRON HCL 4 MG/2ML IJ SOLN: 4.0000 mg | INTRAMUSCULAR | Status: DC | PRN

## 2020-07-16 MED ORDER — TETANUS-DIPHTH-ACELL PERTUSSIS 5-2.5-18.5 LF-MCG/0.5 IM SUSP: 0.5000 mL | Freq: Once | INTRAMUSCULAR | Status: DC

## 2020-07-16 MED ORDER — BENZOCAINE-MENTHOL 20-0.5 % EX AERO: 1.0000 "application " | INHALATION_SPRAY | CUTANEOUS | Status: DC | PRN

## 2020-07-16 MED ORDER — DIPHENHYDRAMINE HCL 25 MG PO CAPS: 25.0000 mg | ORAL_CAPSULE | Freq: Four times a day (QID) | ORAL | Status: DC | PRN

## 2020-07-16 MED ORDER — PRENATAL MULTIVITAMIN CH
1.0000 | ORAL_TABLET | Freq: Every day | ORAL | Status: DC
  Administered 2020-07-16 – 2020-07-18 (×3): 1 via ORAL
  Filled 2020-07-16 (×3): qty 1

## 2020-07-16 MED ORDER — ONDANSETRON HCL 4 MG PO TABS: 4.0000 mg | ORAL_TABLET | ORAL | Status: DC | PRN

## 2020-07-16 MED ORDER — METOCLOPRAMIDE HCL 10 MG PO TABS
10.0000 mg | ORAL_TABLET | Freq: Once | ORAL | Status: AC
  Administered 2020-07-16: 10 mg via ORAL
  Filled 2020-07-16: qty 1

## 2020-07-16 MED ORDER — ZOLPIDEM TARTRATE 5 MG PO TABS
5.0000 mg | ORAL_TABLET | Freq: Every evening | ORAL | Status: DC | PRN
  Administered 2020-07-16: 5 mg via ORAL
  Filled 2020-07-16: qty 1

## 2020-07-16 MED ORDER — COCONUT OIL OIL: 1.0000 "application " | TOPICAL_OIL | Status: DC | PRN

## 2020-07-16 MED ORDER — SENNOSIDES-DOCUSATE SODIUM 8.6-50 MG PO TABS
2.0000 | ORAL_TABLET | ORAL | Status: DC
  Administered 2020-07-17: 2 via ORAL
  Filled 2020-07-16: qty 2

## 2020-07-16 MED ORDER — FAMOTIDINE 20 MG PO TABS
40.0000 mg | ORAL_TABLET | Freq: Once | ORAL | Status: AC
  Administered 2020-07-16: 40 mg via ORAL
  Filled 2020-07-16: qty 2

## 2020-07-16 MED ORDER — IBUPROFEN 600 MG PO TABS
600.0000 mg | ORAL_TABLET | Freq: Four times a day (QID) | ORAL | Status: DC
  Administered 2020-07-16 – 2020-07-18 (×10): 600 mg via ORAL
  Filled 2020-07-16 (×10): qty 1

## 2020-07-16 MED ORDER — SIMETHICONE 80 MG PO CHEW: 80.0000 mg | CHEWABLE_TABLET | ORAL | Status: DC | PRN

## 2020-07-16 MED ORDER — DIBUCAINE (PERIANAL) 1 % EX OINT: 1.0000 "application " | TOPICAL_OINTMENT | CUTANEOUS | Status: DC | PRN

## 2020-07-16 NOTE — Telephone Encounter (Signed)
Per chart review see that patient went to [facility] and found to be in labor and has since delivered. Emer Onnen,RN

## 2020-07-16 NOTE — Discharge Summary (Signed)
Postpartum Discharge Summary  Date of Service updated 07/18/20     Patient Name: Ashlee Simmons DOB: 1996/04/15 MRN: [id number]  Date of admission: 07/15/2020 Delivery date:07/16/2020  Delivering provider: Lajean Manes  Date of discharge: 07/18/2020  Admitting diagnosis: Supervision of high-risk pregnancy [O09.90] Intrauterine pregnancy: [redacted]w[redacted]d    Secondary diagnosis:  Active Problems:   Supervision of high-risk pregnancy   SVD (spontaneous vaginal delivery)   Shoulder dystocia during labor and delivery, delivered  Additional problems: none    Discharge diagnosis: Term Pregnancy Delivered                                              Post partum procedures:none Augmentation: N/A Complications: Shoulder dystocia  Hospital course: Onset of Labor With Vaginal Delivery      24y.o. yo G3P3003 at 439w2das admitted in Active Labor on 07/15/2020. Patient had an uncomplicated labor course as follows:  Membrane Rupture Time/Date: 12:35 AM ,07/16/2020   Delivery Method:Vaginal, Spontaneous  Episiotomy: None  Lacerations:  None  Patient had an uncomplicated postpartum course.  She is ambulating, tolerating a regular diet, passing flatus, and urinating well. Patient is discharged home in stable condition on 07/18/20.  Newborn Data: Birth date:07/16/2020  Birth time:1:37 AM  Gender:Female  Living status:Living  Apgars:6 ,8  Weight:3731 g   Magnesium Sulfate received: No BMZ received: No Rhophylac:N/A MMR:N/A T-DaP:Given prenatally Flu: No Transfusion:No  Physical exam  Vitals:   07/16/20 2100 07/17/20 0405 07/17/20 2051 07/18/20 0541  BP: 101/62 (!) 99/58 106/79 110/73  Pulse: 86 78 75 70  Resp: _0 Temp: 98.4 F (36.9 C) 98.1 F (36.7 C) 98.3 F (36.8 C) 97.7 F (36.5 C)  TempSrc: Oral Oral Oral Oral  SpO2: 100% 99% 97% 99%  Weight:      Height:       General: alert, cooperative and no distress Lochia: appropriate Uterine Fundus:  firm Incision: N/A DVT Evaluation: No evidence of DVT seen on physical exam. Labs: Lab Results  Component Value Date   WBC 12.0 (H) 07/15/2020   HGB 12.1 07/15/2020   HCT 36.3 07/15/2020   MCV 86.8 07/15/2020   PLT 101 (L) 07/15/2020   CMP Latest Ref Rng & Units 04/27/2020  Glucose 70 - 99 mg/dL 98  BUN 6 - 20 mg/dL <5(L)  Creatinine 0.44 - 1.00 mg/dL 0.47  Sodium 135 - 145 mmol/L 137  Potassium 3.5 - 5.1 mmol/L 3.4(L)  Chloride 98 - 111 mmol/L 107  CO2 22 - 32 mmol/L 21(L)  Calcium 8.9 - 10.3 mg/dL 8.6(L)  Total Protein 6.5 - 8.1 g/dL 6.3(L)  Total Bilirubin 0.3 - 1.2 mg/dL 0.3  Alkaline Phos 38 - 126 U/L 62  AST 15 - 41 U/L 14(L)  ALT 0 - 44 U/L 12   Edinburgh Score: Edinburgh Postnatal Depression Scale Screening Tool 07/17/2020  I have been able to laugh and see the funny side of things. 0  I have looked forward with enjoyment to things. 0  I have blamed myself unnecessarily when things went wrong. 1  I have been anxious or worried for no good reason. 2  I have felt scared or panicky for no good reason. 1  Things have been getting on top of me. 0  I have been so unhappy that I have had difficulty sleeping. 0  I have felt sad or miserable. 0  I have been so unhappy that I have been crying. 0  The thought of harming myself has occurred to me. 0  Edinburgh Postnatal Depression Scale Total 4     After visit meds:  Allergies as of 07/18/2020   No Known Allergies     Medication List    TAKE these medications   acetaminophen 325 MG tablet Commonly known as: TYLENOL Take 650 mg by mouth every 6 (six) hours as needed.   albuterol 108 (90 Base) MCG/ACT inhaler Commonly known as: VENTOLIN HFA Inhale 1-2 puffs into the lungs every 6 (six) hours as needed for wheezing or shortness of breath.   Blood Pressure Kit Devi 1 Device by Does not apply route as needed.   cyclobenzaprine 10 MG tablet Commonly known as: FLEXERIL Take 1 tablet (10 mg total) by mouth 3 (three)  times daily as needed for muscle spasms (back pain). What changed:   medication strength  how much to take  reasons to take this   ibuprofen 600 MG tablet Commonly known as: ADVIL Take 1 tablet (600 mg total) by mouth every 6 (six) hours as needed for moderate pain or cramping.   PRENATAL VITAMINS PO Take 1 tablet by mouth.        Discharge home in stable condition Infant Feeding: No evidence of DVT seen on physical exam. Infant Disposition:home with mother Discharge instruction: per After Visit Summary and Postpartum booklet. Activity: Advance as tolerated. Pelvic rest for 6 weeks.  Diet: routine diet Future Appointments:  4 weeks (or earlier to meet with MD) Follow up Visit:  [place] [facility] [place] at [facility]. Schedule an appointment as soon as possible for a visit in 4 week(s).   Specialty: Obstetrics and Gynecology Why: Make appointment to be seen in 4 weeks for post partum care Contact information: [place] [address] [phone]               Please schedule this patient for a Virtual postpartum visit in 4 weeks with the following provider: Any provider. Additional Postpartum F/U:none  Low risk pregnancy complicated by: obesity, anxiety and depression Delivery mode:  Vaginal, Spontaneous  Anticipated Birth Control:  BTL done [facility]   07/18/2020 Hansel Feinstein, CNM

## 2020-07-16 NOTE — Progress Notes (Signed)
Talked with Dr Armond Hang.  Discussed platelet count.  Dr Armond Hang reviewed chart.  OK to d/c epidural catheter

## 2020-07-16 NOTE — Anesthesia Postprocedure Evaluation (Signed)
Anesthesia Post Note  Patient: Ashlee Simmons  Procedure(s) Performed: AN AD HOC LABOR EPIDURAL     Patient location during evaluation: Mother Baby Anesthesia Type: Epidural Level of consciousness: awake and alert Pain management: pain level controlled Vital Signs Assessment: post-procedure vital signs reviewed and stable Respiratory status: spontaneous breathing, nonlabored ventilation and respiratory function stable Cardiovascular status: stable Postop Assessment: no headache, no backache and epidural receding Anesthetic complications: no   No complications documented.  Last Vitals:  Vitals:   07/16/20 0445 07/16/20 0840  BP: 111/70 94/67  Pulse: 98 (!) 102  Resp: 18 18  Temp: 37 C 36.8 C  SpO2: 100%     Last Pain:  Vitals:   07/16/20 0840  TempSrc: Oral  PainSc: 6    Pain Goal:                   [employer]

## 2020-07-17 MED ORDER — IBUPROFEN 600 MG PO TABS: 600.0000 mg | ORAL_TABLET | Freq: Four times a day (QID) | ORAL | 0 refills | Status: DC | PRN

## 2020-07-17 MED ORDER — CYCLOBENZAPRINE HCL 10 MG PO TABS
10.0000 mg | ORAL_TABLET | Freq: Three times a day (TID) | ORAL | Status: DC | PRN
  Administered 2020-07-17: 10 mg via ORAL
  Filled 2020-07-17: qty 1

## 2020-07-17 MED ORDER — CYCLOBENZAPRINE HCL 10 MG PO TABS: 10.0000 mg | ORAL_TABLET | Freq: Three times a day (TID) | ORAL | 0 refills | Status: DC | PRN

## 2020-07-17 NOTE — Progress Notes (Signed)
Post Partum Day 1 Subjective: Patient was seen on the floor to assess pain at the epidural site. Patient states that the acetamenophin and ibuprofen are not helping reduce her pain. She has tried to walk around some but states moving around a lot can exacerbate her pain. She has recently started using a heating pad which has helped but did not take away her pain. Patient ranks the pain a constant 7/10. She states she has some intercostal pain during her pregnancy and was given Flexeril. She is hoping to recieve Flexeril to help reduce her current pain symptoms  Objective: Blood pressure (!) 99/58, pulse 78, temperature 98.1 F (36.7 C), temperature source Oral, resp. rate 20, height 5\' 2"  (1.575 m), weight 88.5 kg, last menstrual period 10/08/2019, SpO2 99 %, unknown if currently breastfeeding.  Physical Exam:  General: alert  MSK: LE Strength 5/5 bilaterally; Negative SLR bilaterally Neuro: TTP in the right lower perispinal region, lateral to the epidural site. Sensation in LE is 5/5 bilaterally  Skin: No evidence of hematoma noted on exam at epidural site; mild ecchymosis at injection site  DVT Evaluation: No evidence of DVT seen on physical exam.  Recent Labs    07/15/20 2028  HGB 12.1  HCT 36.3    Assessment/Plan:  We will order Flexeril 10mg  TID as needed for pain. Patient told to continue to use heating pad and ambulating.    LOS: 2 days   2029 PA-C 07/17/2020, 6:03 PM

## 2020-07-17 NOTE — Progress Notes (Signed)
POSTPARTUM PROGRESS NOTE  Post Partum Day 1  Subjective:  Ashlee Simmons is a 24 y.o. [id number] s/p SVD at [redacted]w[redacted]d.  No acute events overnight.  Pt denies problems with ambulating, voiding or po intake.  She denies nausea or vomiting.  Pain is poorly controlled- patient has been having increased back pain since difficult epidural placement. Patient reports abdominal pain is well controlled on tylenol and ibuprofen. Patient is requesting additional medication for back pain- was on flexeril at home.  She has had flatus. She has not had bowel movement.  Lochia Small.   Objective: Blood pressure (!) 99/58, pulse 78, temperature 98.1 F (36.7 C), temperature source Oral, resp. rate 20, height 5\' 2"  (1.575 m), weight 88.5 kg, last menstrual period 10/08/2019, SpO2 99 %, unknown if currently breastfeeding.  Physical Exam:  General: alert, cooperative and no distress Chest: no respiratory distress Heart:regular rate, distal pulses intact Abdomen: soft, nontender,  Uterine Fundus: firm, appropriately tender DVT Evaluation: No calf swelling or tenderness Extremities: no edema Skin: warm, dry  Recent Labs    07/15/20 2028  HGB 12.1  HCT 36.3    Assessment/Plan: Ashlee Simmons is a 24 y.o. [id number] s/p SVD at [redacted]w[redacted]d   PPD#1 - Doing well Contraception: Patient plans interval BTL, patient did not want PP BTL due to increased back pain from difficult epidural placement and patient did not get full relief with epidural. Discussed with patient contraception bridge, patient declines and reports that she does not plan on having IC until after tubal  Feeding: breast and formula  Dispo: Patient wants to be discharged today, unlikely for baby to be discharged due to elevated bilirubin levels. Will hold off on discharge until tomorrow unless baby is discharged today.     LOS: 2 days   [redacted]w[redacted]d, CNM 07/17/2020, 7:16 AM

## 2020-07-18 NOTE — Discharge Instructions (Signed)

## 2020-07-19 ENCOUNTER — Other Ambulatory Visit: Payer: Medicaid Other

## 2020-07-21 ENCOUNTER — Inpatient Hospital Stay (HOSPITAL_COMMUNITY)
Admission: AD | Admit: 2020-07-21 | Payer: Medicaid Other | Source: Home / Self Care | Admitting: Obstetrics and Gynecology

## 2020-07-21 ENCOUNTER — Inpatient Hospital Stay (HOSPITAL_COMMUNITY): Payer: Medicaid Other

## 2020-08-14 ENCOUNTER — Ambulatory Visit (INDEPENDENT_AMBULATORY_CARE_PROVIDER_SITE_OTHER): Payer: Medicaid Other | Admitting: Family Medicine

## 2020-08-14 ENCOUNTER — Other Ambulatory Visit: Payer: Self-pay

## 2020-08-14 ENCOUNTER — Encounter: Payer: Self-pay | Admitting: Family Medicine

## 2020-08-14 ENCOUNTER — Telehealth: Payer: Self-pay | Admitting: Clinical

## 2020-08-14 DIAGNOSIS — O99340 Other mental disorders complicating pregnancy, unspecified trimester: Secondary | ICD-10-CM

## 2020-08-14 DIAGNOSIS — Z8759 Personal history of other complications of pregnancy, childbirth and the puerperium: Secondary | ICD-10-CM

## 2020-08-14 DIAGNOSIS — R55 Syncope and collapse: Secondary | ICD-10-CM

## 2020-08-14 DIAGNOSIS — Z1331 Encounter for screening for depression: Secondary | ICD-10-CM

## 2020-08-14 DIAGNOSIS — F53 Postpartum depression: Secondary | ICD-10-CM

## 2020-08-14 DIAGNOSIS — F419 Anxiety disorder, unspecified: Secondary | ICD-10-CM

## 2020-08-14 DIAGNOSIS — F32A Depression, unspecified: Secondary | ICD-10-CM

## 2020-08-14 MED ORDER — SERTRALINE HCL 50 MG PO TABS: 50.0000 mg | ORAL_TABLET | Freq: Every day | ORAL | 5 refills | Status: AC

## 2020-08-14 NOTE — Progress Notes (Signed)
GYNECOLOGY OFFICE VISIT NOTE  History:   Ashlee Simmons is a 24 y.o. [id number] here today for PP follow up.  Lots of stress lately, her partners uncle died and her brother was shot and almost killed in the past week Denies SI, interested in taking meds and seeing Asher Muir, has tried sertraline in the past  Would like to get a tubal, didn't do so while admitted due to problems with epidural and pain control Does not want interval contraception at this time  Past Medical History:  Diagnosis Date  . Anxiety   . Asthma   . Depression   . PTSD (post-traumatic stress disorder)     Past Surgical History:  Procedure Laterality Date  . FEMUR SURGERY     plate in pelvis, rods in each femur  . HIP SURGERY    . MOUTH SURGERY      The following portions of the patient's history were reviewed and updated as appropriate: allergies, current medications, past family history, past medical history, past social history, past surgical history and problem list.   Health Maintenance:  No pap on file  Review of Systems:  Pertinent items noted in HPI and remainder of comprehensive ROS otherwise negative.  Physical Exam:  BP 122/82   Pulse 85   Wt 171 lb 1.6 oz (77.6 kg)   BMI 31.29 kg/m  CONSTITUTIONAL: Well-developed, well-nourished female in no acute distress.  HEENT:  Normocephalic, atraumatic. External right and left ear normal. No scleral icterus.  NECK: Normal range of motion, supple, no masses noted on observation SKIN: No rash noted. Not diaphoretic. No erythema. No pallor. MUSCULOSKELETAL: Normal range of motion. No edema noted. NEUROLOGIC: Alert and oriented to person, place, and time. Normal muscle tone coordination. PSYCHIATRIC: Normal mood and affect. Normal behavior. Normal judgment and thought content. RESPIRATORY: Effort normal, no problems with respiration noted ABDOMEN: No masses noted. No other overt distention noted.   PELVIC: Deferred  Labs and Imaging No results found  for this or any previous visit (from the past 168 hour(s)). No results found.    Assessment and Plan:   Problem List Items Addressed This Visit      Genitourinary   Shoulder dystocia during labor and delivery, delivered     Other   Anxiety   Relevant Medications   sertraline (ZOLOFT) 50 MG tablet   Depression    Significant life stressors, no SI Would like to start sertraline, 50mg  nightly sent to pharmacy To take half tab nightly for one week then full tab F/u in 2 months for mood check or can message me on mychart if dose increase needed      Relevant Medications   sertraline (ZOLOFT) 50 MG tablet   SVD (spontaneous vaginal delivery) - Primary    Significant life stressors since delivery but no SI and able to care for infant, see separate problem Mostly bottle feeding, going well Would like interval tubal, will schedule with OBGYN for preop consult States she had medicaid prior to pregnancy, should not have issue with 6 week interval Signed papers in 03/2020, in media tab Declines interval birth control, counseled on options       Other Visit Diagnoses    Positive depression screening       Relevant Orders   Ambulatory referral to Integrated Behavioral [facility]      Routine preventative health maintenance measures emphasized. Please refer to After Visit Summary for other counseling recommendations.   Return in about 1 week (around 08/21/2020),  or interval tubal ligation, mood check.    Total face-to-face time with patient: 20 minutes.  Over 50% of encounter was spent on counseling and coordination of care.   Venora Maples, MD Center for [facility], [facility]

## 2020-08-14 NOTE — Assessment & Plan Note (Addendum)
Significant life stressors since delivery but no SI and able to care for infant, see separate problem Mostly bottle feeding, going well Would like interval tubal, will schedule with OBGYN for preop consult States she had medicaid prior to pregnancy, should not have issue with 6 week interval Signed papers in 03/2020, in media tab Declines interval birth control, counseled on options Declines flu and covid vaccines Due for pap but declines today, to be collected at preop visit

## 2020-08-14 NOTE — Progress Notes (Deleted)
    Post Partum Visit Note  Ashlee Simmons is a 24 y.o. [id number] female who presents for a postpartum visit. She is 4 weeks postpartum following a normal spontaneous vaginal delivery.  I have fully reviewed the prenatal and intrapartum course. The delivery was at *** gestational weeks.  Anesthesia: epidural. Postpartum course has been ***. Baby is doing well***. Baby is feeding by bottle - [name] . Bleeding thin lochia. Bowel function is normal. Bladder function is normal. Patient is not sexually active. Contraception method is tubal ligation. Postpartum depression screening: {gen negative/positive:315881}.   The pregnancy intention screening data noted above was reviewed. Potential methods of contraception were discussed. The patient elected to proceed with {Upstream End Methods:24109}.      {Common ambulatory SmartLinks:19316}  Review of Systems {ros; complete:30496}    Objective:  There were no vitals taken for this visit.   General:  {gen appearance:16600}   Breasts:  {breast exam:1202::"inspection negative, no nipple discharge or bleeding, no masses or nodularity palpable"}  Lungs: {lung exam:16931}  Heart:  {heart exam:5510}  Abdomen: {abdomen exam:16834}   Vulva:  {labia exam:12198}  Vagina: {vagina exam:12200}  Cervix:  {cervix exam:14595}  Corpus: {uterus exam:12215}  Adnexa:  {adnexa exam:12223}  Rectal Exam: {rectal/vaginal exam:12274}        Assessment:    *** postpartum exam. Pap smear {done:10129} at today's visit.   Plan:   Essential components of care per ACOG recommendations:  1.  Mood and well being: Patient with {gen negative/positive:315881} depression screening today. Reviewed local resources for support.  - Patient {Action; does/does not:19097} use tobacco. ***If using tobacco we discussed reduction and for recently cessation risk of relapse - hx of drug use? {yes/no:20286}  *** If yes, discussed support systems  2. Infant care and feeding:    -Patient currently breastmilk feeding? {yes/no:20286} ***If breastmilk feeding discussed return to work and pumping. If needed, patient was provided letter for work to allow for every 2-3 hr pumping breaks, and to be granted a private location to express breastmilk and refrigerated area to store breastmilk. Reviewed importance of draining breast regularly to support lactation. -Social determinants of health (SDOH) reviewed in EPIC. No concerns***The following needs were identified***  3. Sexuality, contraception and birth spacing - Patient {DOES_DOES HQP:59163} want a pregnancy in the next year.  Desired family size is {NUMBER 1-10:22536} children.  - Reviewed forms of contraception in tiered fashion. Patient desired {PLAN CONTRACEPTION:313102} today.   - Discussed birth spacing of 18 months  4. Sleep and fatigue -Encouraged family/partner/community support of 4 hrs of uninterrupted sleep to help with mood and fatigue  5. Physical Recovery  - Discussed patients delivery*** and complications - Patient had a *** degree laceration, perineal healing reviewed. Patient expressed understanding - Patient has urinary incontinence? {yes/no:20286}*** Patient was referred to pelvic floor PT  - Patient {ACTION; IS/IS WGY:65993570} safe to resume physical and sexual activity  6.  Health Maintenance - Last pap smear done *** and was {Normal/abnormal wildcard:19619} with negative HPV. ***Mammogram  7. ***Chronic Disease - PCP follow up  Ralene Bathe, RN Center for [employer], [facility]

## 2020-08-14 NOTE — Telephone Encounter (Signed)
Follow up with patient, as agreed-upon: Pt requests to schedule virtual visit with [facility] on 08/30/20 at 2:15pm.   Pt worries that she is "forgetting where I am and what I'm doing" for about a year now. Pt says she has migraines with "headache, eye pain, hands go numb, sensitive to light, nausea, and sometimes I black out". Pt feels that "something isn't quite right, feels like more than a headache", and that she suspects she may need a brain scan; pt says she has never seen a neurologist, and uncertain if Medicaid has assigned her a PCP or not.   [facility] will: - Send a list of possible PCP's via MyChart  - Request referral to neurologist from medical provider - Call pt back with result of request for referral to neurology -Schedule [facility] Virtual Consult visit for 08/30/20 at 2:15pm for postpartum mood check, monitor BH medication symptoms, and supportive counseling for recent family loss  Pt will:  -Check Medicaid app to see if a PCP has already been assigned or not; if not, establish care with new PCP -Talk to PCP about any additional referrals needed  -Take Zoloft as prescribed -Attend [facility] appointment on 08/30/20 at 2:15pm

## 2020-08-14 NOTE — Assessment & Plan Note (Signed)
Significant life stressors, no SI Would like to start sertraline, 50mg  nightly sent to pharmacy To take half tab nightly for one week then full tab F/u in 2 months for mood check or can message me on mychart if dose increase needed

## 2020-08-14 NOTE — Patient Instructions (Signed)
Postpartum Care After Vaginal Delivery This sheet gives you information about how to care for yourself from the time you deliver your baby to up to 6-12 weeks after delivery (postpartum period). Your health care provider may also give you more specific instructions. If you have problems or questions, contact your health care provider. Follow these instructions at home: Vaginal bleeding  It is normal to have vaginal bleeding (lochia) after delivery. Wear a sanitary pad for vaginal bleeding and discharge. ? During the first week after delivery, the amount and appearance of lochia is often similar to a menstrual period. ? Over the next few weeks, it will gradually decrease to a dry, yellow-brown discharge. ? For most women, lochia stops completely by 4-6 weeks after delivery. Vaginal bleeding can vary from woman to woman.  Change your sanitary pads frequently. Watch for any changes in your flow, such as: ? A sudden increase in volume. ? A change in color. ? Large blood clots.  If you pass a blood clot from your vagina, save it and call your health care provider to discuss. Do not flush blood clots down the toilet before talking with your health care provider.  Do not use tampons or douches until your health care provider says this is safe.  If you are not breastfeeding, your period should return 6-8 weeks after delivery. If you are feeding your child breast milk only (exclusive breastfeeding), your period may not return until you stop breastfeeding. Perineal care  Keep the area between the vagina and the anus (perineum) clean and dry as told by your health care provider. Use medicated pads and pain-relieving sprays and creams as directed.  If you had a cut in the perineum (episiotomy) or a tear in the vagina, check the area for signs of infection until you are healed. Check for: ? More redness, swelling, or pain. ? Fluid or blood coming from the cut or tear. ? Warmth. ? Pus or a bad  smell.  You may be given a squirt bottle to use instead of wiping to clean the perineum area after you go to the bathroom. As you start healing, you may use the squirt bottle before wiping yourself. Make sure to wipe gently.  To relieve pain caused by an episiotomy, a tear in the vagina, or swollen veins in the anus (hemorrhoids), try taking a warm sitz bath 2-3 times a day. A sitz bath is a warm water bath that is taken while you are sitting down. The water should only come up to your hips and should cover your buttocks. Breast care  Within the first few days after delivery, your breasts may feel heavy, full, and uncomfortable (breast engorgement). Milk may also leak from your breasts. Your health care provider can suggest ways to help relieve the discomfort. Breast engorgement should go away within a few days.  If you are breastfeeding: ? Wear a bra that supports your breasts and fits you well. ? Keep your nipples clean and dry. Apply creams and ointments as told by your health care provider. ? You may need to use breast pads to absorb milk that leaks from your breasts. ? You may have uterine contractions every time you breastfeed for up to several weeks after delivery. Uterine contractions help your uterus return to its normal size. ? If you have any problems with breastfeeding, work with your health care provider or lactation consultant.  If you are not breastfeeding: ? Avoid touching your breasts a lot. Doing this can make   your breasts produce more milk. ? Wear a good-fitting bra and use cold packs to help with swelling. ? Do not squeeze out (express) milk. This causes you to make more milk. Intimacy and sexuality  Ask your health care provider when you can engage in sexual activity. This may depend on: ? Your risk of infection. ? How fast you are healing. ? Your comfort and desire to engage in sexual activity.  You are able to get pregnant after delivery, even if you have not had  your period. If desired, talk with your health care provider about methods of birth control (contraception). Medicines  Take over-the-counter and prescription medicines only as told by your health care provider.  If you were prescribed an antibiotic medicine, take it as told by your health care provider. Do not stop taking the antibiotic even if you start to feel better. Activity  Gradually return to your normal activities as told by your health care provider. Ask your health care provider what activities are safe for you.  Rest as much as possible. Try to rest or take a nap while your baby is sleeping. Eating and drinking   Drink enough fluid to keep your urine pale yellow.  Eat high-fiber foods every day. These may help prevent or relieve constipation. High-fiber foods include: ? Whole grain cereals and breads. ? Brown rice. ? Beans. ? Fresh fruits and vegetables.  Do not try to lose weight quickly by cutting back on calories.  Take your prenatal vitamins until your postpartum checkup or until your health care provider tells you it is okay to stop. Lifestyle  Do not use any products that contain nicotine or tobacco, such as cigarettes and e-cigarettes. If you need help quitting, ask your health care provider.  Do not drink alcohol, especially if you are breastfeeding. General instructions  Keep all follow-up visits for you and your baby as told by your health care provider. Most women visit their health care provider for a postpartum checkup within the first 3-6 weeks after delivery. Contact a health care provider if:  You feel unable to cope with the changes that your child brings to your life, and these feelings do not go away.  You feel unusually sad or worried.  Your breasts become red, painful, or hard.  You have a fever.  You have trouble holding urine or keeping urine from leaking.  You have little or no interest in activities you used to enjoy.  You have not  breastfed at all and you have not had a menstrual period for 12 weeks after delivery.  You have stopped breastfeeding and you have not had a menstrual period for 12 weeks after you stopped breastfeeding.  You have questions about caring for yourself or your baby.  You pass a blood clot from your vagina. Get help right away if:  You have chest pain.  You have difficulty breathing.  You have sudden, severe leg pain.  You have severe pain or cramping in your lower abdomen.  You bleed from your vagina so much that you fill more than one sanitary pad in one hour. Bleeding should not be heavier than your heaviest period.  You develop a severe headache.  You faint.  You have blurred vision or spots in your vision.  You have bad-smelling vaginal discharge.  You have thoughts about hurting yourself or your baby. If you ever feel like you may hurt yourself or others, or have thoughts about taking your own life, get help  right away. You can go to the nearest emergency department or call:  Your local emergency services (911 in the [place]  A suicide crisis helpline, such as the National Suicide Prevention Lifeline at 1-800-273-8255. This is open 24 hours a day. Summary  The period of time right after you deliver your newborn up to 6-12 weeks after delivery is called the postpartum period.  Gradually return to your normal activities as told by your health care provider.  Keep all follow-up visits for you and your baby as told by your health care provider. This information is not intended to replace advice given to you by your health care provider. Make sure you discuss any questions you have with your health care provider. Document Revised: 09/11/2017 Document Reviewed: 06/22/2017 Elsevier Patient Education  2020 Elsevier Inc.    Contraception Choices Contraception, also called birth control, means things to use or ways to try not to get pregnant. Hormonal birth control This kind  of birth control uses hormones. Here are some types of hormonal birth control:  A tube that is put under skin of the arm (implant). The tube can stay in for as long as 3 years.  Shots to get every 3 months (injections).  Pills to take every day (birth control pills).  A patch to change 1 time each week for 3 weeks (birth control patch). After that, the patch is taken off for 1 week.  A ring to put in the vagina. The ring is left in for 3 weeks. Then it is taken out of the vagina for 1 week. Then a new ring is put in.  Pills to take after unprotected sex (emergency birth control pills). Barrier birth control Here are some types of barrier birth control:  A thin covering that is put on the penis before sex (female condom). The covering is thrown away after sex.  A soft, loose covering that is put in the vagina before sex (female condom). The covering is thrown away after sex.  A rubber bowl that sits over the cervix (diaphragm). The bowl must be made for you. The bowl is put into the vagina before sex. The bowl is left in for 6-8 hours after sex. It is taken out within 24 hours.  A small, soft cup that fits over the cervix (cervical cap). The cup must be made for you. The cup can be left in for 6-8 hours after sex. It is taken out within 48 hours.  A sponge that is put into the vagina before sex. It must be left in for at least 6 hours after sex. It must be taken out within 30 hours. Then it is thrown away.  A chemical that kills or stops sperm from getting into the uterus (spermicide). It may be a pill, cream, jelly, or foam to put in the vagina. The chemical should be used at least 10-15 minutes before sex. IUD (intrauterine) birth control An IUD is a small, T-shaped piece of plastic. It is put inside the uterus. There are two kinds:  Hormone IUD. This kind can stay in for 3-5 years.  Copper IUD. This kind can stay in for 10 years. Permanent birth control Here are some types of  permanent birth control:  Surgery to block the fallopian tubes.  Having an insert put into each fallopian tube.  Surgery to tie off the tubes that carry sperm (vasectomy). Natural planning birth control Here are some types of natural planning birth control:  Not having sex on   the days the woman could get pregnant.  Using a calendar: ? To keep track of the length of each period. ? To find out what days pregnancy can happen. ? To plan to not have sex on days when pregnancy can happen.  Watching for symptoms of ovulation and not having sex during ovulation. One way the woman can check for ovulation is to check her temperature.  Waiting to have sex until after ovulation. Summary  Contraception, also called birth control, means things to use or ways to try not to get pregnant.  Hormonal methods of birth control include implants, injections, pills, patches, vaginal rings, and emergency birth control pills.  Barrier methods of birth control can include female condoms, female condoms, diaphragms, cervical caps, sponges, and spermicides.  There are two types of IUD (intrauterine device) birth control. An IUD can be put in a woman's uterus to prevent pregnancy for 3-5 years.  Permanent sterilization can be done through a procedure for males, females, or both.  Natural planning methods involve not having sex on the days when the woman could get pregnant. This information is not intended to replace advice given to you by your health care provider. Make sure you discuss any questions you have with your health care provider. Document Revised: 12/29/2018 Document Reviewed: 09/18/2016 Elsevier Patient Education  2020 Elsevier Inc.  

## 2020-08-15 NOTE — Addendum Note (Signed)
Addended by: Merian Capron on: 08/15/2020 05:10 PM   Modules accepted: Orders

## 2020-08-15 NOTE — Telephone Encounter (Signed)
Referral for Neurology placed, please let patient know.

## 2020-08-20 NOTE — BH Specialist Note (Signed)
Integrated Behavioral Health via Telemedicine Video (Caregility) Visit  08/20/2020 Ashlee Simmons [id number]  Number of Integrated Behavioral Health visits: 2  Session Start time: 2:16  Session End time: 2:31 Total time: 15 minutes  Referring Provider: Merian Capron, MD Type of Service: Individual Patient/Family location: [facility] Provider location: [facility]  All persons participating in visit: Patient Ashlee Simmons and [facility]     I connected with [name] by a video enabled telemedicine application Public affairs consultant) and verified that I am speaking with the correct person using two identifiers.   Discussed confidentiality: Yes   Confirmed demographics & insurance:  Yes   I discussed that engaging in this virtual visit, they consent to the provision of behavioral healthcare and the services will be billed under their insurance.   Patient and/or legal guardian expressed understanding and consented to virtual visit: Yes   PRESENTING CONCERNS: Patient and/or family reports the following symptoms/concerns: Pt states her primary concern today is not having a PCP (needs PCP referral for breast reduction); fatigue and difficulty concentrating primary symptoms. Pt is taking Zoloft as prescribed with mild side effect feeling tired, so is taking at night before bed. Pt requests a list of potential PCPs and  mood check post-surgery.  Duration of problem: Postpartum adjustment ; Severity of problem: moderate  STRENGTHS (Protective Factors/Coping Skills): Social connections, Concrete supports in place (healthy food, safe environments, etc.) and Adhering to treatment  ASSESSMENT: Patient currently experiencing Adjustment disorder with mixed anxiety and depression and History of PTSD.    GOALS ADDRESSED: Patient will: 1.  Reduce symptoms of: anxiety, depression and stress  2.  Demonstrate ability to: Increase healthy  adjustment to current life circumstances   Progress of Goals: Ongoing  INTERVENTIONS: Interventions utilized:  Solution-Focused Strategies Standardized Assessments completed & reviewed: GAD-7 and PHQ 9   OUTCOME: Patient Response: Pt agrees to treatment plan; requests mood check post-surgery and list of potential PCPs    PLAN: 1. Follow up with behavioral health clinician on : Three weeks 2. Behavioral recommendations:  -Continue taking Zoloft as prescribed -Continue using self-coping strategies that have remained helpful to manage symptoms (time with partner, children, and pets, etc.) -Contact potential PCPs, sent via MyChart; establish care with PCP of choice for any additional referrals  3. Referral(s): Integrated [employer] (In Clinic)  I discussed the assessment and treatment plan with the patient and/or parent/guardian. They were provided an opportunity to ask questions and all were answered. They agreed with the plan and demonstrated an understanding of the instructions.   They were advised to call back or seek an in-person evaluation as appropriate.  I discussed that the purpose of this visit is to provide behavioral health care while limiting exposure to the novel coronavirus.  Discussed there is a possibility of technology failure and discussed alternative modes of communication if that failure occurs.  Valetta Close [facility]  Depression screen [facility] 2/9 08/30/2020 08/22/2020 07/12/2020 07/05/2020 06/27/2020  Decreased Interest 3 3 0 1 0  Down, Depressed, Hopeless 0 1 0 - 0  PHQ - 2 Score 3 4 0 1 0  Altered sleeping 0 0 0 2 0  Tired, decreased energy 3 3 0 1 0  Change in appetite 1 3 0 0 0  Feeling bad or failure about yourself  0 0 0 0 0  Trouble concentrating 3 1 0 0 0  Moving slowly or fidgety/restless 1 1 0 2 0  Suicidal  thoughts 0 0 0 0 0  PHQ-9 Score 11 12 0 6 0  Difficult doing work/chores - - - - -  Some recent data might be hidden   GAD 7 :  Generalized Anxiety Score 08/30/2020 08/22/2020 07/12/2020 07/05/2020  Nervous, Anxious, on Edge 0 0 0 2  Control/stop worrying 0 0 0 0  Worry too much - different things 0 0 0 0  Trouble relaxing 0 0 0 2  Restless 0 0 0 1  Easily annoyed or irritable 1 0 0 3  Afraid - awful might happen 1 0 0 0  Total GAD 7 Score 2 0 0 8  Anxiety Difficulty - - - -

## 2020-08-22 ENCOUNTER — Encounter: Payer: Self-pay | Admitting: Obstetrics and Gynecology

## 2020-08-22 ENCOUNTER — Ambulatory Visit (INDEPENDENT_AMBULATORY_CARE_PROVIDER_SITE_OTHER): Payer: Medicaid Other | Admitting: Obstetrics and Gynecology

## 2020-08-22 ENCOUNTER — Other Ambulatory Visit: Payer: Self-pay

## 2020-08-22 DIAGNOSIS — Z3009 Encounter for other general counseling and advice on contraception: Secondary | ICD-10-CM

## 2020-08-22 NOTE — Telephone Encounter (Signed)
Message to front office to send notes from Dr. Audie Clear last visit and from [facility] last visit.   Called patient to let her know that Neurology consults has been placed and she should be called by [facility] to be scheduled.

## 2020-08-22 NOTE — Progress Notes (Signed)
Pt here for pre op for BTL BTL papers signed 7/21  PE AF VSS Lungs clear Heart RRR Abd soft + BS  A/P Unwanted fertility  Patient desires bilateral tubal sterilization.  Other reversible forms of contraception were discussed with patient; she declines all other modalities. Discussed bilateral tubal sterilization in detail; discussed options of laparoscopic bilateral tubal sterilization using Filshie clips vs laparoscopic bilateral salpingectomy. Risks and benefits discussed in detail including but not limited to: risk of regret, permanence of method, bleeding, infection, injury to surrounding organs and need for additional procedures.  Failure risk of 1-2 % for Filshie clips and <1% for bilateral salpingectomy with increased risk of ectopic gestation if pregnancy occurs was also discussed with patient.  Also discussed possible reduction of risk of ovarian cancer via bilateral salpingectomy given that a growing body of knowledge reveals that the majority of cases of high grade serous "ovarian" cancer actually are actually  cancers arising from the fimbriated end of the fallopian tubes. Emphasized that removal of fallopian tubes do not result in any known hormonal imbalance.  Patient verbalized understanding of these risks and benefits and wants to proceed with sterilization with laparoscopic bilateral sterilization using bilateral salpingectomy.    She was told that she will be contacted by our surgical scheduler regarding the time and date of her surgery; routine preoperative instructions of having nothing to eat or drink after midnight on the day prior to surgery and also coming to the hospital 1 1/2 hours prior to her time of surgery were also emphasized.  She was told she may be called for a preoperative appointment about a week prior to surgery and will be given further preoperative instructions at that visit.  Routine postoperative instructions will be reviewed with the patient and her family in  detail after surgery. Printed patient education handouts about the procedure was given to the patient to review at home.  Medicaid papers have been signed   In the meantime, patient will use absence for contraception prior to surgery.

## 2020-08-24 ENCOUNTER — Telehealth: Payer: Self-pay | Admitting: Clinical

## 2020-08-24 NOTE — Telephone Encounter (Signed)
Left HIPPA-compliant message to call back Jamie from [facility] at [phone] (main office) or [phone] (Jamie's office).   Rebecca W (Supervisor: Jamie McMannes) 

## 2020-08-29 ENCOUNTER — Other Ambulatory Visit: Payer: Self-pay

## 2020-08-29 ENCOUNTER — Encounter (HOSPITAL_BASED_OUTPATIENT_CLINIC_OR_DEPARTMENT_OTHER): Payer: Self-pay | Admitting: Obstetrics and Gynecology

## 2020-08-30 ENCOUNTER — Ambulatory Visit (INDEPENDENT_AMBULATORY_CARE_PROVIDER_SITE_OTHER): Payer: Medicaid Other | Admitting: Clinical

## 2020-08-30 DIAGNOSIS — Z1331 Encounter for screening for depression: Secondary | ICD-10-CM

## 2020-08-30 DIAGNOSIS — Z8659 Personal history of other mental and behavioral disorders: Secondary | ICD-10-CM

## 2020-08-30 DIAGNOSIS — F4323 Adjustment disorder with mixed anxiety and depressed mood: Secondary | ICD-10-CM

## 2020-08-31 NOTE — H&P (Signed)
Ashlee Simmons is an 24 y.o. female with unwanted fertility. Pt desires bilateral salpingectomy   Menstrual History: Menarche age: 37 No LMP recorded.    Past Medical History:  Diagnosis Date  . Anxiety   . Asthma   . Depression   . PTSD (post-traumatic stress disorder)     Past Surgical History:  Procedure Laterality Date  . FEMUR SURGERY     plate in pelvis, rods in each femur  . HIP SURGERY    . MOUTH SURGERY      Family History  Problem Relation Age of Onset  . COPD Maternal Grandfather   . Thyroid disease Mother   . Thyroid disease Maternal Grandmother   . Alcohol abuse Neg Hx   . Arthritis Neg Hx   . Asthma Neg Hx   . Birth defects Neg Hx   . Cancer Neg Hx   . Depression Neg Hx   . Diabetes Neg Hx   . Drug abuse Neg Hx   . Early death Neg Hx   . Hearing loss Neg Hx   . Heart disease Neg Hx   . Hyperlipidemia Neg Hx   . Hypertension Neg Hx   . Kidney disease Neg Hx   . Learning disabilities Neg Hx   . Mental illness Neg Hx   . Mental retardation Neg Hx   . Miscarriages / Stillbirths Neg Hx   . Stroke Neg Hx   . Vision loss Neg Hx   . Varicose Veins Neg Hx   . Intellectual disability Neg Hx   . Obesity Neg Hx   . ADD / ADHD Neg Hx   . Anxiety disorder Neg Hx     Social History:  reports that she has never smoked. She has quit using smokeless tobacco. She reports previous alcohol use. She reports previous drug use. Drug: Marijuana.  Allergies: No Known Allergies  No medications prior to admission.    Review of Systems  Constitutional: Negative.   Respiratory: Negative.   Cardiovascular: Negative.   Gastrointestinal: Negative.   Genitourinary: Negative.     Height 5\' 2"  (1.575 m), weight 75.8 kg, not currently breastfeeding. Physical Exam Constitutional:      Appearance: Normal appearance.  Cardiovascular:     Rate and Rhythm: Normal rate and regular rhythm.  Pulmonary:     Effort: Pulmonary effort is normal.     Breath sounds: Normal  breath sounds.  Abdominal:     General: Bowel sounds are normal.     Palpations: Abdomen is soft.  Genitourinary:    Comments: Deferred to OR Neurological:     Mental Status: She is alert.     No results found for this or any previous visit (from the past 24 hour(s)).  No results found.  Assessment/Plan: Unwanted fertility  Patient desires bilateral tubal sterilization.  Other reversible forms of contraception were discussed with patient; she declines all other modalities. Discussed bilateral tubal sterilization in detail; discussed options of laparoscopic bilateral tubal sterilization using Filshie clips vs laparoscopic bilateral salpingectomy. Risks and benefits discussed in detail including but not limited to: risk of regret, permanence of method, bleeding, infection, injury to surrounding organs and need for additional procedures.  Failure risk of 1-2 % for Filshie clips and <1% for bilateral salpingectomy with increased risk of ectopic gestation if pregnancy occurs was also discussed with patient.  Also discussed possible reduction of risk of ovarian cancer via bilateral salpingectomy given that a growing body of knowledge reveals that the majority of  cases of high grade serous "ovarian" cancer actually are actually  cancers arising from the fimbriated end of the fallopian tubes. Emphasized that removal of fallopian tubes do not result in any known hormonal imbalance.  Patient verbalized understanding of these risks and benefits and wants to proceed with sterilization with laparoscopic bilateral salpingectomy      Hermina Staggers 08/31/2020, 8:32 AM

## 2020-09-01 ENCOUNTER — Other Ambulatory Visit (HOSPITAL_COMMUNITY): Payer: Medicaid Other

## 2020-09-03 ENCOUNTER — Other Ambulatory Visit (HOSPITAL_COMMUNITY)
Admission: RE | Admit: 2020-09-03 | Discharge: 2020-09-03 | Disposition: A | Discharge status: Home / Self Care | Payer: Medicaid Other | Source: Ambulatory Visit | Attending: Obstetrics and Gynecology | Admitting: Obstetrics and Gynecology

## 2020-09-03 DIAGNOSIS — Z01812 Encounter for preprocedural laboratory examination: Secondary | ICD-10-CM | POA: Diagnosis not present

## 2020-09-03 DIAGNOSIS — Z20822 Contact with and (suspected) exposure to covid-19: Secondary | ICD-10-CM | POA: Insufficient documentation

## 2020-09-04 LAB — SARS CORONAVIRUS 2 (TAT 6-24 HRS): SARS Coronavirus 2: NEGATIVE

## 2020-09-05 ENCOUNTER — Encounter (HOSPITAL_BASED_OUTPATIENT_CLINIC_OR_DEPARTMENT_OTHER): Payer: Self-pay | Admitting: Obstetrics and Gynecology

## 2020-09-05 ENCOUNTER — Ambulatory Visit (HOSPITAL_BASED_OUTPATIENT_CLINIC_OR_DEPARTMENT_OTHER): Payer: Medicaid Other | Admitting: Anesthesiology

## 2020-09-05 ENCOUNTER — Encounter (HOSPITAL_BASED_OUTPATIENT_CLINIC_OR_DEPARTMENT_OTHER)
Admission: RE | Disposition: A | Discharge status: Home / Self Care | Payer: Self-pay | Source: Home / Self Care | Attending: Obstetrics and Gynecology

## 2020-09-05 ENCOUNTER — Ambulatory Visit (HOSPITAL_BASED_OUTPATIENT_CLINIC_OR_DEPARTMENT_OTHER)
Admission: RE | Admit: 2020-09-05 | Discharge: 2020-09-05 | Disposition: A | Discharge status: Home / Self Care | Payer: Medicaid Other | Attending: Obstetrics and Gynecology | Admitting: Obstetrics and Gynecology

## 2020-09-05 ENCOUNTER — Other Ambulatory Visit: Payer: Self-pay

## 2020-09-05 DIAGNOSIS — Z302 Encounter for sterilization: Secondary | ICD-10-CM | POA: Insufficient documentation

## 2020-09-05 HISTORY — PX: LAPAROSCOPIC BILATERAL SALPINGECTOMY: SHX5889

## 2020-09-05 LAB — CBC
HCT: 41.6 % (ref 36.0–46.0)
Hemoglobin: 13.8 g/dL (ref 12.0–15.0)
MCH: 27.3 pg (ref 26.0–34.0)
MCHC: 33.2 g/dL (ref 30.0–36.0)
MCV: 82.4 fL (ref 80.0–100.0)
Platelets: 182 10*3/uL (ref 150–400)
RBC: 5.05 MIL/uL (ref 3.87–5.11)
RDW: 12.8 % (ref 11.5–15.5)
WBC: 4 10*3/uL (ref 4.0–10.5)
nRBC: 0 % (ref 0.0–0.2)

## 2020-09-05 LAB — POCT PREGNANCY, URINE
Preg Test, Ur: NEGATIVE
Preg Test, Ur: NEGATIVE

## 2020-09-05 SURGERY — SALPINGECTOMY, BILATERAL, LAPAROSCOPIC
Anesthesia: General | Site: Uterus | Laterality: Bilateral

## 2020-09-05 MED ORDER — MIDAZOLAM HCL 2 MG/2ML IJ SOLN
INTRAMUSCULAR | Status: AC
  Filled 2020-09-05: qty 2

## 2020-09-05 MED ORDER — FENTANYL CITRATE (PF) 100 MCG/2ML IJ SOLN
INTRAMUSCULAR | Status: AC
  Filled 2020-09-05: qty 2

## 2020-09-05 MED ORDER — SOD CITRATE-CITRIC ACID 500-334 MG/5ML PO SOLN
ORAL | Status: AC
  Filled 2020-09-05: qty 15

## 2020-09-05 MED ORDER — MIDAZOLAM HCL 5 MG/5ML IJ SOLN
INTRAMUSCULAR | Status: DC | PRN
  Administered 2020-09-05: 2 mg via INTRAVENOUS

## 2020-09-05 MED ORDER — ROCURONIUM BROMIDE 100 MG/10ML IV SOLN
INTRAVENOUS | Status: DC | PRN
  Administered 2020-09-05: 70 mg via INTRAVENOUS

## 2020-09-05 MED ORDER — DEXAMETHASONE SODIUM PHOSPHATE 4 MG/ML IJ SOLN
INTRAMUSCULAR | Status: DC | PRN
  Administered 2020-09-05: 10 mg via INTRAVENOUS

## 2020-09-05 MED ORDER — PROPOFOL 10 MG/ML IV BOLUS
INTRAVENOUS | Status: DC | PRN
  Administered 2020-09-05: 150 mg via INTRAVENOUS

## 2020-09-05 MED ORDER — ACETAMINOPHEN 500 MG PO TABS
ORAL_TABLET | ORAL | Status: AC
  Filled 2020-09-05: qty 2

## 2020-09-05 MED ORDER — LACTATED RINGERS IV SOLN: INTRAVENOUS | Status: DC

## 2020-09-05 MED ORDER — DROPERIDOL 2.5 MG/ML IJ SOLN
INTRAMUSCULAR | Status: DC | PRN
  Administered 2020-09-05: .625 mg via INTRAVENOUS

## 2020-09-05 MED ORDER — PROMETHAZINE HCL 25 MG/ML IJ SOLN: 6.2500 mg | INTRAMUSCULAR | Status: DC | PRN

## 2020-09-05 MED ORDER — ONDANSETRON HCL 4 MG/2ML IJ SOLN
INTRAMUSCULAR | Status: DC | PRN
  Administered 2020-09-05: 4 mg via INTRAVENOUS

## 2020-09-05 MED ORDER — PROPOFOL 500 MG/50ML IV EMUL
INTRAVENOUS | Status: AC
  Filled 2020-09-05: qty 50

## 2020-09-05 MED ORDER — EPHEDRINE SULFATE 50 MG/ML IJ SOLN
INTRAMUSCULAR | Status: DC | PRN
  Administered 2020-09-05: 10 mg via INTRAVENOUS

## 2020-09-05 MED ORDER — DEXMEDETOMIDINE (PRECEDEX) IN NS 20 MCG/5ML (4 MCG/ML) IV SYRINGE
PREFILLED_SYRINGE | INTRAVENOUS | Status: AC
  Filled 2020-09-05: qty 5

## 2020-09-05 MED ORDER — KETOROLAC TROMETHAMINE 15 MG/ML IJ SOLN
15.0000 mg | INTRAMUSCULAR | Status: AC
  Administered 2020-09-05: 09:00:00 15 mg via INTRAVENOUS

## 2020-09-05 MED ORDER — SUGAMMADEX SODIUM 500 MG/5ML IV SOLN
INTRAVENOUS | Status: AC
  Filled 2020-09-05: qty 5

## 2020-09-05 MED ORDER — SOD CITRATE-CITRIC ACID 500-334 MG/5ML PO SOLN
30.0000 mL | ORAL | Status: AC
  Administered 2020-09-05: 09:00:00 30 mL via ORAL

## 2020-09-05 MED ORDER — ONDANSETRON HCL 4 MG/2ML IJ SOLN
INTRAMUSCULAR | Status: AC
  Filled 2020-09-05: qty 2

## 2020-09-05 MED ORDER — POVIDONE-IODINE 10 % EX SWAB
2.0000 "application " | Freq: Once | CUTANEOUS | Status: AC
  Administered 2020-09-05: 2 via TOPICAL

## 2020-09-05 MED ORDER — HYDROMORPHONE HCL 1 MG/ML IJ SOLN
INTRAMUSCULAR | Status: AC
  Filled 2020-09-05: qty 0.5

## 2020-09-05 MED ORDER — OXYCODONE HCL 5 MG PO TABS: 5.0000 mg | ORAL_TABLET | Freq: Once | ORAL | Status: DC | PRN

## 2020-09-05 MED ORDER — DEXMEDETOMIDINE HCL IN NACL 200 MCG/50ML IV SOLN
INTRAVENOUS | Status: DC | PRN
  Administered 2020-09-05: 16 ug via INTRAVENOUS

## 2020-09-05 MED ORDER — OXYCODONE HCL 5 MG PO TABS: 5.0000 mg | ORAL_TABLET | Freq: Four times a day (QID) | ORAL | 0 refills | Status: DC | PRN

## 2020-09-05 MED ORDER — OXYCODONE HCL 5 MG/5ML PO SOLN: 5.0000 mg | Freq: Once | ORAL | Status: DC | PRN

## 2020-09-05 MED ORDER — LIDOCAINE 2% (20 MG/ML) 5 ML SYRINGE
INTRAMUSCULAR | Status: AC
  Filled 2020-09-05: qty 5

## 2020-09-05 MED ORDER — PHENYLEPHRINE 40 MCG/ML (10ML) SYRINGE FOR IV PUSH (FOR BLOOD PRESSURE SUPPORT)
PREFILLED_SYRINGE | INTRAVENOUS | Status: AC
  Filled 2020-09-05: qty 10

## 2020-09-05 MED ORDER — IBUPROFEN 800 MG PO TABS: 800.0000 mg | ORAL_TABLET | Freq: Three times a day (TID) | ORAL | 1 refills | Status: AC | PRN

## 2020-09-05 MED ORDER — KETOROLAC TROMETHAMINE 15 MG/ML IJ SOLN
INTRAMUSCULAR | Status: AC
  Filled 2020-09-05: qty 1

## 2020-09-05 MED ORDER — SUGAMMADEX SODIUM 500 MG/5ML IV SOLN
INTRAVENOUS | Status: DC | PRN
  Administered 2020-09-05: 250 mg via INTRAVENOUS

## 2020-09-05 MED ORDER — SUCCINYLCHOLINE CHLORIDE 200 MG/10ML IV SOSY
PREFILLED_SYRINGE | INTRAVENOUS | Status: AC
  Filled 2020-09-05: qty 10

## 2020-09-05 MED ORDER — DEXAMETHASONE SODIUM PHOSPHATE 10 MG/ML IJ SOLN
INTRAMUSCULAR | Status: AC
  Filled 2020-09-05: qty 1

## 2020-09-05 MED ORDER — EPHEDRINE 5 MG/ML INJ
INTRAVENOUS | Status: AC
  Filled 2020-09-05: qty 10

## 2020-09-05 MED ORDER — BUPIVACAINE HCL 0.5 % IJ SOLN
INTRAMUSCULAR | Status: DC | PRN
  Administered 2020-09-05: 30 mL

## 2020-09-05 MED ORDER — MEPERIDINE HCL 25 MG/ML IJ SOLN: 6.2500 mg | INTRAMUSCULAR | Status: DC | PRN

## 2020-09-05 MED ORDER — FENTANYL CITRATE (PF) 100 MCG/2ML IJ SOLN
INTRAMUSCULAR | Status: DC | PRN
  Administered 2020-09-05: 50 ug via INTRAVENOUS
  Administered 2020-09-05: 100 ug via INTRAVENOUS

## 2020-09-05 MED ORDER — ACETAMINOPHEN 500 MG PO TABS
1000.0000 mg | ORAL_TABLET | ORAL | Status: AC
  Administered 2020-09-05: 09:00:00 1000 mg via ORAL

## 2020-09-05 MED ORDER — LIDOCAINE HCL (CARDIAC) PF 100 MG/5ML IV SOSY
PREFILLED_SYRINGE | INTRAVENOUS | Status: DC | PRN
  Administered 2020-09-05: 60 mg via INTRAVENOUS

## 2020-09-05 MED ORDER — HYDROMORPHONE HCL 1 MG/ML IJ SOLN
0.2500 mg | INTRAMUSCULAR | Status: DC | PRN
  Administered 2020-09-05: 0.25 mg via INTRAVENOUS

## 2020-09-05 SURGICAL SUPPLY — 44 items
ADH SKN CLS APL DERMABOND .7 (GAUZE/BANDAGES/DRESSINGS) ×1
APL SRG 38 LTWT LNG FL B (MISCELLANEOUS)
APPLICATOR ARISTA FLEXITIP XL (MISCELLANEOUS) IMPLANT
BAG SPEC RTRVL LRG 6X4 10 (ENDOMECHANICALS)
DERMABOND ADVANCED (GAUZE/BANDAGES/DRESSINGS) ×2
DERMABOND ADVANCED .7 DNX12 (GAUZE/BANDAGES/DRESSINGS) ×1 IMPLANT
DRSG OPSITE POSTOP 3X4 (GAUZE/BANDAGES/DRESSINGS) ×3 IMPLANT
DURAPREP 26ML APPLICATOR (WOUND CARE) ×3 IMPLANT
GAUZE 4X4 16PLY RFD (DISPOSABLE) ×3 IMPLANT
GLOVE BIO SURGEON STRL SZ7.5 (GLOVE) ×6 IMPLANT
GLOVE BIOGEL PI IND STRL 7.0 (GLOVE) ×2 IMPLANT
GLOVE BIOGEL PI INDICATOR 7.0 (GLOVE) ×4
GLOVE SRG 8 PF TXTR STRL LF DI (GLOVE) ×1 IMPLANT
GLOVE SURG SYN 7.5  E (GLOVE) ×2
GLOVE SURG SYN 7.5 E (GLOVE) ×1 IMPLANT
GLOVE SURG UNDER POLY LF SZ8 (GLOVE) ×3
GOWN STRL REUS W/ TWL LRG LVL3 (GOWN DISPOSABLE) ×2 IMPLANT
GOWN STRL REUS W/ TWL XL LVL3 (GOWN DISPOSABLE) ×1 IMPLANT
GOWN STRL REUS W/TWL LRG LVL3 (GOWN DISPOSABLE) ×6
GOWN STRL REUS W/TWL XL LVL3 (GOWN DISPOSABLE) ×3
HEMOSTAT ARISTA ABSORB 3G PWDR (HEMOSTASIS) IMPLANT
HIBICLENS CHG 4% 4OZ BTL (MISCELLANEOUS) IMPLANT
NDL INSUFF ACCESS 14 VERSASTEP (NEEDLE) IMPLANT
NS IRRIG 1000ML POUR BTL (IV SOLUTION) ×3 IMPLANT
PACK LAPAROSCOPY BASIN (CUSTOM PROCEDURE TRAY) ×3 IMPLANT
PACK TRENDGUARD 450 HYBRID PRO (MISCELLANEOUS) ×1 IMPLANT
PAD OB MATERNITY 4.3X12.25 (PERSONAL CARE ITEMS) ×3 IMPLANT
POUCH SPECIMEN RETRIEVAL 10MM (ENDOMECHANICALS) IMPLANT
SET IRRIG TUBING LAPAROSCOPIC (IRRIGATION / IRRIGATOR) IMPLANT
SET TUBE SMOKE EVAC HIGH FLOW (TUBING) ×3 IMPLANT
SHEARS HARMONIC ACE PLUS 36CM (ENDOMECHANICALS) ×3 IMPLANT
SLEEVE SCD COMPRESS KNEE MED (MISCELLANEOUS) ×3 IMPLANT
SLEEVE XCEL OPT CAN 5 100 (ENDOMECHANICALS) IMPLANT
SUT MNCRL AB 4-0 PS2 18 (SUTURE) ×3 IMPLANT
SUT VICRYL 0 UR6 27IN ABS (SUTURE) ×6 IMPLANT
TOWEL GREEN STERILE FF (TOWEL DISPOSABLE) ×6 IMPLANT
TRAY FOLEY W/BAG SLVR 14FR LF (SET/KITS/TRAYS/PACK) ×3 IMPLANT
TRENDGUARD 450 HYBRID PRO PACK (MISCELLANEOUS) ×3
TROCAR BALLN 12MMX100 BLUNT (TROCAR) ×3 IMPLANT
TROCAR VERSASTEP PLUS 12MM (TROCAR) IMPLANT
TROCAR VERSASTEP PLUS 5MM (TROCAR) IMPLANT
TROCAR XCEL NON-BLD 11X100MML (ENDOMECHANICALS) ×3 IMPLANT
TROCAR XCEL NON-BLD 5MMX100MML (ENDOMECHANICALS) ×6 IMPLANT
WARMER LAPAROSCOPE (MISCELLANEOUS) ×3 IMPLANT

## 2020-09-05 NOTE — Anesthesia Preprocedure Evaluation (Signed)
Anesthesia Evaluation  Patient identified by MRN, date of birth, ID band Patient awake    Reviewed: Allergy & Precautions, NPO status , Patient's Chart, lab work & pertinent test results  Airway Mallampati: II  TM Distance: >3 FB Neck ROM: Full    Dental no notable dental hx.    Pulmonary asthma ,    Pulmonary exam normal breath sounds clear to auscultation       Cardiovascular negative cardio ROS Normal cardiovascular exam Rhythm:Regular Rate:Normal     Neuro/Psych PSYCHIATRIC DISORDERS Anxiety Depression negative neurological ROS     GI/Hepatic negative GI ROS, Neg liver ROS,   Endo/Other  Hypothyroidism Obesity BMI 36  Renal/GU negative Renal ROS  negative genitourinary   Musculoskeletal negative musculoskeletal ROS (+)   Abdominal (+) + obese,   Peds negative pediatric ROS (+)  Hematology  (+) Blood dyscrasia, , hct 36.3, plt 101 Appears to be gestational thrombocytopenia- around 120 throughout this pregnancy   Anesthesia Other Findings   Reproductive/Obstetrics 3rd delivery, has had epidural in past                             Anesthesia Physical  Anesthesia Plan  ASA: II  Anesthesia Plan: General   Post-op Pain Management:    Induction: Intravenous  PONV Risk Score and Plan: 3 and Ondansetron, Dexamethasone, Midazolam and Treatment may vary due to age or medical condition  Airway Management Planned: Oral ETT  Additional Equipment: None  Intra-op Plan:   Post-operative Plan: Extubation in OR  Informed Consent: I have reviewed the patients History and Physical, chart, labs and discussed the procedure including the risks, benefits and alternatives for the proposed anesthesia with the patient or authorized representative who has indicated his/her understanding and acceptance.     Dental advisory given  Plan Discussed with: CRNA  Anesthesia Plan Comments:          Anesthesia Quick Evaluation

## 2020-09-05 NOTE — Anesthesia Postprocedure Evaluation (Signed)
Anesthesia Post Note  Patient: Ashlee Simmons  Procedure(s) Performed: LAPAROSCOPIC BILATERAL SALPINGECTOMY (Bilateral Uterus)     Patient location during evaluation: PACU Anesthesia Type: General Level of consciousness: awake and alert Pain management: pain level controlled Vital Signs Assessment: post-procedure vital signs reviewed and stable Respiratory status: spontaneous breathing, nonlabored ventilation and respiratory function stable Cardiovascular status: blood pressure returned to baseline and stable Postop Assessment: no apparent nausea or vomiting Anesthetic complications: no   No complications documented.  Last Vitals:  Vitals:   09/05/20 1200 09/05/20 1215  BP: 113/85 108/73  Pulse: (!) 104 93  Resp: (!) 21 14  Temp:    SpO2: 98% 98%    Last Pain:  Vitals:   09/05/20 1209  TempSrc:   PainSc: 2                  Lowella Curb

## 2020-09-05 NOTE — Anesthesia Procedure Notes (Signed)
Procedure Name: Intubation Performed by: Verita Lamb, CRNA Pre-anesthesia Checklist: Patient identified, Emergency Drugs available, Suction available, Patient being monitored and Timeout performed Patient Re-evaluated:Patient Re-evaluated prior to induction Oxygen Delivery Method: Circle system utilized Preoxygenation: Pre-oxygenation with 100% oxygen Induction Type: IV induction Ventilation: Mask ventilation without difficulty Laryngoscope Size: Mac and 3 Tube type: Oral Tube size: 7.0 mm Number of attempts: 1 Airway Equipment and Method: Stylet and Oral airway Placement Confirmation: ETT inserted through vocal cords under direct vision,  positive ETCO2,  breath sounds checked- equal and bilateral and CO2 detector Secured at: 22 cm Tube secured with: Tape Dental Injury: Teeth and Oropharynx as per pre-operative assessment

## 2020-09-05 NOTE — Discharge Instructions (Signed)
Laparoscopic Surgery - Care After Laparoscopy is a surgical procedure. It is used to diagnose and treat diseases inside the belly(abdomen). It is usually a brief, common, and relatively simple procedure. The laparoscopeis a thin, lighted, pencil-sized instrument. It is like a telescope. It is inserted into your abdomen through a small cut (incision). Your caregiver can look at the organs inside your body through this instrument.  She can see if there is anything abnormal. Laparoscopy can be done either in a hospital or outpatient clinic. You may be given a mild sedative to help you relax before the procedure. Once in the operating room, you will be given a drug to make you sleep (general anesthesia). Laparoscopy usually lasts about 1 hour. After the procedure, you will be monitored in a recovery area until you are stable and doing well. Once you are home, it may take 3 to 7 days to fully recover.   Laparoscopy has relatively few risks. Your caregiver will discuss the risks with you before the procedure. Some problems that can occur include: RISKS AND COMPLICATIONS   Allergies to medicines.  Difficulty breathing.  Bleeding.  Infection.  Damage to other surrounding structures HOME CARE INSTRUCTIONS   Infection.  Bleeding.  Damage to other organs.  Anesthetic side effects.   Need for additional procedures such as open procedures/laparotomy PROCEDURE Once you receive anesthesia, your surgeon inflates the abdomen with a harmless gas (carbon dioxide). This makes the organs easier to see. The laparoscope is inserted into the abdomen through a small incision. This allows your surgeon to see into the abdomen. Other small instruments are also inserted into the abdomen through other small openings. Many surgeons attach a video camera to the laparoscope to enlarge the view. During a laparoscopy, the surgeon may be looking for inflammation, infection, or cancer.  The surgeon may also need to take  out certain organs or take tissue samples (biopsies). The specimens are sent to a specialist in looking at cells and tissue samples (pathologist). The pathologist examines them under a microscope to help to diagnose or confirm a disease. AFTER THE PROCEDURE   The incisions are closed with stitches (sutures) and Dermabond. Because these incisions are small (usually less than 1/2 inch), there is usually minimal discomfort after the procedure. There may also be discomfort from the instrument placement incisions in the abdomen. You will be given pain medicine to ease any discomfort.  You will rest in a recovery room for 1-2 hours until you are stable and doing well.  You may have some mild discomfort in the throat. This is from the tube placed in your throat while you were sleeping.  You may experience discomfort in the shoulder area from some trapped air between the liver and diaphragm. This sensation is normal and will slowly go away on its own.  The recovery time is shortened as long as there are no complications.  You will rest in a recovery room until stable and doing well. As long as there are no complications, you may be allowed to go home. Someone will need to drive you home and be with you for at least 24 hours once home. FINDING OUT THE RESULTS You will be called with the results of the pathology and will discuss these results with  your caregiver during your postoperative appointment. Do not assume everything is normal if you have not heard from your caregiver or the medical facility. It is important for you to follow up on all of your results. HOME   CARE INSTRUCTIONS   Take all medicines as directed.  Only take over-the-counter or prescription medicines for pain, discomfort, or fever as directed by your caregiver.  Resume daily activities as directed.  Showers are preferred over baths.  You may resume sexual activities in 1 week or as directed.  Do not drive while taking  narcotics. SEEK MEDICAL CARE IF:  There is increasing abdominal pain.  You feel lightheaded or faint.  You have the chills.  You have an oral temperature above 102 F (38.9 C).  There is pus-like (purulent) drainage from any of the wounds.  You are unable to pass gas or have a bowel movement.  You feel sick to your stomach (nauseous) or throw up (vomit). MAKE SURE YOU:   Understand these instructions.  Will watch your condition.  Will get help right away if you are not doing well or get worse.  ExitCare Patient Information 2013 [place], [place].   Post Anesthesia Home Care Instructions  Activity: Get plenty of rest for the remainder of the day. A responsible individual must stay with you for 24 hours following the procedure.  For the next 24 hours, DO NOT: -Drive a car -Advertising copywriter -Drink alcoholic beverages -Take any medication unless instructed by your physician -Make any legal decisions or sign important papers.  Meals: Start with liquid foods such as gelatin or soup. Progress to regular foods as tolerated. Avoid greasy, spicy, heavy foods. If nausea and/or vomiting occur, drink only clear liquids until the nausea and/or vomiting subsides. Call your physician if vomiting continues.  Special Instructions/Symptoms: Your throat may feel dry or sore from the anesthesia or the breathing tube placed in your throat during surgery. If this causes discomfort, gargle with warm salt water. The discomfort should disappear within 24 hours.  If you had a scopolamine patch placed behind your ear for the management of post- operative nausea and/or vomiting:  1. The medication in the patch is effective for 72 hours, after which it should be removed.  Wrap patch in a tissue and discard in the trash. Wash hands thoroughly with soap and water. 2. You may remove the patch earlier than 72 hours if you experience unpleasant side effects which may include dry mouth, dizziness or visual  disturbances. 3. Avoid touching the patch. Wash your hands with soap and water after contact with the patch.    You may take Ibuprofen again at 3:00pm today.

## 2020-09-05 NOTE — Interval H&P Note (Signed)
History and Physical Interval Note:  09/05/2020 9:31 AM  Ashlee Simmons  has presented today for surgery, with the diagnosis of Undesired Fertility.  The various methods of treatment have been discussed with the patient and family. After consideration of risks, benefits and other options for treatment, the patient has consented to  Procedure(s): LAPAROSCOPIC BILATERAL SALPINGECTOMY (Bilateral) as a surgical intervention.  The patient's history has been reviewed, patient examined, no change in status, stable for surgery.  I have reviewed the patient's chart and labs.  Questions were answered to the patient's satisfaction.     Hermina Staggers

## 2020-09-05 NOTE — Transfer of Care (Signed)
Immediate Anesthesia Transfer of Care Note  Patient: Ashlee Simmons  Procedure(s) Performed: LAPAROSCOPIC BILATERAL SALPINGECTOMY (Bilateral Uterus)  Patient Location: PACU  Anesthesia Type:General  Level of Consciousness: awake, alert , oriented, drowsy and patient cooperative  Airway & Oxygen Therapy: Patient Spontanous Breathing and Patient connected to face mask oxygen  Post-op Assessment: Report given to RN and Post -op Vital signs reviewed and stable  Post vital signs: Reviewed and stable  Last Vitals:  Vitals Value Taken Time  BP    Temp    Pulse 93 09/05/20 1129  Resp    SpO2 97 % 09/05/20 1129  Vitals shown include unvalidated device data.  Last Pain:  Vitals:   09/05/20 0902  TempSrc: Oral  PainSc: 0-No pain      Patients Stated Pain Goal: 4 (09/05/20 0902)  Complications: No complications documented.

## 2020-09-05 NOTE — Op Note (Signed)
[name] PROCEDURE DATE: 09/05/2020   PREOPERATIVE DIAGNOSIS:  Undesired fertility  POSTOPERATIVE DIAGNOSIS:  Undesired fertility  PROCEDURE:  Laparoscopic Bilateral Salpingectomy   SURGEON: Nettie Elm, MD    ANESTHESIA:  General endotracheal  COMPLICATIONS:  None immediate.  ESTIMATED BLOOD LOSS:  10 ml.  FLUIDS: As recorded  URINE OUTPUT:  As recorded  IINDICATIONS: 24 y.o. [id number] with undesired fertility, desires permanent sterilization. Other reversible forms of contraception were discussed with patient; she declines all other modalities.  Risks of procedure discussed with patient including permanence of method, risk of regret, bleeding, infection, injury to surrounding organs and need for additional procedures including laparotomy.  Failure risk less than 0.5% with increased risk of ectopic gestation if pregnancy occurs was also discussed with patient.  Written informed consent was obtained.    FINDINGS:  Normal uterus, fallopian tubes, and ovaries.  TECHNIQUE:  The patient was taken to the operating room where general anesthesia was obtained without difficulty.  She was then placed in the dorsal lithotomy position and prepared and draped in sterile fashion.  After an adequate timeout was performed, a bivalved speculum was then placed in the patient's vagina, and the anterior lip of cervix grasped with the single-tooth tenaculum.  The uterine manipulator was then advanced into the uterus.  The speculum was removed from the vagina.  Attention was then turned to the patient's abdomen where a 11-mm skin incision was made in the umbilical fold.  The Optiview 11-mm trocar and sleeve were then advanced without difficulty with the laparoscope under direct visualization into the abdomen.  The abdomen was then insufflated with carbon dioxide gas.  Adequate pneumoperitoneum was obtained.  A survey of the patient's pelvis and abdomen revealed the findings above. Bilateral 5-mm lower  quadrant ports  were then placed under direct visualization.  The fallopian tubes were transected from the uterine attachments and the underlying mesosalpinx with the Harmonic device allowing for bilateral salpingectomy.  The fallopian tubes were then removed from the abdomen under direct visualization.  The operative site was surveyed, and it was found to be hemostatic.   No intraoperative injury to other surrounding organs was noted.  The abdomen was desufflated and all instruments were then removed from the patient's abdomen. The fascial incision of the umbilicus was closed with a 0 Vicryl figure of eight stitch. All skin incisions were closed with Dermabond.  The uterine manipulator was removed from the cervix without complications. The patient tolerated the procedure well.  Sponge, lap, and needle counts were correct times two.  The patient was then taken to the recovery room awake, extubated and in stable condition.  The patient will be discharged to home as per PACU criteria.  Routine postoperative instructions given.  She was prescribed Percocet & Ibuprofen.  She will follow up in the clinic in 4 weeks for postoperative evaluation.   Nettie Elm, MD, FACOG Attending Obstetrician & Gynecologist Faculty Practice, [facility]

## 2020-09-06 ENCOUNTER — Encounter (HOSPITAL_BASED_OUTPATIENT_CLINIC_OR_DEPARTMENT_OTHER): Payer: Self-pay | Admitting: Obstetrics and Gynecology

## 2020-09-06 LAB — SURGICAL PATHOLOGY

## 2020-09-06 NOTE — BH Specialist Note (Signed)
Integrated Behavioral Health via Telemedicine Visit  09/06/2020 Ashlee Simmons [id number]  Pt unable to talk after unexpected loss of her brother on 09/18/20; agrees to a virtual  follow up appointment on 10/08/20 at 1:15pm. Pt sounds flat/numb and difficulty speaking; children's father is in the house to help care for the children during this time. Pt agrees to call [facility] Lilias Lorensen at [phone] if needed prior to re-scheduled visit. Pt verbally agrees to let Dr Alysia Penna know about the loss, prior to her scheduled post-op follow with him.   Valetta Close Marena Witts, LCSW

## 2020-09-07 IMAGING — US US MFM OB FOLLOW-UP
1 series · 14 of 28 positions shown · non-contrast
Comparison: none

[Series 1: us mfm ob follow-up · 14 of 64 slices shown]
[im 3/64]
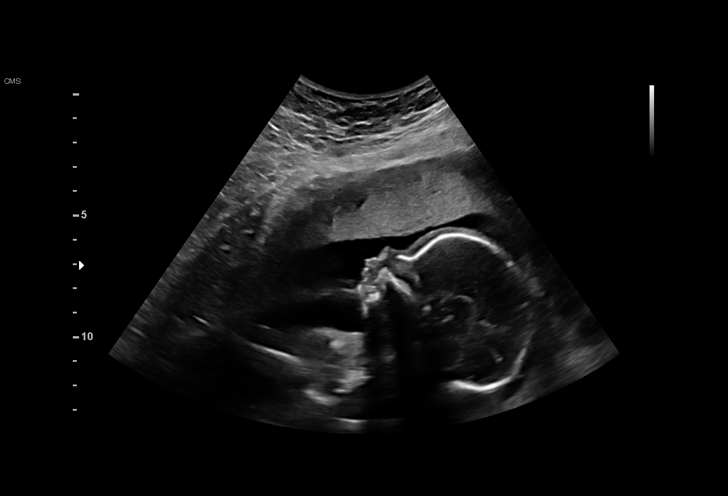
[im 8/64]
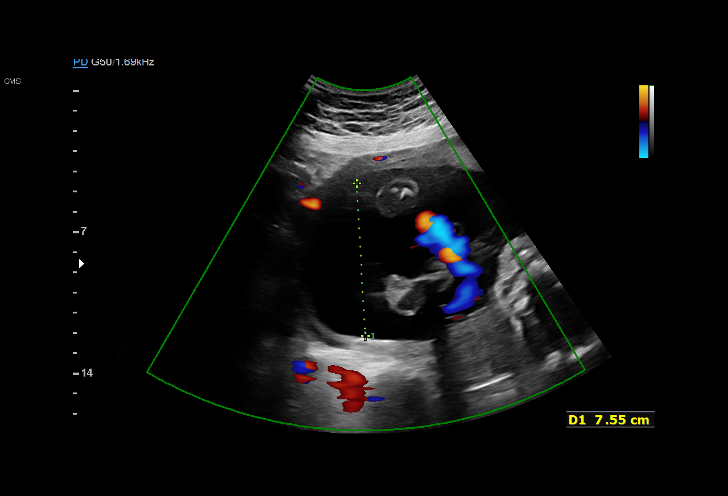
[im 12/64]
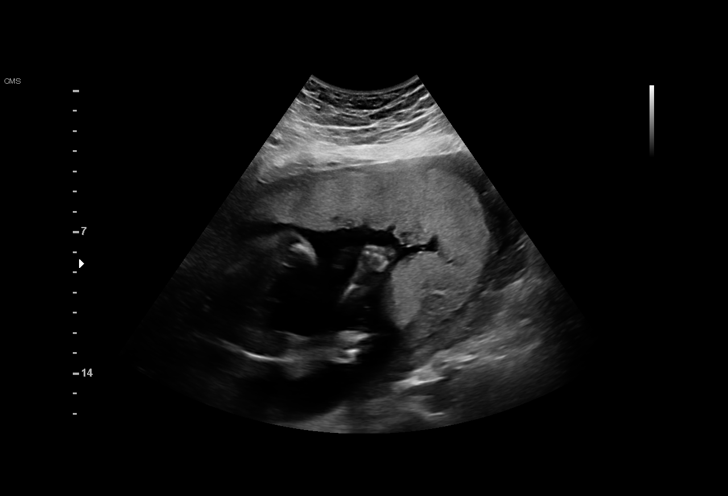
[im 17/64]
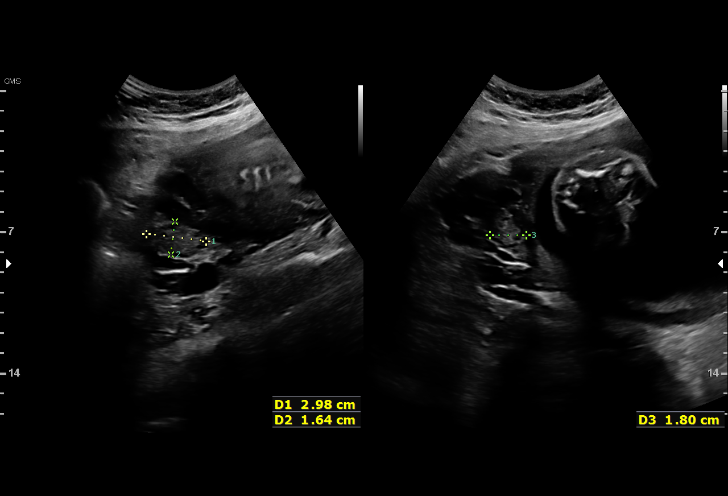
[im 22/64]
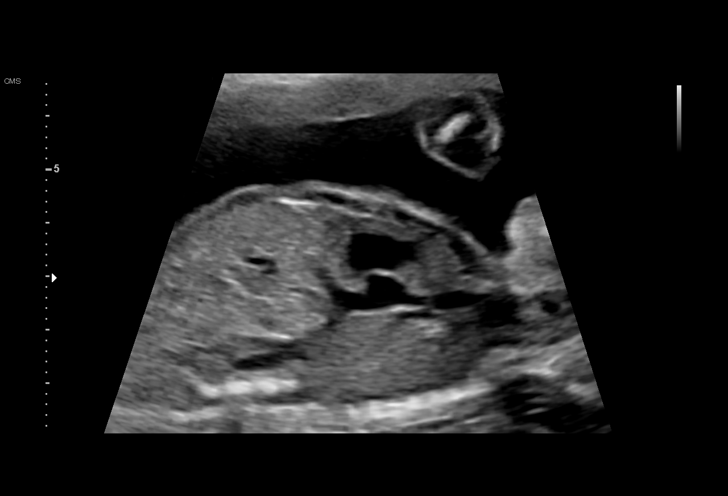
[im 26/64]
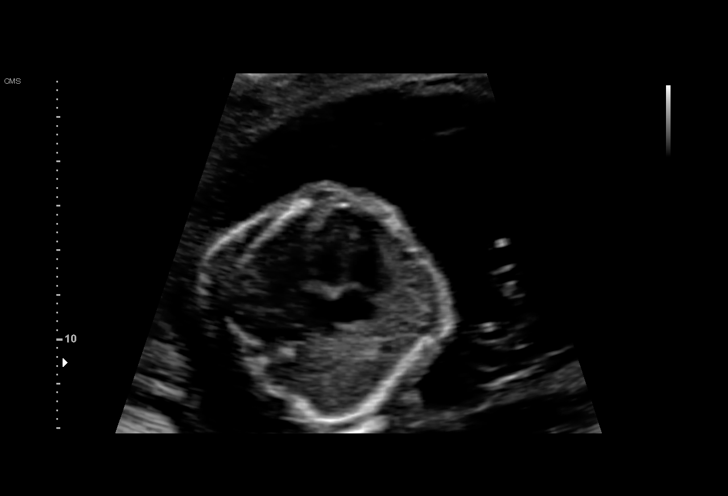
[im 31/64]
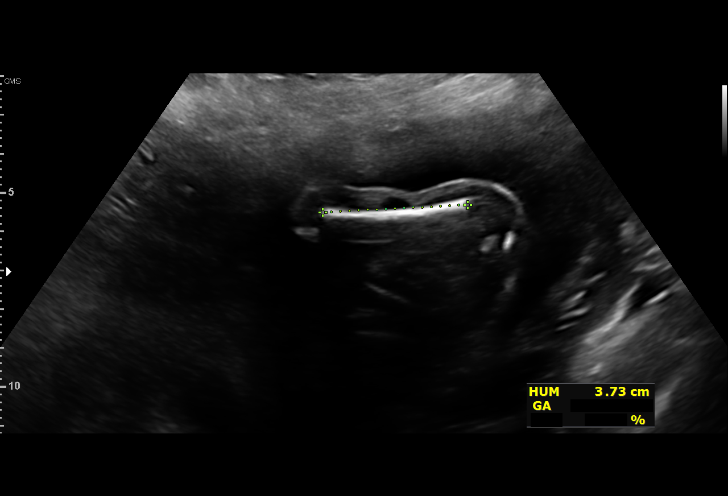
[im 36/64]
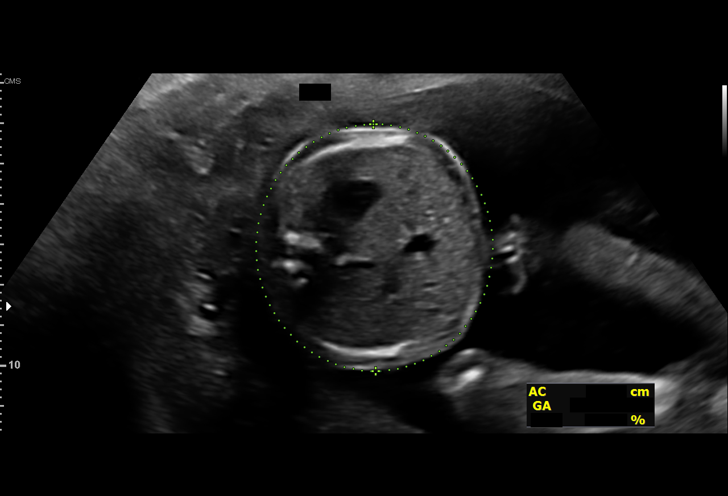
[im 40/64]
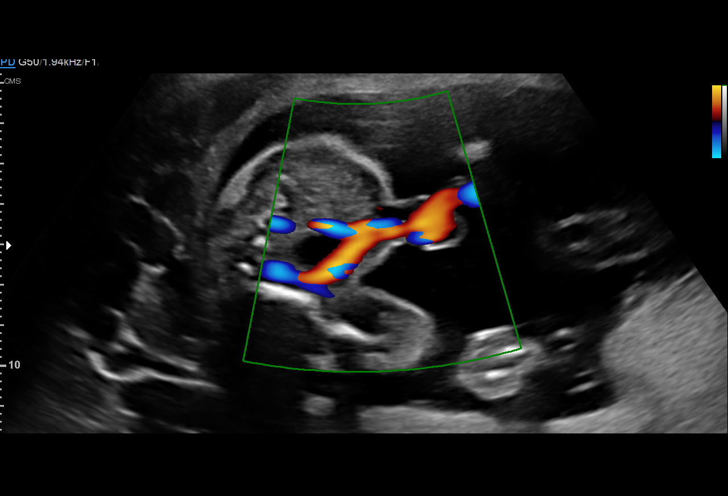
[im 45/64]
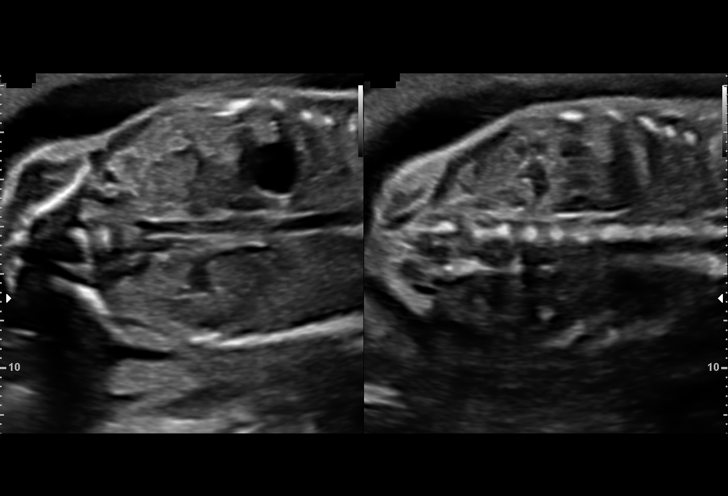
[im 50/64]
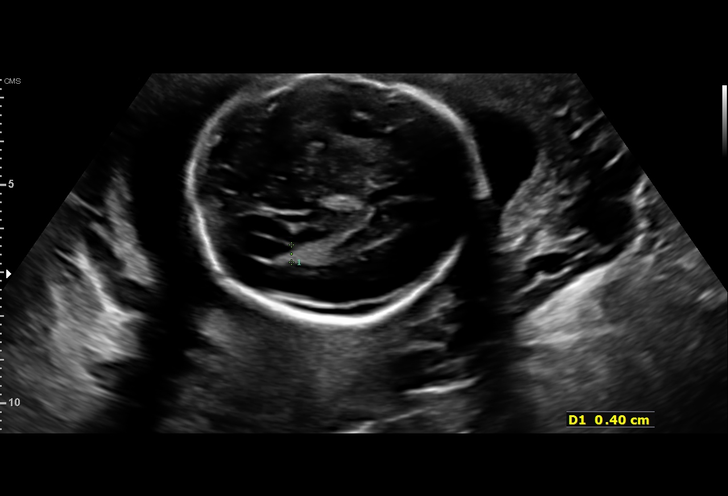
[im 54/64]
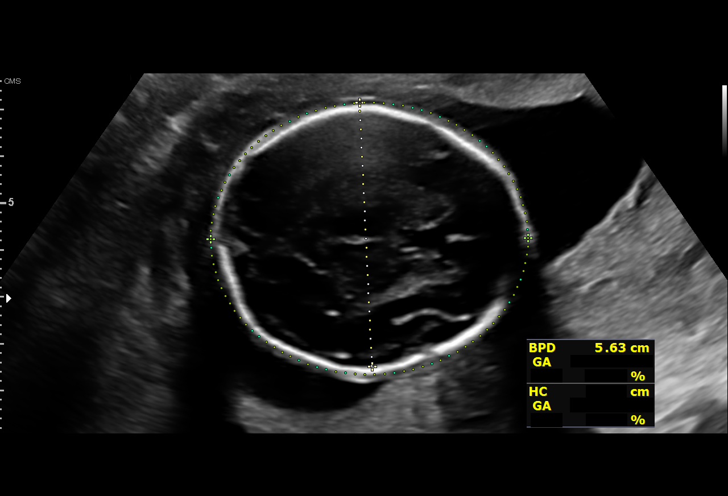
[im 59/64]
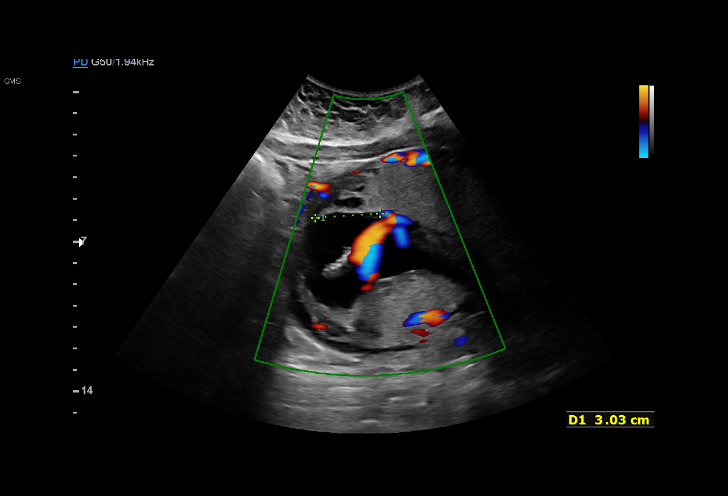
[im 64/64]
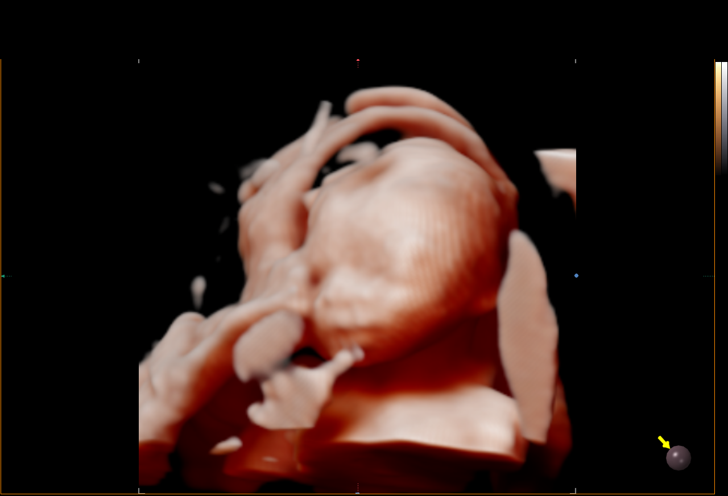

[14 of 28 positions shown; findings below may reference images not displayed]

NP

Indications

 Encounter for other antenatal screening
 follow-up
 Obesity complicating pregnancy, second
 trimester (pregravid BMI:33)
 AFP:Neg, NIPS: Low risk
 22 weeks gestation of pregnancy
Fetal Evaluation

 Num Of Fetuses:          1
 Fetal Heart Rate(bpm):   149
 Cardiac Activity:        Observed
 Presentation:            Cephalic
 Placenta:                Anterior
 P. Cord Insertion:       Visualized

 Amniotic Fluid
 AFI FV:      Within normal limits

                             Largest Pocket(cm)

Biometry

 BPD:        56   mm     G. Age:  23w 1d         61  %    CI:         78.23  %    70 - 86
                                                          FL/HC:       19.0  %    19.2 -
 HC:      200.3   mm     G. Age:  22w 1d         17  %    HC/AC:       1.06       1.05 -
 AC:      189.4   mm     G. Age:  23w 5d         73  %    FL/BPD:      67.9  %    71 - 87
 FL:         38   mm     G. Age:  22w 1d         22  %    FL/AC:       20.1  %    20 - 24
 HUM:      37.3   mm     G. Age:  23w 0d         52  %
 Est. FW:     548   gm      1 lb 3 oz     54  %
OB History

 Gravidity:     3         Term:  2
 Living:        22
Gestational Age

 LMP:            22w 5d       Date:  10/08/19                   EDD:  07/14/20
 U/S Today:      22w 6d                                         EDD:  07/13/20
 Best:           22w 5d    Det. By:  LMP  (10/08/19)            EDD:  07/14/20
Anatomy

 Cranium:                Appears normal         LVOT:                   Appears normal
 Cavum:                  Appears normal         Aortic Arch:            Previously seen
 Ventricles:             Appears normal         Ductal Arch:            Appears normal
 Choroid Plexus:         Appears normal         Diaphragm:              Appears normal
 Cerebellum:             Appears normal         Stomach:                Appears normal, left
                                                                        sided
 Posterior Fossa:        Appears normal         Abdomen:                Appears normal
 Nuchal Fold:            Previously seen        Abdominal Wall:         Appears nml (cord
                                                                        insert, abd wall)
 Face:                   Appears normal         Cord Vessels:           Appears normal (3
                         (orbits and profile)                           vessel cord)
 Lips:                   Appears normal         Kidneys:                Appear normal
 Palate:                 Previously seen        Bladder:                Appears normal
 Thoracic:               Appears normal         Spine:                  Previously seen
 Heart:                  Appears normal         Upper Extremities:      Previously seen
                         (4CH, axis, and situs)
 RVOT:                   Appears normal         Lower Extremities:      Previously seen

 Other:   Heels and open hands previously visualized. Nasal bone visualized.
          Lenses visualized.
Cervix Uterus Adnexa

 Cervix
 Length:             3.9  cm.
 Normal appearance by transabdominal scan.

 Uterus
 No abnormality visualized.

 Right Ovary
 Within normal limits.

 Left Ovary
 Within normal limits.

 Cul De Sac
 No free fluid seen.

 Adnexa
 No abnormality visualized.
Impression

 Follow up growth to clear fetal anatomy
 Normal interval growth with measurements consistent with
 dates
 Good fetal movement and amniotici fluid observed
Recommendations

 Follow up growth as clinically indicated.

## 2020-09-20 ENCOUNTER — Ambulatory Visit: Payer: Medicaid Other | Admitting: Clinical

## 2020-09-20 DIAGNOSIS — F4321 Adjustment disorder with depressed mood: Secondary | ICD-10-CM

## 2020-09-27 NOTE — BH Specialist Note (Deleted)
Integrated Behavioral Health via Telemedicine Visit  09/27/2020 Azharia Surratt [id number]  Number of Integrated Behavioral Health visits: *** Session Start time: 1:15***  Session End time: 2:15*** Total time: {IBH Total Time:21014050}  Referring Provider: *** Patient/Family location: Home*** [facility]: [facility]  All persons participating in visit: Patient *** and [facility] Malik Ruffino ***  Types of Service: {CHL AMB TYPE OF SERVICE:[phone]}  I connected with [name] and/or [name] R[place]ger's {family members:20773} by Telephone  (Video is Surveyor, mining) and verified that I am speaking with the correct person using two identifiers.Discussed confidentiality: {YES/NO:21197}  I discussed the limitations of telemedicine and the availability of in person appointments.  Discussed there is a possibility of technology failure and discussed alternative modes of communication if that failure occurs.  I discussed that engaging in this telemedicine visit, they consent to the provision of behavioral healthcare and the services will be billed under their insurance.  Patient and/or legal guardian expressed understanding and consented to Telemedicine visit: {YES/NO:21197}  Presenting Concerns: Patient and/or family reports the following symptoms/concerns: *** Duration of problem: ***; Severity of problem: {Mild/Moderate/Severe:20260}  Patient and/or Family's Strengths/Protective Factors: {CHL AMB BH PROTECTIVE FACTORS:[phone]}  Goals Addressed: Patient will: 1.  Reduce symptoms of: {IBH Symptoms:21014056}  2.  Increase knowledge and/or ability of: {IBH Patient Tools:21014057}  3.  Demonstrate ability to: {IBH Goals:21014053}  Progress towards Goals: {CHL AMB BH PROGRESS TOWARDS [phone]}  Interventions: Interventions utilized:  {IBH Interventions:21014054} Standardized Assessments completed: {IBH  Screening Tools:21014051}  Patient and/or Family Response: ***  Assessment: Patient currently experiencing ***.   Patient may benefit from ***.  Plan: 1. Follow up with behavioral health clinician on : *** 2. Behavioral recommendations: *** 3. Referral(s): {IBH Referrals:21014055}  I discussed the assessment and treatment plan with the patient and/or parent/guardian. They were provided an opportunity to ask questions and all were answered. They agreed with the plan and demonstrated an understanding of the instructions.   They were advised to call back or seek an in-person evaluation if the symptoms worsen or if the condition fails to improve as anticipated.  Valetta Close Draco Malczewski, LCSW

## 2020-10-04 ENCOUNTER — Encounter: Payer: Self-pay | Admitting: Obstetrics and Gynecology

## 2020-10-04 ENCOUNTER — Telehealth (INDEPENDENT_AMBULATORY_CARE_PROVIDER_SITE_OTHER): Payer: Medicaid Other | Admitting: Obstetrics and Gynecology

## 2020-10-04 VITALS — BP 94/72 | HR 78 | Ht 62.0 in | Wt 169.0 lb

## 2020-10-04 DIAGNOSIS — Z9079 Acquired absence of other genital organ(s): Secondary | ICD-10-CM

## 2020-10-04 DIAGNOSIS — Z3009 Encounter for other general counseling and advice on contraception: Secondary | ICD-10-CM

## 2020-10-04 NOTE — Progress Notes (Signed)
   TELEHEALTH GYNECOLOGY VISIT ENCOUNTER NOTE  Provider location: [facility] [employer] [facility]   I connected with Ashlee Simmons on 10/04/20 at  3:55 PM EST by telephone at home and verified that I am speaking with the correct person using two identifiers. Patient was unable to do MyChart audiovisual encounter due to technical difficulties, she tried several times.    I discussed the limitations, risks, security and privacy concerns of performing an evaluation and management service by telephone and the availability of in person appointments. I also discussed with the patient that there may be a patient responsible charge related to this service. The patient expressed understanding and agreed to proceed.   History:  Ashlee Simmons is a 25 y.o. [phone] female being evaluated today for post op from Lap Bilateral salpingectomy. She denies any abnormal vaginal discharge, bleeding, pelvic pain or other concerns.       Past Medical History:  Diagnosis Date  . Anxiety   . Asthma   . Depression   . PTSD (post-traumatic stress disorder)    Past Surgical History:  Procedure Laterality Date  . FEMUR SURGERY     plate in pelvis, rods in each femur  . HIP SURGERY    . LAPAROSCOPIC BILATERAL SALPINGECTOMY Bilateral 09/05/2020   Procedure: LAPAROSCOPIC BILATERAL SALPINGECTOMY;  Surgeon: Hermina Staggers, MD;  Location: [facility];  Service: Gynecology;  Laterality: Bilateral;   site fallopian tube bilaterally  . MOUTH SURGERY     The following portions of the patient's history were reviewed and updated as appropriate: allergies, current medications, past family history, past medical history, past social history, past surgical history and problem list.      Review of Systems:  Pertinent items noted in HPI and remainder of comprehensive ROS otherwise negative.  Physical Exam:   General:  Alert, oriented and cooperative.   Mental Status: Normal mood and  affect perceived. Normal judgment and thought content.  Physical exam deferred due to nature of the encounter  Labs and Imaging No results found for this or any previous visit (from the past 336 hour(s)). No results found.    Assessment and Plan:     1. Unwanted fertility S/P bilateral salpingectom  2. History of bilateral salpingectomy        I discussed the assessment and treatment plan with the patient. The patient was provided an opportunity to ask questions and all were answered. The patient agreed with the plan and demonstrated an understanding of the instructions.   The patient was advised to call back or seek an in-person evaluation/go to the ED if the symptoms worsen or if the condition fails to improve as anticipated.  I provided 8 minutes of non-face-to-face time during this encounter.   Hermina Staggers, MD [facility], [facility]

## 2020-10-05 ENCOUNTER — Telehealth: Payer: Self-pay | Admitting: Clinical

## 2020-10-05 NOTE — Telephone Encounter (Cosign Needed)
Intern called Pt to reschedule appt on 10/08/20 due to inclement weather. Pt was rescheduled for 10/15/20 at 2:15PM and informed that the visit would be virtual.   Bea Graff (Supervisor: Hulda Marin)

## 2020-10-09 NOTE — BH Specialist Note (Signed)
Integrated Behavioral Health via Telemedicine Visit  10/09/2020 Yurika Pereda [id number]   Pt did not arrive to video visit and did not answer the phone ; Left HIPPA-compliant message to call back Asher Muir from [facility] [employer] [facility] at [phone] (main office) or [phone] ([place]'s office).  ; left MyChart message for patient.    Valetta Close Aseem Sessums, LCSW

## 2020-10-15 ENCOUNTER — Ambulatory Visit: Payer: Medicaid Other | Admitting: Clinical

## 2020-10-15 DIAGNOSIS — Z5329 Procedure and treatment not carried out because of patient's decision for other reasons: Secondary | ICD-10-CM

## 2020-10-15 DIAGNOSIS — Z91199 Patient's noncompliance with other medical treatment and regimen due to unspecified reason: Secondary | ICD-10-CM

## 2020-12-04 ENCOUNTER — Other Ambulatory Visit: Payer: Self-pay

## 2020-12-04 ENCOUNTER — Encounter (HOSPITAL_COMMUNITY): Payer: Self-pay

## 2020-12-04 ENCOUNTER — Ambulatory Visit (HOSPITAL_COMMUNITY)
Admission: EM | Admit: 2020-12-04 | Discharge: 2020-12-04 | Disposition: A | Discharge status: Home / Self Care | Payer: Medicaid Other | Attending: Student | Admitting: Student

## 2020-12-04 DIAGNOSIS — Z9851 Tubal ligation status: Secondary | ICD-10-CM

## 2020-12-04 DIAGNOSIS — S29012A Strain of muscle and tendon of back wall of thorax, initial encounter: Secondary | ICD-10-CM | POA: Diagnosis not present

## 2020-12-04 DIAGNOSIS — Z9889 Other specified postprocedural states: Secondary | ICD-10-CM

## 2020-12-04 DIAGNOSIS — M940 Chondrocostal junction syndrome [Tietze]: Secondary | ICD-10-CM

## 2020-12-04 MED ORDER — PREDNISONE 20 MG PO TABS: 40.0000 mg | ORAL_TABLET | Freq: Every day | ORAL | 0 refills | Status: AC

## 2020-12-04 MED ORDER — NAPROXEN 500 MG PO TABS: 500.0000 mg | ORAL_TABLET | Freq: Two times a day (BID) | ORAL | 0 refills | Status: DC

## 2020-12-04 MED ORDER — TIZANIDINE HCL 2 MG PO CAPS: 4.0000 mg | ORAL_CAPSULE | Freq: Three times a day (TID) | ORAL | 0 refills | Status: DC

## 2020-12-04 MED ORDER — TIZANIDINE HCL 4 MG PO TABS: 4.0000 mg | ORAL_TABLET | Freq: Four times a day (QID) | ORAL | 0 refills | Status: DC | PRN

## 2020-12-04 NOTE — Discharge Instructions (Addendum)
-  Start the prednisone, 2 pills taken together in the morning for 5 days.  This will help the inflammation in your background. -Stop the Flexeril and start Zanaflex (tizanidine), 1 to 2 pills taken up to 3 times daily for muscle spasms.  This can make you drowsy, so do not take before driving or operating machinery. -Also take naproxen, up to 2 pills daily for muscular pain and inflammation.  Take this with food like with breakfast and dinner.  You can also take Tylenol while on this medication, but avoid taking other NSAIDs like ibuprofen and Aleve. -If you develop new symptoms like persistent numbness/weakness in your arms/legs, urinary symptoms, chest pain at rest-seek additional medical treatment.

## 2020-12-04 NOTE — ED Provider Notes (Signed)
[facility]    CSN: [id number] Arrival date & time: 12/04/20  1729      History   Chief Complaint Chief Complaint  Patient presents with  . Generalized Body Aches  . Chest Pain  . Back Pain    HPI Ashlee Simmons is a 25 y.o. female presenting with back pain. (triage note says body aches and chills but paitent adamantly denies this). History anxiety, asthma, depression, PTSD. States she's had chest and back pain x1 week.  Notes 1 week of thoracic paraspinous muscle tenderness, worse with movement.  Also notes sternal chest pain with deep inspiration.  Denies recent trauma or overuse, but does state she has multiple children that she lifts up throughout the day.  She has had back issues in the past related to epidural anesthesia.  Denies weakness or sensation changes in arms or legs, denies urinary symptoms, constipation, urinary retention. States she could not be pregnant as her tubes have been removed. Has tried flexeril with minimal improvement. Denies history cardiac disease.  HPI  Past Medical History:  Diagnosis Date  . Anxiety   . Asthma   . Depression   . PTSD (post-traumatic stress disorder)     Patient Active Problem List   Diagnosis Date Noted  . History of bilateral salpingectomy 10/04/2020  . Unwanted fertility 08/22/2020  . Urinary retention with incomplete bladder emptying 02/02/2020  . History of femur fracture 01/02/2020  . History of marijuana use 01/02/2020  . Hypothyroidism   . Obesity (BMI 30-39.9) 12/15/2019  . PTSD (post-traumatic stress disorder)   . Anxiety   . Depression   . Asthma   . Panic attacks 01/26/2016    Past Surgical History:  Procedure Laterality Date  . FEMUR SURGERY     plate in pelvis, rods in each femur  . HIP SURGERY    . LAPAROSCOPIC BILATERAL SALPINGECTOMY Bilateral 09/05/2020   Procedure: LAPAROSCOPIC BILATERAL SALPINGECTOMY;  Surgeon: Hermina Staggers, MD;  Location: [facility];  Service:  Gynecology;  Laterality: Bilateral;   site fallopian tube bilaterally  . MOUTH SURGERY      OB History    Gravida  3   Para  3   Term  3   Preterm      AB      Living  3     SAB      IAB      Ectopic      Multiple  0   Live Births  3            Home Medications    Prior to Admission medications   Medication Sig Start Date End Date Taking? Authorizing Provider  naproxen (NAPROSYN) 500 MG tablet Take 1 tablet (500 mg total) by mouth 2 (two) times daily. 12/04/20  Yes Rhys Martini, PA-C  predniSONE (DELTASONE) 20 MG tablet Take 2 tablets (40 mg total) by mouth daily for 5 days. 12/04/20 12/09/20 Yes Rhys Martini, PA-C  tizanidine (ZANAFLEX) 2 MG capsule Take 2 capsules (4 mg total) by mouth 3 (three) times daily. Take 1-2 capsules for muscle spasms, up to 3x daily. This can cause drowsiness. 12/04/20  Yes Rhys Martini, PA-C  albuterol (VENTOLIN HFA) 108 (90 Base) MCG/ACT inhaler Inhale 1-2 puffs into the lungs every 6 (six) hours as needed for wheezing or shortness of breath. 03/07/20   [place] Bing, MD  ibuprofen (ADVIL) 800 MG tablet Take 1 tablet (800 mg total) by mouth every 8 (eight)  hours as needed. 09/05/20   Hermina StaggersErvin, Michael L, MD  sertraline (ZOLOFT) 50 MG tablet Take 1 tablet (50 mg total) by mouth daily. 08/14/20   Venora MaplesEckstat, Matthew M, MD    Family History Family History  Problem Relation Age of Onset  . COPD Maternal Grandfather   . Thyroid disease Mother   . Thyroid disease Maternal Grandmother   . Alcohol abuse Neg Hx   . Arthritis Neg Hx   . Asthma Neg Hx   . Birth defects Neg Hx   . Cancer Neg Hx   . Depression Neg Hx   . Diabetes Neg Hx   . Drug abuse Neg Hx   . Early death Neg Hx   . Hearing loss Neg Hx   . Heart disease Neg Hx   . Hyperlipidemia Neg Hx   . Hypertension Neg Hx   . Kidney disease Neg Hx   . Learning disabilities Neg Hx   . Mental illness Neg Hx   . Mental retardation Neg Hx   . Miscarriages / Stillbirths Neg Hx    . Stroke Neg Hx   . Vision loss Neg Hx   . Varicose Veins Neg Hx   . Intellectual disability Neg Hx   . Obesity Neg Hx   . ADD / ADHD Neg Hx   . Anxiety disorder Neg Hx     Social History Social History   Tobacco Use  . Smoking status: Never Smoker  . Smokeless tobacco: Former Clinical biochemistUser  Vaping Use  . Vaping Use: Never used  Substance Use Topics  . Alcohol use: Not Currently    Comment: occ  . Drug use: Not Currently    Types: Marijuana    Comment: none with pregnancy     Allergies   Patient has no known allergies.   Review of Systems Review of Systems  Constitutional: Negative for appetite change, chills and fever.  HENT: Negative for congestion, ear pain, rhinorrhea, sinus pressure, sinus pain and sore throat.   Eyes: Negative for redness and visual disturbance.  Respiratory: Negative for cough, chest tightness, shortness of breath and wheezing.   Cardiovascular: Negative for chest pain and palpitations.  Gastrointestinal: Negative for abdominal pain, constipation, diarrhea, nausea and vomiting.  Genitourinary: Negative for dysuria, frequency and urgency.  Musculoskeletal: Positive for back pain. Negative for myalgias.  Neurological: Negative for dizziness, weakness and headaches.  Psychiatric/Behavioral: Negative for confusion.  All other systems reviewed and are negative.    Physical Exam Triage Vital Signs ED Triage Vitals  Enc Vitals Group     BP 12/04/20 1803 109/68     Pulse Rate 12/04/20 1803 (!) 103     Resp 12/04/20 1803 18     Temp 12/04/20 1803 98.1 F (36.7 C)     Temp Source 12/04/20 1803 Oral     SpO2 12/04/20 1803 99 %     Weight --      Height --      Head Circumference --      Peak Flow --      Pain Score 12/04/20 1801 9     Pain Loc --      Pain Edu? --      Excl. in GC? --    No data found.  Updated Vital Signs BP 109/68 (BP Location: Right Arm)   Pulse (!) 103   Temp 98.1 F (36.7 C) (Oral)   Resp 18   LMP 11/07/2020 (Exact  Date)   SpO2 99%  Visual Acuity Right Eye Distance:   Left Eye Distance:   Bilateral Distance:    Right Eye Near:   Left Eye Near:    Bilateral Near:     Physical Exam Vitals reviewed.  Constitutional:      General: She is not in acute distress.    Appearance: Normal appearance. She is not ill-appearing.  HENT:     Head: Normocephalic and atraumatic.     Right Ear: Hearing, tympanic membrane, ear canal and external ear normal. No swelling or tenderness. There is no impacted cerumen. No mastoid tenderness. Tympanic membrane is not perforated, erythematous, retracted or bulging.     Left Ear: Hearing, tympanic membrane, ear canal and external ear normal. No swelling or tenderness. There is no impacted cerumen. No mastoid tenderness. Tympanic membrane is not perforated, erythematous, retracted or bulging.     Nose:     Right Sinus: No maxillary sinus tenderness or frontal sinus tenderness.     Left Sinus: No maxillary sinus tenderness or frontal sinus tenderness.     Mouth/Throat:     Mouth: Mucous membranes are moist.     Pharynx: Uvula midline. No oropharyngeal exudate or posterior oropharyngeal erythema.     Tonsils: No tonsillar exudate.  Cardiovascular:     Rate and Rhythm: Normal rate and regular rhythm.     Heart sounds: Normal heart sounds.  Pulmonary:     Breath sounds: Normal breath sounds and air entry. No decreased breath sounds, wheezing, rhonchi or rales.  Chest:     Chest wall: Tenderness present.     Comments: Sternal chest pain elicited with deep inspiration Abdominal:     General: Abdomen is flat. Bowel sounds are normal.     Tenderness: There is no abdominal tenderness. There is no guarding or rebound.  Musculoskeletal:     Cervical back: Normal. No swelling, edema, deformity, erythema, signs of trauma, lacerations, rigidity, spasms, torticollis, tenderness, bony tenderness or crepitus. No pain with movement. Normal range of motion.     Thoracic back: Spasms  and tenderness present. No swelling, edema, deformity, signs of trauma or bony tenderness. Normal range of motion. No scoliosis.     Lumbar back: Normal. No swelling, edema, deformity, signs of trauma, lacerations, spasms, tenderness or bony tenderness. Normal range of motion. Negative right straight leg raise test and negative left straight leg raise test. No scoliosis.     Comments: Thoracic paraspinous muscle tenderness elicited with flexion thoracic spine Gait normal  Strength 5/5 in UEs and LEs, sensation intact.  CN 2-12 grossly intact  Lymphadenopathy:     Cervical: No cervical adenopathy.  Neurological:     General: No focal deficit present.     Mental Status: She is alert and oriented to person, place, and time.  Psychiatric:        Attention and Perception: Attention and perception normal.        Mood and Affect: Mood and affect normal.        Behavior: Behavior normal. Behavior is cooperative.        Thought Content: Thought content normal.        Judgment: Judgment normal.      UC Treatments / Results  Labs (all labs ordered are listed, but only abnormal results are displayed) Labs Reviewed - No data to display  EKG   Radiology No results found.  Procedures Procedures (including critical care time)  Medications Ordered in UC Medications - No data to display  Initial Impression /  Assessment and Plan / UC Course  I have reviewed the triage vital signs and the nursing notes.  Pertinent labs & imaging results that were available during my care of the patient were reviewed by me and considered in my medical decision making (see chart for details).      This patient is a 25 year old female presenting with thoracic strain and costochondritis. Today this pt is mildly tachcardic but afebrile nontachypneic, oxygenating well on room air, no wheezes rhonchi or rales.   She is not pregnant or breastfeeding. For thoracic strain and costochondritis- Zanaflex,  prednisone, naproxen as below. Heat, ROM exercises. Return precautions discussed.  This chart was dictated using voice recognition software, Dragon. Despite the best efforts of this provider to proofread and correct errors, errors may still occur which can change documentation meaning.   Final Clinical Impressions(s) / UC Diagnoses   Final diagnoses:  Strain of thoracic back region  History of epidural anesthesia  Costochondritis  History of bilateral tubal ligation     Discharge Instructions     -Start the prednisone, 2 pills taken together in the morning for 5 days.  This will help the inflammation in your background. -Stop the Flexeril and start Zanaflex (tizanidine), 1 to 2 pills taken up to 3 times daily for muscle spasms.  This can make you drowsy, so do not take before driving or operating machinery. -Also take naproxen, up to 2 pills daily for muscular pain and inflammation.  Take this with food like with breakfast and dinner.  You can also take Tylenol while on this medication, but avoid taking other NSAIDs like ibuprofen and Aleve. -If you develop new symptoms like persistent numbness/weakness in your arms/legs, urinary symptoms, chest pain at rest-seek additional medical treatment.    ED Prescriptions    Medication Sig Dispense Auth. Provider   tizanidine (ZANAFLEX) 2 MG capsule Take 2 capsules (4 mg total) by mouth 3 (three) times daily. Take 1-2 capsules for muscle spasms, up to 3x daily. This can cause drowsiness. 21 capsule Rhys Martini, PA-C   predniSONE (DELTASONE) 20 MG tablet Take 2 tablets (40 mg total) by mouth daily for 5 days. 10 tablet Rhys Martini, PA-C   naproxen (NAPROSYN) 500 MG tablet Take 1 tablet (500 mg total) by mouth 2 (two) times daily. 30 tablet Rhys Martini, PA-C     PDMP not reviewed this encounter.   Rhys Martini, PA-C 12/04/20 1839

## 2020-12-04 NOTE — ED Triage Notes (Signed)
Pt c/o chest and back pain X 1 week. Pt states she has generalized body aches .Pt states she has been having a habit of cracking her hands and feet. Pt denies coughing.

## 2020-12-05 ENCOUNTER — Telehealth (HOSPITAL_COMMUNITY): Payer: Self-pay | Admitting: Emergency Medicine

## 2020-12-05 MED ORDER — CYCLOBENZAPRINE HCL 10 MG PO TABS: 10.0000 mg | ORAL_TABLET | Freq: Three times a day (TID) | ORAL | 0 refills | Status: DC

## 2020-12-05 NOTE — Telephone Encounter (Signed)
Patient called after going to pharmacy and finding out that Tizanidine is not covered by her insurance.  She is requesting Flexeril, but at a higher dose than previous prescription, so it will be covered.   Reviewed with Dr. Tracie Harrier and he approved "Rx flexeril 10mg  TID #21/0".

## 2021-05-21 ENCOUNTER — Ambulatory Visit (HOSPITAL_COMMUNITY)
Admission: EM | Admit: 2021-05-21 | Discharge: 2021-05-21 | Disposition: A | Discharge status: Home / Self Care | Payer: Medicaid Other | Attending: Emergency Medicine | Admitting: Emergency Medicine

## 2021-05-21 ENCOUNTER — Encounter (HOSPITAL_COMMUNITY): Payer: Self-pay | Admitting: *Deleted

## 2021-05-21 ENCOUNTER — Other Ambulatory Visit: Payer: Self-pay

## 2021-05-21 DIAGNOSIS — M546 Pain in thoracic spine: Secondary | ICD-10-CM

## 2021-05-21 DIAGNOSIS — G8929 Other chronic pain: Secondary | ICD-10-CM

## 2021-05-21 MED ORDER — NAPROXEN 500 MG PO TABS: 500.0000 mg | ORAL_TABLET | Freq: Two times a day (BID) | ORAL | 0 refills | Status: AC

## 2021-05-21 NOTE — Discharge Instructions (Addendum)
Will need to follow up with ortho  Needs to have further x ray or scans  Take meds as needed  Use heating pad as needed for pain

## 2021-05-21 NOTE — ED Provider Notes (Signed)
[facility]    CSN: [id number] Arrival date & time: 05/21/21  6712      History   Chief Complaint Chief Complaint  Patient presents with   Back Pain    HPI Ashlee Simmons is a 25 y.o. female.   Chronic lower back pain since having epidural with child. States that she was seen for this but it getting worse. Has not seen ortho for this. Pt is taking naproxen that helps intermit . Does not want to take steroid due to reaction of eating a lot. No loss of urine. Able to walk into room.     Past Medical History:  Diagnosis Date   Anxiety    Asthma    Depression    PTSD (post-traumatic stress disorder)     Patient Active Problem List   Diagnosis Date Noted   History of bilateral salpingectomy 10/04/2020   Unwanted fertility 08/22/2020   Urinary retention with incomplete bladder emptying 02/02/2020   History of femur fracture 01/02/2020   History of marijuana use 01/02/2020   Hypothyroidism    Obesity (BMI 30-39.9) 12/15/2019   PTSD (post-traumatic stress disorder)    Anxiety    Depression    Asthma    Panic attacks 01/26/2016    Past Surgical History:  Procedure Laterality Date   FEMUR SURGERY     plate in pelvis, rods in each femur   HIP SURGERY     LAPAROSCOPIC BILATERAL SALPINGECTOMY Bilateral 09/05/2020   Procedure: LAPAROSCOPIC BILATERAL SALPINGECTOMY;  Surgeon: Hermina Staggers, MD;  Location: [facility];  Service: Gynecology;  Laterality: Bilateral;   site fallopian tube bilaterally   MOUTH SURGERY      OB History     Gravida  3   Para  3   Term  3   Preterm      AB      Living  3      SAB      IAB      Ectopic      Multiple  0   Live Births  3            Home Medications    Prior to Admission medications   Medication Sig Start Date End Date Taking? Authorizing Provider  albuterol (VENTOLIN HFA) 108 (90 Base) MCG/ACT inhaler Inhale 1-2 puffs into the lungs every 6 (six) hours as needed for wheezing  or shortness of breath. 03/07/20   [place] Bing, MD  cyclobenzaprine (FLEXERIL) 10 MG tablet Take 1 tablet (10 mg total) by mouth 3 (three) times daily. 12/05/20   Mardella Layman, MD  ibuprofen (ADVIL) 800 MG tablet Take 1 tablet (800 mg total) by mouth every 8 (eight) hours as needed. 09/05/20   Hermina Staggers, MD  naproxen (NAPROSYN) 500 MG tablet Take 1 tablet (500 mg total) by mouth 2 (two) times daily. 05/21/21   Coralyn Mark, NP  sertraline (ZOLOFT) 50 MG tablet Take 1 tablet (50 mg total) by mouth daily. 08/14/20   Venora Maples, MD  tiZANidine (ZANAFLEX) 4 MG tablet Take 1 tablet (4 mg total) by mouth every 6 (six) hours as needed for muscle spasms. 12/04/20   Rhys Martini, PA-C    Family History Family History  Problem Relation Age of Onset   Thyroid disease Mother    Thyroid disease Maternal Grandmother    COPD Maternal Grandfather    Alcohol abuse Neg Hx    Arthritis Neg Hx    Asthma  Neg Hx    Birth defects Neg Hx    Cancer Neg Hx    Depression Neg Hx    Diabetes Neg Hx    Drug abuse Neg Hx    Early death Neg Hx    Hearing loss Neg Hx    Heart disease Neg Hx    Hyperlipidemia Neg Hx    Hypertension Neg Hx    Kidney disease Neg Hx    Learning disabilities Neg Hx    Mental illness Neg Hx    Mental retardation Neg Hx    Miscarriages / Stillbirths Neg Hx    Stroke Neg Hx    Vision loss Neg Hx    Varicose Veins Neg Hx    Intellectual disability Neg Hx    Obesity Neg Hx    ADD / ADHD Neg Hx    Anxiety disorder Neg Hx     Social History Social History   Tobacco Use   Smoking status: Never   Smokeless tobacco: Former  Building services engineer Use: Never used  Substance Use Topics   Alcohol use: Not Currently    Comment: occ   Drug use: Not Currently    Types: Marijuana    Comment: none with pregnancy     Allergies   Patient has no known allergies.   Review of Systems Review of Systems  Constitutional: Negative.   Respiratory: Negative.     Cardiovascular: Negative.   Gastrointestinal: Negative.   Genitourinary: Negative.   Musculoskeletal:  Positive for back pain.       Mid back area   Neurological: Negative.     Physical Exam Triage Vital Signs ED Triage Vitals  Enc Vitals Group     BP 05/21/21 0858 123/84     Pulse Rate 05/21/21 0858 76     Resp 05/21/21 0858 18     Temp 05/21/21 0858 99 F (37.2 C)     Temp src --      SpO2 05/21/21 0858 97 %     Weight --      Height --      Head Circumference --      Peak Flow --      Pain Score 05/21/21 0856 10     Pain Loc --      Pain Edu? --      Excl. in GC? --    No data found.  Updated Vital Signs BP 123/84   Pulse 76   Temp 99 F (37.2 C)   Resp 18   LMP 05/04/2021   SpO2 97%   Visual Acuity Right Eye Distance:   Left Eye Distance:   Bilateral Distance:    Right Eye Near:   Left Eye Near:    Bilateral Near:     Physical Exam Constitutional:      Appearance: Normal appearance.  Cardiovascular:     Rate and Rhythm: Normal rate.  Abdominal:     General: Abdomen is flat.  Musculoskeletal:        General: Tenderness present. No signs of injury.     Cervical back: Normal range of motion.     Right lower leg: No edema.     Left lower leg: No edema.     Comments: Midline lumbar tenderness on palpation, full ROM with flexing. Denies numbness and tingling to extremities   Skin:    General: Skin is warm and dry.  Neurological:     General: No focal deficit present.  Mental Status: She is alert.     UC Treatments / Results  Labs (all labs ordered are listed, but only abnormal results are displayed) Labs Reviewed - No data to display  EKG   Radiology No results found.  Procedures Procedures (including critical care time)  Medications Ordered in UC Medications - No data to display  Initial Impression / Assessment and Plan / UC Course  I have reviewed the triage vital signs and the nursing notes.  Pertinent labs & imaging  results that were available during my care of the patient were reviewed by me and considered in my medical decision making (see chart for details).     Will need to follow up with ortho or a pain clinic  Needs to have further x ray or scans  Take meds as needed  Use heating pad as needed for pain  Final Clinical Impressions(s) / UC Diagnoses   Final diagnoses:  Chronic midline thoracic back pain     Discharge Instructions      Will need to follow up with ortho  Needs to have further x ray or scans  Take meds as needed  Use heating pad as needed for pain      ED Prescriptions     Medication Sig Dispense Auth. Provider   naproxen (NAPROSYN) 500 MG tablet Take 1 tablet (500 mg total) by mouth 2 (two) times daily. 30 tablet Coralyn Mark, NP      PDMP not reviewed this encounter.   Coralyn Mark, NP 05/21/21 1007

## 2021-05-21 NOTE — ED Triage Notes (Signed)
Pt reports on going back pain.

## 2021-10-21 DIAGNOSIS — R0602 Shortness of breath: Secondary | ICD-10-CM | POA: Diagnosis not present

## 2021-10-21 DIAGNOSIS — R002 Palpitations: Secondary | ICD-10-CM | POA: Insufficient documentation

## 2021-10-21 DIAGNOSIS — R0789 Other chest pain: Secondary | ICD-10-CM | POA: Insufficient documentation

## 2021-10-21 DIAGNOSIS — Z5321 Procedure and treatment not carried out due to patient leaving prior to being seen by health care provider: Secondary | ICD-10-CM | POA: Diagnosis not present

## 2021-10-22 ENCOUNTER — Emergency Department (HOSPITAL_COMMUNITY): Payer: Medicaid Other

## 2021-10-22 ENCOUNTER — Other Ambulatory Visit: Payer: Self-pay

## 2021-10-22 ENCOUNTER — Emergency Department (HOSPITAL_COMMUNITY)
Admission: EM | Admit: 2021-10-22 | Discharge: 2021-10-22 | Disposition: A | Discharge status: Home / Self Care | Payer: Medicaid Other | Attending: Emergency Medicine | Admitting: Emergency Medicine

## 2021-10-22 ENCOUNTER — Encounter (HOSPITAL_COMMUNITY): Payer: Self-pay | Admitting: Emergency Medicine

## 2021-10-22 LAB — COMPREHENSIVE METABOLIC PANEL
ALT: 21 U/L (ref 0–44)
AST: 24 U/L (ref 15–41)
Albumin: 3.8 g/dL (ref 3.5–5.0)
Alkaline Phosphatase: 71 U/L (ref 38–126)
Anion gap: 7 (ref 5–15)
BUN: 8 mg/dL (ref 6–20)
CO2: 21 mmol/L — ABNORMAL LOW (ref 22–32)
Calcium: 8.6 mg/dL — ABNORMAL LOW (ref 8.9–10.3)
Chloride: 107 mmol/L (ref 98–111)
Creatinine, Ser: 0.69 mg/dL (ref 0.44–1.00)
GFR, Estimated: >60 mL/min (ref >60)
Glucose, Bld: 101 mg/dL — ABNORMAL HIGH (ref 70–99)
Potassium: 3.5 mmol/L (ref 3.5–5.1)
Sodium: 135 mmol/L (ref 135–145)
Total Bilirubin: 0.8 mg/dL (ref 0.3–1.2)
Total Protein: 6.3 g/dL — ABNORMAL LOW (ref 6.5–8.1)

## 2021-10-22 LAB — CBC WITH DIFFERENTIAL/PLATELET
Abs Immature Granulocytes: 0.02 10*3/uL (ref 0.00–0.07)
Basophils Absolute: 0 10*3/uL (ref 0.0–0.1)
Basophils Relative: 0 %
Eosinophils Absolute: 0.1 10*3/uL (ref 0.0–0.5)
Eosinophils Relative: 1 %
HCT: 40.8 % (ref 36.0–46.0)
Hemoglobin: 14.2 g/dL (ref 12.0–15.0)
Immature Granulocytes: 0 %
Lymphocytes Relative: 16 %
Lymphs Abs: 0.8 10*3/uL (ref 0.7–4.0)
MCH: 30.6 pg (ref 26.0–34.0)
MCHC: 34.8 g/dL (ref 30.0–36.0)
MCV: 87.9 fL (ref 80.0–100.0)
Monocytes Absolute: 0.6 10*3/uL (ref 0.1–1.0)
Monocytes Relative: 11 %
Neutro Abs: 3.7 10*3/uL (ref 1.7–7.7)
Neutrophils Relative %: 72 %
Platelets: 150 10*3/uL (ref 150–400)
RBC: 4.64 MIL/uL (ref 3.87–5.11)
RDW: 11.7 % (ref 11.5–15.5)
WBC: 5.2 10*3/uL (ref 4.0–10.5)
nRBC: 0 % (ref 0.0–0.2)

## 2021-10-22 LAB — MAGNESIUM: Magnesium: 1.7 mg/dL (ref 1.7–2.4)

## 2021-10-22 LAB — I-STAT BETA HCG BLOOD, ED (MC, WL, AP ONLY): I-stat hCG, quantitative: <5 m[IU]/mL (ref <5)

## 2021-10-22 LAB — TROPONIN I (HIGH SENSITIVITY): Troponin I (High Sensitivity): <2 ng/L (ref <18)

## 2021-10-22 NOTE — ED Provider Triage Note (Signed)
Emergency Medicine Provider Triage Evaluation Note  Ashlee Simmons , a 26 y.o. female  was evaluated in triage.  Pt complains of chest pain.  Patient states she has been experiencing intermittent chest pain, chest tightness, palpitations, and shortness of breath for many months.  Feels that her symptoms are becoming more frequent.  Reports intermittent marijuana use but denies any other drug use.  Denies any frequent alcohol use.  Physical Exam  BP 120/78    Pulse (!) 108    Temp 98.8 F (37.1 C) (Oral)    Resp 16    SpO2 100%  Gen:   Awake, no distress   Resp:  Normal effort  MSK:   Moves extremities without difficulty  Other:    Medical Decision Making  Medically screening exam initiated at 12:21 AM.  Appropriate orders placed.  Ashlee Simmons was informed that the remainder of the evaluation will be completed by another provider, this initial triage assessment does not replace that evaluation, and the importance of remaining in the ED until their evaluation is complete.   Rayna Sexton, PA-C 10/22/21 0022

## 2021-10-22 NOTE — ED Notes (Signed)
Patient left on own accord °

## 2021-10-22 NOTE — ED Triage Notes (Signed)
Pt reports chest palpitations and pain Xmonths but much more recently "they don't stop."  She states they "kind of feel like panic attacks."

## 2021-12-13 ENCOUNTER — Other Ambulatory Visit: Payer: Self-pay

## 2021-12-13 ENCOUNTER — Ambulatory Visit (HOSPITAL_COMMUNITY)
Admission: EM | Admit: 2021-12-13 | Discharge: 2021-12-13 | Disposition: A | Discharge status: Home / Self Care | Payer: Medicaid Other | Attending: Internal Medicine | Admitting: Internal Medicine

## 2021-12-13 ENCOUNTER — Ambulatory Visit (INDEPENDENT_AMBULATORY_CARE_PROVIDER_SITE_OTHER): Payer: Medicaid Other

## 2021-12-13 ENCOUNTER — Encounter (HOSPITAL_COMMUNITY): Payer: Self-pay

## 2021-12-13 DIAGNOSIS — M25572 Pain in left ankle and joints of left foot: Secondary | ICD-10-CM | POA: Diagnosis not present

## 2021-12-13 DIAGNOSIS — R21 Rash and other nonspecific skin eruption: Secondary | ICD-10-CM | POA: Diagnosis not present

## 2021-12-13 DIAGNOSIS — R002 Palpitations: Secondary | ICD-10-CM

## 2021-12-13 DIAGNOSIS — F411 Generalized anxiety disorder: Secondary | ICD-10-CM

## 2021-12-13 MED ORDER — TRIAMCINOLONE ACETONIDE 0.1 % EX CREA: 1.0000 "application " | TOPICAL_CREAM | Freq: Two times a day (BID) | CUTANEOUS | 0 refills | Status: AC

## 2021-12-13 NOTE — ED Triage Notes (Signed)
Pt reports that she woke up this morning with a pruritic rash on her BUE. She thinks the rash may be anxiety related. No ointments applied. No new products used. Pt also reports that her left ankle feels like it intermittently "pops out of place". ? ?

## 2021-12-13 NOTE — Discharge Instructions (Signed)
Please follow-up with provided addresses for specialties for further evaluation and management.  [facility] is provided which is a 24-hour service so you may leave here and go receive treatment and evaluation for anxiety.  Follow-up if symptoms persist or worsen. ?
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
## 2022-01-28 ENCOUNTER — Telehealth: Payer: Medicaid Other | Admitting: Family Medicine

## 2022-01-28 ENCOUNTER — Telehealth: Payer: Medicaid Other | Admitting: Physician Assistant

## 2022-01-28 DIAGNOSIS — R42 Dizziness and giddiness: Secondary | ICD-10-CM

## 2022-01-28 DIAGNOSIS — U071 COVID-19: Secondary | ICD-10-CM

## 2022-01-28 DIAGNOSIS — R112 Nausea with vomiting, unspecified: Secondary | ICD-10-CM

## 2022-01-28 DIAGNOSIS — R0602 Shortness of breath: Secondary | ICD-10-CM

## 2022-01-28 NOTE — Progress Notes (Signed)
Based on what you shared with me, I feel your condition warrants further evaluation and I recommend that you be seen in a face to face visit. Giving your vomiting and diarrhea along with the frequent shortness of breath with COVID, you need to be evaluated in person at a local Urgent Care so you can get a detailed examination and so the correct treatments can be started for you.  ?  ?NOTE: There will be NO CHARGE for this eVisit ?  ?If you are having a true medical emergency please call 911.   ?  ? For an urgent face to face visit, [facility] has six urgent care centers for your convenience:  ?  ? [facility] ?Get Driving Directions ?[phone] ?[phone] [address] ?[place], [place] [address] ?  ? [facility]) ?Get Driving Directions ?[phone] ?[address] ?[place], [place] [address] ? ?[facility]) ?Get Driving Directions ?[phone] ?[address] ?[place],  [place]  [address] ? ?[facility] ?Get Driving Directions ?[phone] ?[address] ?[place], [place] [address] ?  ?[facility] [place] ?Get Driving Directions  ?[phone] ?[address] ?[address] ?[place], [place] [address] ?  ? Urgent Care at [facility] ?Get Driving Directions ?[phone] ?[address] ?[place], [place] [address] ? ?Your MyChart E-visit questionnaire answers were reviewed by a board certified advanced clinical practitioner to complete your personal care plan based on your specific symptoms.  Thank you for using e-Visits. ?  ? ?

## 2022-01-28 NOTE — Progress Notes (Signed)
[facility]  ? ?Pt reports dizziness, on going diarrhea, and nausea. ON EV reports of [place] PA sent message about in person need. While pt on VV- I explained the need for in person. ? ?Patient acknowledged agreement and understanding of the plan.  ? ?

## 2022-01-28 NOTE — Patient Instructions (Signed)
°  Based on what you shared with me, I feel your condition warrants further evaluation and I recommend that you be seen in a face to face visit. °  °NOTE: There will be NO CHARGE for this eVisit °  °If you are having a true medical emergency please call 911.   °  ° For an urgent face to face visit, [facility] has six urgent care centers for your convenience:  °  ° [facility] [place] °Get Driving Directions °[phone] °[address] °[place], [place] [address] °  ° [facility] ([place]) °Get Driving Directions °[phone] °[address] °[place], [place] [address] ° °[facility] [place]) °Get Driving Directions °[phone] °[address] °[place],  [place]  [address] ° °[facility] °Get Driving Directions °[phone] °[address] °[place], [place] [address] °  °[facility] [place] °Get Driving Directions  °[phone] °[address] °[address] °[place], [place] [address] °  °[facility] [place] °Get Driving Directions °[phone] °[address] °[place], [place] [address] ° °Your MyChart E-visit questionnaire answers were reviewed by a board certified advanced clinical practitioner to complete your personal care plan based on your specific symptoms.  Thank you for using e-Visits. °

## 2022-04-14 ENCOUNTER — Encounter: Payer: Self-pay | Admitting: Plastic Surgery

## 2022-04-14 ENCOUNTER — Ambulatory Visit (INDEPENDENT_AMBULATORY_CARE_PROVIDER_SITE_OTHER): Payer: Medicaid Other | Admitting: Plastic Surgery

## 2022-04-14 VITALS — BP 119/85 | HR 73 | Ht 62.0 in | Wt 171.4 lb

## 2022-04-14 DIAGNOSIS — M542 Cervicalgia: Secondary | ICD-10-CM | POA: Diagnosis not present

## 2022-04-14 DIAGNOSIS — M545 Low back pain, unspecified: Secondary | ICD-10-CM | POA: Diagnosis not present

## 2022-04-14 DIAGNOSIS — Z6831 Body mass index (BMI) 31.0-31.9, adult: Secondary | ICD-10-CM

## 2022-04-14 DIAGNOSIS — N62 Hypertrophy of breast: Secondary | ICD-10-CM | POA: Diagnosis not present

## 2022-04-14 DIAGNOSIS — G8929 Other chronic pain: Secondary | ICD-10-CM

## 2022-04-14 DIAGNOSIS — M546 Pain in thoracic spine: Secondary | ICD-10-CM | POA: Diagnosis not present

## 2022-04-14 NOTE — Progress Notes (Signed)
Referring Provider No referring provider defined for this encounter.   CC:  Breast hypertrophy and back pain.   Ashlee Simmons is an 26 y.o. female.  HPI:   The patient is a 26 y.o. female with a history of mammary hyperplasia for several years.  She has extremely large breasts causing symptoms that include the following: Back pain in the upper and lower back, including neck pain. She pulls or pins her bra straps to provide better lift and relief of the pressure and pain. She notices relief by holding her breast up manually.  Her shoulder straps cause grooves and pain and pressure that requires padding for relief. Pain medication is sometimes required with motrin and tylenol.  Activities that are hindered by enlarged breasts include: exercise and running.  She has tried supportive clothing as well as fitted bras without improvement.     Mammogram history: 5 years ago, normal.  Family history of breast cancer:  none.  Tobacco use:  none.   The patient expresses the desire to pursue surgical intervention.  She has performed some physical therapy in the past and is currently going to a chiropractor for her back pain.  The BMI = 31.  Preoperative bra size = DDD cup.   No Known Allergies  Outpatient Encounter Medications as of 04/14/2022  Medication Sig   albuterol (VENTOLIN HFA) 108 (90 Base) MCG/ACT inhaler Inhale 1-2 puffs into the lungs every 6 (six) hours as needed for wheezing or shortness of breath.   cyclobenzaprine (FLEXERIL) 10 MG tablet Take 1 tablet (10 mg total) by mouth 3 (three) times daily.   ibuprofen (ADVIL) 800 MG tablet Take 1 tablet (800 mg total) by mouth every 8 (eight) hours as needed.   naproxen (NAPROSYN) 500 MG tablet Take 1 tablet (500 mg total) by mouth 2 (two) times daily.   sertraline (ZOLOFT) 50 MG tablet Take 1 tablet (50 mg total) by mouth daily.   tiZANidine (ZANAFLEX) 4 MG tablet Take 1 tablet (4 mg total) by mouth every 6 (six) hours as needed for muscle  spasms.   triamcinolone cream (KENALOG) 0.1 % Apply 1 application. topically 2 (two) times daily.   No facility-administered encounter medications on file as of 04/14/2022.     Past Medical History:  Diagnosis Date   Anxiety    Asthma    Depression    PTSD (post-traumatic stress disorder)     Past Surgical History:  Procedure Laterality Date   FEMUR SURGERY     plate in pelvis, rods in each femur   HIP SURGERY     LAPAROSCOPIC BILATERAL SALPINGECTOMY Bilateral 09/05/2020   Procedure: LAPAROSCOPIC BILATERAL SALPINGECTOMY;  Surgeon: Hermina Staggers, MD;  Location: [facility];  Service: Gynecology;  Laterality: Bilateral;   site fallopian tube bilaterally   MOUTH SURGERY      Family History  Problem Relation Age of Onset   Thyroid disease Mother    Thyroid disease Maternal Grandmother    COPD Maternal Grandfather    Alcohol abuse Neg Hx    Arthritis Neg Hx    Asthma Neg Hx    Birth defects Neg Hx    Cancer Neg Hx    Depression Neg Hx    Diabetes Neg Hx    Drug abuse Neg Hx    Early death Neg Hx    Hearing loss Neg Hx    Heart disease Neg Hx    Hyperlipidemia Neg Hx    Hypertension Neg Hx  Kidney disease Neg Hx    Learning disabilities Neg Hx    Mental illness Neg Hx    Mental retardation Neg Hx    Miscarriages / Stillbirths Neg Hx    Stroke Neg Hx    Vision loss Neg Hx    Varicose Veins Neg Hx    Intellectual disability Neg Hx    Obesity Neg Hx    ADD / ADHD Neg Hx    Anxiety disorder Neg Hx     Social History   Social History Narrative   Not on file     Review of Systems General: Denies fevers, chills, weight loss CV: Denies chest pain, shortness of breath, palpitations   Physical Exam    04/14/2022    9:33 AM 12/13/2021   10:10 AM 10/22/2021   12:35 AM  Vitals with BMI  Height 5\' 2"   5\' 2"   Weight 171 lbs 6 oz  173 lbs  BMI 31.34  31.63  Systolic 119 117   Diastolic 85 79   Pulse 73 78     General:  No acute distress,   Alert and oriented, Non-Toxic, Normal speech and affect Breast: No easily palpable breast masses on physical exam, significant breast ptosis and macromastia. Her breasts are extremely large and fairly symmetric.  She has hyperpigmentation of the inframammary area on both sides.  The sternal to nipple distance on the right is 35 cm and the left is 35 cm.  The IMF distance is 17 cm on the right and 16 cm on the left.  Base width is 20 bilaterally. Assessment/Plan   The patient has bilateral symptomatic macromastia.  She is a good candidate for a breast reduction.  She is interested in pursuing surgical treatment.  She has tried supportive garments and fitted bras with no relief.  The details of breast reduction surgery were discussed.  I explained the procedure in detail along the with the expected scars.  The risks were discussed in detail and include bleeding, infection, damage to surrounding structures, need for additional procedures, nipple loss, change in nipple sensation, persistent pain, contour irregularities and asymmetries.  I explained that breast feeding is often not possible after breast reduction surgery.  We discussed the expected postoperative course with an overall recovery period of about 1 month.  She demonstrated full understanding of all risks.    The patient is interested in pursuing surgical treatment.  The estimated excess breast tissue to be removed at the time of surgery = greater than500 grams on the left and greater than 500 grams on the right. 04/14/2022, 10:38 AM

## 2022-05-21 ENCOUNTER — Telehealth: Payer: Self-pay | Admitting: *Deleted

## 2022-05-21 ENCOUNTER — Telehealth: Payer: Self-pay | Admitting: Plastic Surgery

## 2022-05-21 NOTE — Telephone Encounter (Signed)
Notified patient of denial, will work on a quote for her.

## 2022-05-21 NOTE — Telephone Encounter (Signed)
Called to check on insurance decision for surgery.  Please call back to give her an update.

## 2022-05-27 ENCOUNTER — Encounter: Payer: Self-pay | Admitting: *Deleted

## 2022-07-21 ENCOUNTER — Encounter (HOSPITAL_COMMUNITY): Payer: Self-pay

## 2022-07-21 ENCOUNTER — Emergency Department (HOSPITAL_COMMUNITY): Payer: Medicaid Other

## 2022-07-21 ENCOUNTER — Emergency Department (HOSPITAL_COMMUNITY)
Admission: EM | Admit: 2022-07-21 | Discharge: 2022-07-22 | Discharge status: Against Medical Advice | Payer: Medicaid Other | Attending: Student | Admitting: Student

## 2022-07-21 ENCOUNTER — Other Ambulatory Visit: Payer: Self-pay

## 2022-07-21 DIAGNOSIS — R1031 Right lower quadrant pain: Secondary | ICD-10-CM | POA: Diagnosis not present

## 2022-07-21 DIAGNOSIS — Z5321 Procedure and treatment not carried out due to patient leaving prior to being seen by health care provider: Secondary | ICD-10-CM | POA: Diagnosis not present

## 2022-07-21 DIAGNOSIS — R110 Nausea: Secondary | ICD-10-CM | POA: Insufficient documentation

## 2022-07-21 LAB — CBC
HCT: 40.6 % (ref 36.0–46.0)
Hemoglobin: 14.4 g/dL (ref 12.0–15.0)
MCH: 30.6 pg (ref 26.0–34.0)
MCHC: 35.5 g/dL (ref 30.0–36.0)
MCV: 86.4 fL (ref 80.0–100.0)
Platelets: 210 K/uL (ref 150–400)
RBC: 4.7 MIL/uL (ref 3.87–5.11)
RDW: 11.4 % — ABNORMAL LOW (ref 11.5–15.5)
WBC: 6.6 K/uL (ref 4.0–10.5)
nRBC: 0 % (ref 0.0–0.2)

## 2022-07-21 LAB — URINALYSIS, ROUTINE W REFLEX MICROSCOPIC
Bilirubin Urine: NEGATIVE
Glucose, UA: NEGATIVE mg/dL
Ketones, ur: NEGATIVE mg/dL
Leukocytes,Ua: NEGATIVE
Nitrite: NEGATIVE
Protein, ur: NEGATIVE mg/dL
RBC / HPF: >50 RBC/hpf — ABNORMAL HIGH (ref 0–5)
Specific Gravity, Urine: 1.021 (ref 1.005–1.030)
pH: 6 (ref 5.0–8.0)

## 2022-07-21 LAB — COMPREHENSIVE METABOLIC PANEL WITH GFR
ALT: 29 U/L (ref 0–44)
AST: 28 U/L (ref 15–41)
Albumin: 4.1 g/dL (ref 3.5–5.0)
Alkaline Phosphatase: 71 U/L (ref 38–126)
Anion gap: 7 (ref 5–15)
BUN: 7 mg/dL (ref 6–20)
CO2: 23 mmol/L (ref 22–32)
Calcium: 8.9 mg/dL (ref 8.9–10.3)
Chloride: 108 mmol/L (ref 98–111)
Creatinine, Ser: 0.54 mg/dL (ref 0.44–1.00)
GFR, Estimated: >60 mL/min
Glucose, Bld: 88 mg/dL (ref 70–99)
Potassium: 3.7 mmol/L (ref 3.5–5.1)
Sodium: 138 mmol/L (ref 135–145)
Total Bilirubin: 0.7 mg/dL (ref 0.3–1.2)
Total Protein: 6.9 g/dL (ref 6.5–8.1)

## 2022-07-21 LAB — I-STAT BETA HCG BLOOD, ED (MC, WL, AP ONLY): I-stat hCG, quantitative: <5 m[IU]/mL (ref <5)

## 2022-07-21 LAB — LIPASE, BLOOD: Lipase: 34 U/L (ref 11–51)

## 2022-07-21 MED ORDER — IOHEXOL 350 MG/ML SOLN
75.0000 mL | Freq: Once | INTRAVENOUS | Status: AC | PRN
  Administered 2022-07-21: 75 mL via INTRAVENOUS

## 2022-07-21 NOTE — ED Triage Notes (Signed)
RLQ abdominal pain since this morning.   Sent in from [facility] for appendix study. On menstrual cycle but says cramps are atypical.

## 2022-07-21 NOTE — ED Provider Triage Note (Signed)
Emergency Medicine Provider Triage Evaluation Note  Ashlee Simmons , a 26 y.o. female  was evaluated in triage.  Pt complains of right lower quadrant pain that started this morning.  She was seen at urgent care and they sent her here to rule out appendectomy.  No vomiting or diarrhea but reports some nausea.  Currently on menstrual cycle.  Also reporting some pressure in her bladder  Review of Systems  Positive: Abdominal pain and nausea Negative: Vomiting or diarrhea  Physical Exam  BP 118/83   Pulse 77   Temp 98.6 F (37 C) (Oral)   Resp 20   Ht 5\' 2"  (1.575 m)   Wt 77.1 kg   LMP 07/21/2022 (Exact Date)   SpO2 98%   BMI 31.09 kg/m  Gen:   Awake, no distress   Resp:  Normal effort  MSK:   Moves extremities without difficulty  Other:    Medical Decision Making  Medically screening exam initiated at 8:40 PM.  Appropriate orders placed.  Ashlee Simmons was informed that the remainder of the evaluation will be completed by another provider, this initial triage assessment does not replace that evaluation, and the importance of remaining in the ED until their evaluation is complete.     Ashlee Hammock, PA-C 07/21/22 2059

## 2022-07-22 NOTE — ED Notes (Signed)
Pt stated she has to leave because she has to take her daughter to school in the [place]

## 2023-01-19 ENCOUNTER — Telehealth: Payer: Medicaid Other | Admitting: Physician Assistant

## 2023-01-19 DIAGNOSIS — M545 Low back pain, unspecified: Secondary | ICD-10-CM

## 2023-01-19 NOTE — Patient Instructions (Addendum)
  Ashlee Simmons, thank you for joining [name], PA-C for today's virtual visit.  While this provider is not your primary care provider (PCP), if your PCP is located in our provider database this encounter information will be shared with them immediately following your visit.   A [facility] MyChart account gives you access to today's visit and all your visits, tests, and labs performed at [facility] " click here if you don't have a [facility] MyChart account or go to [email or web address]  Consent: (Patient) Ashlee Simmons provided verbal consent for this virtual visit at the beginning of the encounter.  Current Medications:  Current Outpatient Medications:    albuterol (VENTOLIN HFA) 108 (90 Base) MCG/ACT inhaler, Inhale 1-2 puffs into the lungs every 6 (six) hours as needed for wheezing or shortness of breath., Disp: 6.7 g, Rfl: 1   cyclobenzaprine (FLEXERIL) 10 MG tablet, Take 1 tablet (10 mg total) by mouth 3 (three) times daily. (Patient not taking: Reported on 07/21/2022), Disp: 21 tablet, Rfl: 0   hydrOXYzine (ATARAX) 25 MG tablet, Take 1 tablet by mouth daily as needed for anxiety., Disp: , Rfl:    ibuprofen (ADVIL) 800 MG tablet, Take 1 tablet (800 mg total) by mouth every 8 (eight) hours as needed. (Patient not taking: Reported on 07/21/2022), Disp: 30 tablet, Rfl: 1   naproxen (NAPROSYN) 500 MG tablet, Take 1 tablet (500 mg total) by mouth 2 (two) times daily. (Patient not taking: Reported on 07/21/2022), Disp: 30 tablet, Rfl: 0   sertraline (ZOLOFT) 50 MG tablet, Take 1 tablet (50 mg total) by mouth daily. (Patient not taking: Reported on 07/21/2022), Disp: 30 tablet, Rfl: 5   tiZANidine (ZANAFLEX) 4 MG tablet, Take 1 tablet (4 mg total) by mouth every 6 (six) hours as needed for muscle spasms. (Patient not taking: Reported on 07/21/2022), Disp: 30 tablet, Rfl: 0   triamcinolone cream (KENALOG) 0.1 %, Apply 1 application. topically 2 (two) times daily.  (Patient not taking: Reported on 07/21/2022), Disp: 30 g, Rfl: 0   Medications ordered in this encounter:  No orders of the defined types were placed in this encounter.    *If you need refills on other medications prior to your next appointment, please contact your pharmacy*  Follow-Up: Call back or seek an in-person evaluation if the symptoms worsen or if the condition fails to improve as anticipated.  [facility] Virtual Care [phone]  Other Instructions  [facility] [address] [place], [place] [address] URGENT CARE HOURS Monday - Friday: 8:00am to 8:00pm Saturday: 10:00am to 3:00pm [phone]    If you have been instructed to have an in-person evaluation today at a local Urgent Care facility, please use the link below. It will take you to a list of all of our available [facility], including address, phone number and hours of operation. Please do not delay care.  [facility]  If you or a family member do not have a primary care provider, use the link below to schedule a visit and establish care. When you choose a [facility] primary care physician or advanced practice provider, you gain a long-term partner in health. Find a Primary Care Provider  Learn more about [facility]'s in-office and virtual care options: [facility] - Get Care Now
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
## 2023-06-21 ENCOUNTER — Emergency Department (HOSPITAL_COMMUNITY)
Admission: EM | Admit: 2023-06-21 | Discharge: 2023-06-21 | Disposition: A | Discharge status: Home / Self Care | Payer: Medicaid Other | Attending: Emergency Medicine | Admitting: Emergency Medicine

## 2023-06-21 ENCOUNTER — Other Ambulatory Visit: Payer: Self-pay

## 2023-06-21 ENCOUNTER — Emergency Department (HOSPITAL_COMMUNITY): Payer: Medicaid Other

## 2023-06-21 DIAGNOSIS — S40021A Contusion of right upper arm, initial encounter: Secondary | ICD-10-CM | POA: Diagnosis not present

## 2023-06-21 DIAGNOSIS — S8011XA Contusion of right lower leg, initial encounter: Secondary | ICD-10-CM | POA: Diagnosis not present

## 2023-06-21 DIAGNOSIS — S40022A Contusion of left upper arm, initial encounter: Secondary | ICD-10-CM | POA: Diagnosis not present

## 2023-06-21 DIAGNOSIS — M791 Myalgia, unspecified site: Secondary | ICD-10-CM | POA: Diagnosis present

## 2023-06-21 DIAGNOSIS — R200 Anesthesia of skin: Secondary | ICD-10-CM | POA: Diagnosis not present

## 2023-06-21 DIAGNOSIS — W19XXXA Unspecified fall, initial encounter: Secondary | ICD-10-CM

## 2023-06-21 DIAGNOSIS — S8012XA Contusion of left lower leg, initial encounter: Secondary | ICD-10-CM | POA: Diagnosis not present

## 2023-06-21 DIAGNOSIS — W109XXA Fall (on) (from) unspecified stairs and steps, initial encounter: Secondary | ICD-10-CM | POA: Diagnosis not present

## 2023-06-21 MED ORDER — ACETAMINOPHEN 500 MG PO TABS
1000.0000 mg | ORAL_TABLET | Freq: Once | ORAL | Status: AC
  Administered 2023-06-21: 1000 mg via ORAL
  Filled 2023-06-21: qty 2

## 2023-06-21 MED ORDER — IBUPROFEN 400 MG PO TABS
400.0000 mg | ORAL_TABLET | Freq: Once | ORAL | Status: AC
  Administered 2023-06-21: 400 mg via ORAL
  Filled 2023-06-21: qty 1

## 2023-06-21 NOTE — Discharge Instructions (Signed)
You have been seen and discharged from the emergency department.  Your head CT was negative for any acute findings.  You most likely are suffering from a concussion.  Please see the information packet in regards to symptoms associated with this.  Stay well-hydrated.  Take Tylenol/ibuprofen as needed.  Follow-up with your primary provider for further evaluation and further care. Take home medications as prescribed. If you have any worsening symptoms or further concerns for your health please return to an emergency department for further evaluation.

## 2023-06-21 NOTE — ED Triage Notes (Signed)
Patient states she fell down a flight of stairs yesterday. Went to UC and was dx with a concussion. Endorses generalized body pain and blurry vision in R eye. Also numbness to L side of head x 1 year. Took Tylenol yesterday once without relief.

## 2023-06-21 NOTE — ED Provider Notes (Signed)
Patient signed out to me by previous provider. Please refer to their note for full HPI.  Briefly this is a 27 year old female who presented with concern for concussion-like symptoms.  Had a mechanical fall down stairs yesterday.  Was seen in urgent care and diagnosed with concussion but proceeds here with blurred vision and ongoing headache and nausea.  Patient signed out pending CT of the head.  CT to head shows no acute findings.  Had a long discussion with the patient regards to concussion symptoms and symptomatic treatment.  Plan for outpatient follow-up and patient agrees and is requesting discharge.  Patient at this time appears safe and stable for discharge and close outpatient follow up. Discharge plan and strict return to ED precautions discussed, patient verbalizes understanding and agreement.   Rozelle Logan, DO 06/21/23 1850

## 2023-06-21 NOTE — ED Provider Notes (Signed)
[facility] [facility] EMERGENCY DEPARTMENT AT [facility] Provider Note   CSN: [id number] Arrival date & time: 06/21/23  1203     History  Chief Complaint  Patient presents with   Marletta Lor    Ashlee Simmons is a 27 y.o. female.  This is a 27 year old female presents emergency department today after she fell down some stairs yesterday.  She went to an urgent care, and today she is still sore.  She has been taking Tylenol at home.  She is worried she has a concussion.   Fall       Home Medications Prior to Admission medications   Medication Sig Start Date End Date Taking? Authorizing Provider  albuterol (VENTOLIN HFA) 108 (90 Base) MCG/ACT inhaler Inhale 1-2 puffs into the lungs every 6 (six) hours as needed for wheezing or shortness of breath. 03/07/20   [place] Bing, MD  cyclobenzaprine (FLEXERIL) 10 MG tablet Take 1 tablet (10 mg total) by mouth 3 (three) times daily. Patient not taking: Reported on 07/21/2022 12/05/20   Mardella Layman, MD  hydrOXYzine (ATARAX) 25 MG tablet Take 1 tablet by mouth daily as needed for anxiety. 06/01/22   [provider]  ibuprofen (ADVIL) 800 MG tablet Take 1 tablet (800 mg total) by mouth every 8 (eight) hours as needed. Patient not taking: Reported on 07/21/2022 09/05/20   Hermina Staggers, MD  naproxen (NAPROSYN) 500 MG tablet Take 1 tablet (500 mg total) by mouth 2 (two) times daily. Patient not taking: Reported on 07/21/2022 05/21/21   Coralyn Mark, NP  sertraline (ZOLOFT) 50 MG tablet Take 1 tablet (50 mg total) by mouth daily. Patient not taking: Reported on 07/21/2022 08/14/20   Venora Maples, MD  tiZANidine (ZANAFLEX) 4 MG tablet Take 1 tablet (4 mg total) by mouth every 6 (six) hours as needed for muscle spasms. Patient not taking: Reported on 07/21/2022 12/04/20   Rhys Martini, PA-C  triamcinolone cream (KENALOG) 0.1 % Apply 1 application. topically 2 (two) times daily. Patient not taking: Reported on 07/21/2022  12/13/21   Gustavus Bryant, FNP      Allergies    Patient has no known allergies.    Review of Systems   Review of Systems  Physical Exam Updated Vital Signs BP 111/75   Pulse 87   Temp 98.3 F (36.8 C) (Oral)   Resp 18   SpO2 99%  Physical Exam Vitals reviewed.  Cardiovascular:     Rate and Rhythm: Normal rate.     Pulses: Normal pulses.  Pulmonary:     Effort: Pulmonary effort is normal.     Breath sounds: Normal breath sounds.  Skin:    General: Skin is warm and dry.     Coloration: Skin is not pale.  Neurological:     General: No focal deficit present.     Mental Status: She is alert. Mental status is at baseline.     Gait: Gait normal.     ED Results / Procedures / Treatments   Labs (all labs ordered are listed, but only abnormal results are displayed) Labs Reviewed - No data to display  EKG None  Radiology No results found.  Procedures Procedures    Medications Ordered in ED Medications  acetaminophen (TYLENOL) tablet 1,000 mg (1,000 mg Oral Given 06/21/23 1426)  ibuprofen (ADVIL) tablet 400 mg (400 mg Oral Given 06/21/23 1426)    ED Course/ Medical Decision Making/ A&P  Medical Decision Making 27 year old female here today after a slip and fall down some stairs yesterday.  Differential diagnoses include concussion, less likely ICH, contusions, low suspicion for fracture.  Plan # patient is some scattered abrasions and ecchymosis on her upper extremities and lower extremities.  No significant tenderness or deformity.  I discussed risk and benefits of CT imaging with the patient she preferred CT imaging.  Will obtain scan.  Analgesia provided.  Patient was signed out to Dr. Wilkie Aye pending CT imaging and disposition.  Amount and/or Complexity of Data Reviewed Radiology: ordered.  Risk OTC drugs. Prescription drug management.           Final Clinical Impression(s) / ED Diagnoses Final diagnoses:  Fall,  initial encounter    Rx / DC Orders ED Discharge Orders     None         Arletha Pili, DO 06/21/23 1521

## 2023-08-29 ENCOUNTER — Telehealth: Payer: Self-pay | Admitting: Nurse Practitioner

## 2023-08-29 DIAGNOSIS — B349 Viral infection, unspecified: Secondary | ICD-10-CM

## 2023-08-29 NOTE — Patient Instructions (Signed)
  Ashlee Simmons, thank you for joining Claiborne Rigg, NP for today's virtual visit.  While this provider is not your primary care provider (PCP), if your PCP is located in our provider database this encounter information will be shared with them immediately following your visit.   A [facility] MyChart account gives you access to today's visit and all your visits, tests, and labs performed at [facility] " click here if you don't have a [facility] MyChart account or go to [email or web address]  Consent: (Patient) Ashlee Simmons provided verbal consent for this virtual visit at the beginning of the encounter.  Current Medications:  Current Outpatient Medications:    albuterol (VENTOLIN HFA) 108 (90 Base) MCG/ACT inhaler, Inhale 1-2 puffs into the lungs every 6 (six) hours as needed for wheezing or shortness of breath., Disp: 6.7 g, Rfl: 1   cyclobenzaprine (FLEXERIL) 10 MG tablet, Take 1 tablet (10 mg total) by mouth 3 (three) times daily. (Patient not taking: Reported on 07/21/2022), Disp: 21 tablet, Rfl: 0   hydrOXYzine (ATARAX) 25 MG tablet, Take 1 tablet by mouth daily as needed for anxiety., Disp: , Rfl:    ibuprofen (ADVIL) 800 MG tablet, Take 1 tablet (800 mg total) by mouth every 8 (eight) hours as needed. (Patient not taking: Reported on 07/21/2022), Disp: 30 tablet, Rfl: 1   naproxen (NAPROSYN) 500 MG tablet, Take 1 tablet (500 mg total) by mouth 2 (two) times daily. (Patient not taking: Reported on 07/21/2022), Disp: 30 tablet, Rfl: 0   sertraline (ZOLOFT) 50 MG tablet, Take 1 tablet (50 mg total) by mouth daily. (Patient not taking: Reported on 07/21/2022), Disp: 30 tablet, Rfl: 5   tiZANidine (ZANAFLEX) 4 MG tablet, Take 1 tablet (4 mg total) by mouth every 6 (six) hours as needed for muscle spasms. (Patient not taking: Reported on 07/21/2022), Disp: 30 tablet, Rfl: 0   triamcinolone cream (KENALOG) 0.1 %, Apply 1 application. topically 2 (two) times daily. (Patient  not taking: Reported on 07/21/2022), Disp: 30 g, Rfl: 0   Medications ordered in this encounter:  No orders of the defined types were placed in this encounter.    *If you need refills on other medications prior to your next appointment, please contact your pharmacy*  Follow-Up: Call back or seek an in-person evaluation if the symptoms worsen or if the condition fails to improve as anticipated.  [facility] [phone]  Other Instructions Neti pot/sinus rinse Motrin/Tylenol for headache Dayquil/nyquil for sinus pain Stay hydrated and rest.    If you have been instructed to have an in-person evaluation today at a local Urgent Care facility, please use the link below. It will take you to a list of all of our available [facility], including address, phone number and hours of operation. Please do not delay care.  [facility]  If you or a family member do not have a primary care provider, use the link below to schedule a visit and establish care. When you choose a [facility] primary care physician or advanced practice provider, you gain a long-term partner in health. Find a Primary Care Provider  Learn more about [facility]'s in-office and virtual care options:  - Get Care Now

## 2023-08-29 NOTE — Progress Notes (Signed)
Virtual Visit Consent   Ashlee Simmons, you are scheduled for a virtual visit with a [facility] provider today. Just as with appointments in the office, your consent must be obtained to participate. Your consent will be active for this visit and any virtual visit you may have with one of our providers in the next 365 days. If you have a MyChart account, a copy of this consent can be sent to you electronically.  As this is a virtual visit, video technology does not allow for your provider to perform a traditional examination. This may limit your provider's ability to fully assess your condition. If your provider identifies any concerns that need to be evaluated in person or the need to arrange testing (such as labs, EKG, etc.), we will make arrangements to do so. Although advances in technology are sophisticated, we cannot ensure that it will always work on either your end or our end. If the connection with a video visit is poor, the visit may have to be switched to a telephone visit. With either a video or telephone visit, we are not always able to ensure that we have a secure connection.  By engaging in this virtual visit, you consent to the provision of healthcare and authorize for your insurance to be billed (if applicable) for the services provided during this visit. Depending on your insurance coverage, you may receive a charge related to this service.  I need to obtain your verbal consent now. Are you willing to proceed with your visit today? Ashlee Simmons has provided verbal consent on 08/29/2023 for a virtual visit (video or telephone). Claiborne Rigg, NP  Date: 08/29/2023 1:06 PM  Virtual Visit via Video Note   I, Claiborne Rigg, connected with  Ashlee Simmons  ([id number], 01-May-1996) on 08/29/23 at  1:00 PM EST by a video-enabled telemedicine application and verified that I am speaking with the correct person using two identifiers.  Location: Patient: Virtual Visit Location Patient:  Home Provider: Virtual Visit Location Provider: Home Office   I discussed the limitations of evaluation and management by telemedicine and the availability of in person appointments. The patient expressed understanding and agreed to proceed.    History of Present Illness: Ashlee Simmons is a 27 y.o. who identifies as a female who was assigned female at birth, and is being seen today for viral symptoms.  Ashlee Simmons states last night she started experiencing symptoms of sore throat, weakness, headache, diarrhea, nausea and decreased appetite. She did have an episode of vomiting that has now resolved. Taking mucinex OTC with no relief of symptoms. Has not taken OTC COVID/FLU test.    Problems:  Patient Active Problem List   Diagnosis Date Noted   History of bilateral salpingectomy 10/04/2020   Unwanted fertility 08/22/2020   Urinary retention with incomplete bladder emptying 02/02/2020   History of femur fracture 01/02/2020   History of marijuana use 01/02/2020   Hypothyroidism    Obesity (BMI 30-39.9) 12/15/2019   PTSD (post-traumatic stress disorder)    Anxiety    Depression    Asthma    Panic attacks 01/26/2016    Allergies: No Known Allergies Medications:  Current Outpatient Medications:    albuterol (VENTOLIN HFA) 108 (90 Base) MCG/ACT inhaler, Inhale 1-2 puffs into the lungs every 6 (six) hours as needed for wheezing or shortness of breath., Disp: 6.7 g, Rfl: 1   cyclobenzaprine (FLEXERIL) 10 MG tablet, Take 1 tablet (10 mg total) by mouth 3 (three) times daily. (Patient  not taking: Reported on 07/21/2022), Disp: 21 tablet, Rfl: 0   hydrOXYzine (ATARAX) 25 MG tablet, Take 1 tablet by mouth daily as needed for anxiety., Disp: , Rfl:    ibuprofen (ADVIL) 800 MG tablet, Take 1 tablet (800 mg total) by mouth every 8 (eight) hours as needed. (Patient not taking: Reported on 07/21/2022), Disp: 30 tablet, Rfl: 1   naproxen (NAPROSYN) 500 MG tablet, Take 1 tablet (500 mg total) by mouth 2  (two) times daily. (Patient not taking: Reported on 07/21/2022), Disp: 30 tablet, Rfl: 0   sertraline (ZOLOFT) 50 MG tablet, Take 1 tablet (50 mg total) by mouth daily. (Patient not taking: Reported on 07/21/2022), Disp: 30 tablet, Rfl: 5   tiZANidine (ZANAFLEX) 4 MG tablet, Take 1 tablet (4 mg total) by mouth every 6 (six) hours as needed for muscle spasms. (Patient not taking: Reported on 07/21/2022), Disp: 30 tablet, Rfl: 0   triamcinolone cream (KENALOG) 0.1 %, Apply 1 application. topically 2 (two) times daily. (Patient not taking: Reported on 07/21/2022), Disp: 30 g, Rfl: 0  Observations/Objective: Patient is well-developed, well-nourished in no acute distress.  Resting comfortably at home.  Head is normocephalic, atraumatic.  No labored breathing.  Speech is clear and coherent with logical content.  Patient is alert and oriented at baseline.    Assessment and Plan: 1. Viral illness Neti pot/sinus rinse Motrin/Tylenol for headache Dayquil/nyquil for sinus pain Stay hydrated and rest.   Follow Up Instructions: I discussed the assessment and treatment plan with the patient. The patient was provided an opportunity to ask questions and all were answered. The patient agreed with the plan and demonstrated an understanding of the instructions.  A copy of instructions were sent to the patient via MyChart unless otherwise noted below.   The patient was advised to call back or seek an in-person evaluation if the symptoms worsen or if the condition fails to improve as anticipated.    Claiborne Rigg, NP

## 2023-12-02 ENCOUNTER — Other Ambulatory Visit: Payer: Self-pay

## 2023-12-02 ENCOUNTER — Telehealth: Payer: Self-pay | Admitting: Physician Assistant

## 2023-12-02 DIAGNOSIS — M25511 Pain in right shoulder: Secondary | ICD-10-CM | POA: Diagnosis not present

## 2023-12-02 DIAGNOSIS — M792 Neuralgia and neuritis, unspecified: Secondary | ICD-10-CM

## 2023-12-02 MED ORDER — CYCLOBENZAPRINE HCL 10 MG PO TABS
5.0000 mg | ORAL_TABLET | Freq: Three times a day (TID) | ORAL | 0 refills | Status: DC | PRN
  Filled 2023-12-02: qty 30, 10d supply, fill #0

## 2023-12-02 MED ORDER — METHYLPREDNISOLONE 4 MG PO TBPK
ORAL_TABLET | ORAL | 0 refills | Status: AC
  Filled 2023-12-02: qty 21, 6d supply, fill #0

## 2023-12-02 NOTE — Patient Instructions (Signed)
 Ashlee Simmons, thank you for joining [name], PA-C for today's virtual visit.  While this provider is not your primary care provider (PCP), if your PCP is located in our provider database this encounter information will be shared with them immediately following your visit.   A [facility] MyChart account gives you access to today's visit and all your visits, tests, and labs performed at [facility] " click here if you don't have a [facility] MyChart account or go to [email or web address]  Consent: (Patient) Ashlee Simmons provided verbal consent for this virtual visit at the beginning of the encounter.  Current Medications:  Current Outpatient Medications:    cyclobenzaprine (FLEXERIL) 10 MG tablet, Take 0.5-1 tablets (5-10 mg total) by mouth 3 (three) times daily as needed., Disp: 30 tablet, Rfl: 0   methylPREDNISolone (MEDROL DOSEPAK) 4 MG TBPK tablet, 6 day taper; take as directed on package instructions, Disp: 21 tablet, Rfl: 0   albuterol (VENTOLIN HFA) 108 (90 Base) MCG/ACT inhaler, Inhale 1-2 puffs into the lungs every 6 (six) hours as needed for wheezing or shortness of breath., Disp: 6.7 g, Rfl: 1   hydrOXYzine (ATARAX) 25 MG tablet, Take 1 tablet by mouth daily as needed for anxiety., Disp: , Rfl:    ibuprofen (ADVIL) 800 MG tablet, Take 1 tablet (800 mg total) by mouth every 8 (eight) hours as needed. (Patient not taking: Reported on 07/21/2022), Disp: 30 tablet, Rfl: 1   naproxen (NAPROSYN) 500 MG tablet, Take 1 tablet (500 mg total) by mouth 2 (two) times daily. (Patient not taking: Reported on 07/21/2022), Disp: 30 tablet, Rfl: 0   sertraline (ZOLOFT) 50 MG tablet, Take 1 tablet (50 mg total) by mouth daily. (Patient not taking: Reported on 07/21/2022), Disp: 30 tablet, Rfl: 5   triamcinolone cream (KENALOG) 0.1 %, Apply 1 application. topically 2 (two) times daily. (Patient not taking: Reported on 07/21/2022), Disp: 30 g, Rfl: 0   Medications ordered  in this encounter:  Meds ordered this encounter  Medications   methylPREDNISolone (MEDROL DOSEPAK) 4 MG TBPK tablet    Sig: 6 day taper; take as directed on package instructions    Dispense:  21 tablet    Refill:  0    Supervising Provider:   Merrilee Jansky [[id number]]   cyclobenzaprine (FLEXERIL) 10 MG tablet    Sig: Take 0.5-1 tablets (5-10 mg total) by mouth 3 (three) times daily as needed.    Dispense:  30 tablet    Refill:  0    Supervising Provider:   Merrilee Jansky [[id number]]     *If you need refills on other medications prior to your next appointment, please contact your pharmacy*  Follow-Up: Call back or seek an in-person evaluation if the symptoms worsen or if the condition fails to improve as anticipated.  [facility] [phone]  Other Instructions  [facility] ADDRESS [address] [place], [place] [address] CONTACT Phone: [phone] Fax: [phone] URGENT CARE HOURS Monday-Sunday: 9:00AM - 9:00PM  HOLIDAY HOURS Holidays: 8:30AM - 4:30PM   Radicular Pain Radicular pain is a type of pain that spreads from your back or neck along a spinal nerve. Spinal nerves are nerves that leave the spinal cord and go to the muscles. Radicular pain is sometimes called radiculopathy, radiculitis, or a pinched nerve. When you have this type of pain, you may also have weakness, numbness, or tingling in the area of your body that is supplied by the nerve. The pain may  feel sharp and burning. Depending on which spinal nerve is affected, the pain may occur in the: Neck area (cervical radicular pain). You may also feel pain, numbness, weakness, or tingling in the arms. Mid-spine area (thoracic radicular pain). You would feel this pain in the back and chest. This type is rare. Lower back area (lumbar radicular pain). You would feel this pain as low back pain. You may feel pain, numbness, weakness, or tingling in the buttocks or legs. Sciatica  is a type of lumbar radicular pain that shoots down the back of the leg. Radicular pain occurs when one of the spinal nerves becomes irritated or squeezed (compressed). It is often caused by something pushing on a spinal nerve, such as one of the bones of the spine (vertebrae) or one of the round cushions between vertebrae (intervertebral disks). This can result from: An injury. Wear and tear or aging of a disk. The growth of a bone spur that pushes on the nerve. Radicular pain often goes away when you follow instructions from your health care provider for relieving pain at home. How is this treated? Treatment may depend on the cause of the condition and may include: Working with a physical therapist. Taking pain medicine. Applying heat or ice or both to the affected areas. Doing stretches to improve flexibility. Having surgery. This may be needed if other treatments do not help. Different types of surgery may be done depending on the cause of this condition. Follow these instructions at home: Managing pain     If directed, put ice on the affected area. To do this: Put ice in a plastic bag. Place a towel between your skin and the bag. Leave the ice on for 20 minutes, 2-3 times a day. Remove the ice if your skin turns bright red. This is very important. If you cannot feel pain, heat, or cold, you have a greater risk of damage to the area. If directed, apply heat to the affected area as often as told by your health care provider. Use the heat source that your health care provider recommends, such as a moist heat pack or a heating pad. Place a towel between your skin and the heat source. Leave the heat on for 20-30 minutes. Remove the heat if your skin turns bright red. This is especially important if you are unable to feel pain, heat, or cold. You have a greater risk of getting burned. Activity Do not sit or rest in bed for long periods of time. Try to stay as active as possible. Ask your  health care provider what type of exercise or activity is best for you. Avoid activities that make your pain worse, such as bending and lifting. You may have to avoid lifting. Ask your health care provider how much you can safely lift. Practice using proper technique when lifting items. Proper lifting technique involves bending your knees and rising up. Do strength and range-of-motion exercises only as told by your health care provider or physical therapist. General instructions Take over-the-counter and prescription medicines only as told by your health care provider. Pay attention to any changes in your symptoms. Keep all follow-up visits. This is important. Contact a health care provider if: Your pain and other symptoms get worse. Your pain medicine is not helping. Your pain has not improved after a few weeks of home care. You have a fever. Get help right away if: You have severe pain, weakness, or numbness. You have difficulty with bladder or bowel control. Summary  Radicular pain is a type of pain that spreads from your back or neck along a spinal nerve. When you have radicular pain, you may also have weakness, numbness, or tingling in the area of your body that is supplied by the nerve. The pain may feel sharp or burning. Radicular pain may be treated with ice, heat, medicines, or physical therapy. This information is not intended to replace advice given to you by your health care provider. Make sure you discuss any questions you have with your health care provider. Document Revised: 03/14/2021 Document Reviewed: 03/14/2021 Elsevier Patient Education  2024 Elsevier Inc.   If you have been instructed to have an in-person evaluation today at a local Urgent Care facility, please use the link below. It will take you to a list of all of our available [facility], including address, phone number and hours of operation. Please do not delay care.  [facility]  If  you or a family member do not have a primary care provider, use the link below to schedule a visit and establish care. When you choose a [facility] primary care physician or advanced practice provider, you gain a long-term partner in health. Find a Primary Care Provider  Learn more about [facility]'s in-office and virtual care options: [facility] - Get Care Now

## 2023-12-02 NOTE — Progress Notes (Signed)
 Virtual Visit Consent   Ashlee Simmons, you are scheduled for a virtual visit with a [facility] provider today. Just as with appointments in the office, your consent must be obtained to participate. Your consent will be active for this visit and any virtual visit you may have with one of our providers in the next 365 days. If you have a MyChart account, a copy of this consent can be sent to you electronically.  As this is a virtual visit, video technology does not allow for your provider to perform a traditional examination. This may limit your provider's ability to fully assess your condition. If your provider identifies any concerns that need to be evaluated in person or the need to arrange testing (such as labs, EKG, etc.), we will make arrangements to do so. Although advances in technology are sophisticated, we cannot ensure that it will always work on either your end or our end. If the connection with a video visit is poor, the visit may have to be switched to a telephone visit. With either a video or telephone visit, we are not always able to ensure that we have a secure connection.  By engaging in this virtual visit, you consent to the provision of healthcare and authorize for your insurance to be billed (if applicable) for the services provided during this visit. Depending on your insurance coverage, you may receive a charge related to this service.  I need to obtain your verbal consent now. Are you willing to proceed with your visit today? Ashlee Simmons has provided verbal consent on 12/02/2023 for a virtual visit (video or telephone). [name], PA-C  Date: 12/02/2023 2:24 PM   Virtual Visit via Video Note   I, [name], connected with  Ashlee Simmons  ([id number], 12-25-1995) on 12/02/23 at  2:15 PM EDT by a video-enabled telemedicine application and verified that I am speaking with the correct person using two identifiers.  Location: Patient: Virtual Visit Location  Patient: Home Provider: Virtual Visit Location Provider: Home Office   I discussed the limitations of evaluation and management by telemedicine and the availability of in person appointments. The patient expressed understanding and agreed to proceed.    History of Present Illness: Ashlee Simmons is a 28 y.o. who identifies as a female who was assigned female at birth, and is being seen today for arm pain with numbness and tingling.  HPI: Arm Pain  The incident occurred more than 1 week ago (couple of weeks). There was no injury mechanism. The pain is present in the right shoulder, right elbow, right forearm, right fingers and right hand. Quality: pins and needle sensation. The pain radiates to the right arm, right hand and right neck. The pain is moderate. Associated symptoms include numbness and tingling. Pertinent negatives include no muscle weakness. Associated symptoms comments: Reports weakened grip strength. The symptoms are aggravated by movement and lifting (sleeping, putting pressure on it). She has tried NSAIDs (ibuprofen) for the symptoms. The treatment provided no relief.    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 She is having numbness and tingling from the neck to the fingertips. Reports decreased grip strength in right hand. Has had this previously but normally resolves on its own with rest and Ibuprofen. This time it is not. She has had remote injuries to that arm, but none recent.   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   Urinary retention with incomplete bladder emptying 02/02/2020   History of femur fracture 01/02/2020   History of marijuana use 01/02/2020   Hypothyroidism    Obesity (BMI 30-39.9) 12/15/2019   PTSD (post-traumatic stress disorder)    Anxiety    Depression    Asthma    Panic attacks 01/26/2016    Allergies: No Known  Allergies Medications:  Current Outpatient Medications:    cyclobenzaprine (FLEXERIL) 10 MG tablet, Take 0.5-1 tablets (5-10 mg total) by mouth 3 (three) times daily as needed., Disp: 30 tablet, Rfl: 0   methylPREDNISolone (MEDROL DOSEPAK) 4 MG TBPK tablet, 6 day taper; take as directed on package instructions, Disp: 21 tablet, Rfl: 0   albuterol (VENTOLIN HFA) 108 (90 Base) MCG/ACT inhaler, Inhale 1-2 puffs into the lungs every 6 (six) hours as needed for wheezing or shortness of breath., Disp: 6.7 g, Rfl: 1   hydrOXYzine (ATARAX) 25 MG tablet, Take 1 tablet by mouth daily as needed for anxiety., Disp: , Rfl:    ibuprofen (ADVIL) 800 MG tablet, Take 1 tablet (800 mg total) by mouth every 8 (eight) hours as needed. (Patient not taking: Reported on 07/21/2022), Disp: 30 tablet, Rfl: 1   naproxen (NAPROSYN) 500 MG tablet, Take 1 tablet (500 mg total) by mouth 2 (two) times daily. (Patient not taking: Reported on 07/21/2022), Disp: 30 tablet, Rfl: 0   sertraline (ZOLOFT) 50 MG tablet, Take 1 tablet (50 mg total) by mouth daily. (Patient not taking: Reported on 07/21/2022), Disp: 30 tablet, Rfl: 5   triamcinolone cream (KENALOG) 0.1 %, Apply 1 application. topically 2 (two) times daily. (Patient not taking: Reported on 07/21/2022), Disp: 30 g, Rfl: 0  Observations/Objective: Patient is well-developed, well-nourished in no acute distress.  Resting comfortably at home.  Head is normocephalic, atraumatic.  No labored breathing.  Speech is clear and coherent with logical content.  Patient is alert and oriented at baseline.    Assessment and Plan: 1. Acute pain of right shoulder (Primary) - methylPREDNISolone (MEDROL DOSEPAK) 4 MG TBPK tablet; 6 day taper; take as directed on package instructions  Dispense: 21 tablet; Refill: 0 - cyclobenzaprine (FLEXERIL) 10 MG tablet; Take 0.5-1 tablets (5-10 mg total) by mouth 3 (three) times daily as needed.  Dispense: 30 tablet; Refill: 0  2. Radicular pain in  right arm  - Suspect possible muscle spasm and inflammation causing radicular pain in the right arm from the shoulder to the fingertips. Unfortunately, virtual visit is limited in determining if from neck, shoulder, elbow, or hand. - Failed NSAIDs - Add Medrol dose pack and flexeril to see if more MSK vs a structural issue (DDD, elbow impingement, less likely carpal tunnel) - Avoid NSAIDs (Ibuprofen/Advil/Motrin or Naproxen/Aleve) while on steroid - Tylenol if okay for breakthrough pain - Heat to area - Massage to neck and upper back if able - Seek in person evaluation if worsening or fails to improve with treatment; Discussed [facility]   Follow Up Instructions: I discussed the assessment and treatment plan with the patient. The patient was provided an opportunity to ask questions and all were answered. The patient agreed with the plan and demonstrated an understanding of the instructions.  A copy of instructions were sent to the patient via MyChart unless otherwise noted below.   The patient was advised to call back or seek an in-person evaluation if the symptoms worsen or if the condition fails to improve as anticipated.    [name],  PA-C

## 2023-12-23 ENCOUNTER — Telehealth: Payer: Self-pay | Admitting: Urgent Care

## 2023-12-23 NOTE — Progress Notes (Signed)
 Pt answered phone call. Pt signed up for video visit on behalf of her daughter, but was unable to get daughters mychart signed up. Pt also stated she had an emergency and needed to cancel. No charge as visit not conducted.

## 2023-12-23 NOTE — Patient Instructions (Signed)
 Visit not conducted per pt request. Will reschedule for daughter tomorrow.

## 2023-12-30 ENCOUNTER — Telehealth: Payer: Self-pay | Admitting: Urgent Care

## 2023-12-30 DIAGNOSIS — L739 Follicular disorder, unspecified: Secondary | ICD-10-CM

## 2023-12-30 MED ORDER — CEPHALEXIN 500 MG PO CAPS: 500.0000 mg | ORAL_CAPSULE | Freq: Four times a day (QID) | ORAL | 0 refills | Status: AC

## 2023-12-30 MED ORDER — MUPIROCIN 2 % EX OINT: 1.0000 | TOPICAL_OINTMENT | Freq: Three times a day (TID) | CUTANEOUS | 0 refills | Status: AC

## 2023-12-30 NOTE — Patient Instructions (Addendum)
 [name], thank you for joining Maretta Bees, PA for today's virtual visit.  While this provider is not your primary care provider (PCP), if your PCP is located in our provider database this encounter information will be shared with them immediately following your visit.   A [facility] MyChart account gives you access to today's visit and all your visits, tests, and labs performed at [facility] " click here if you don't have a [facility] MyChart account or go to [email or web address]  Consent: (Patient) Ashlee Simmons provided verbal consent for this virtual visit at the beginning of the encounter.  Current Medications:  Current Outpatient Medications:    cephALEXin (KEFLEX) 500 MG capsule, Take 1 capsule (500 mg total) by mouth 4 (four) times daily for 5 days., Disp: 20 capsule, Rfl: 0   mupirocin ointment (BACTROBAN) 2 %, Apply 1 Application topically 3 (three) times daily., Disp: 22 g, Rfl: 0   albuterol (VENTOLIN HFA) 108 (90 Base) MCG/ACT inhaler, Inhale 1-2 puffs into the lungs every 6 (six) hours as needed for wheezing or shortness of breath., Disp: 6.7 g, Rfl: 1   cyclobenzaprine (FLEXERIL) 10 MG tablet, Take 0.5-1 tablets (5-10 mg total) by mouth 3 (three) times daily as needed., Disp: 30 tablet, Rfl: 0   hydrOXYzine (ATARAX) 25 MG tablet, Take 1 tablet by mouth daily as needed for anxiety., Disp: , Rfl:    ibuprofen (ADVIL) 800 MG tablet, Take 1 tablet (800 mg total) by mouth every 8 (eight) hours as needed. (Patient not taking: Reported on 07/21/2022), Disp: 30 tablet, Rfl: 1   naproxen (NAPROSYN) 500 MG tablet, Take 1 tablet (500 mg total) by mouth 2 (two) times daily. (Patient not taking: Reported on 07/21/2022), Disp: 30 tablet, Rfl: 0   sertraline (ZOLOFT) 50 MG tablet, Take 1 tablet (50 mg total) by mouth daily. (Patient not taking: Reported on 07/21/2022), Disp: 30 tablet, Rfl: 5   triamcinolone cream (KENALOG) 0.1 %, Apply 1 application. topically 2  (two) times daily. (Patient not taking: Reported on 07/21/2022), Disp: 30 g, Rfl: 0   Medications ordered in this encounter:  Meds ordered this encounter  Medications   cephALEXin (KEFLEX) 500 MG capsule    Sig: Take 1 capsule (500 mg total) by mouth 4 (four) times daily for 5 days.    Dispense:  20 capsule    Refill:  0   mupirocin ointment (BACTROBAN) 2 %    Sig: Apply 1 Application topically 3 (three) times daily.    Dispense:  22 g    Refill:  0     *If you need refills on other medications prior to your next appointment, please contact your pharmacy*  Follow-Up: Call back or seek an in-person evaluation if the symptoms worsen or if the condition fails to improve as anticipated.  [facility] [phone]  Other Instructions Take keflex four times daily until gone, for a total of 5 days. Use the topical antibiotic ointment to the affected areas of your skin three times daily until resolved. Do NOT pop the pustules. Try to avoid itching the area, and wash your hands after touching. Follow up at an in person office visit if symptoms do not improve.   If you have been instructed to have an in-person evaluation today at a local Urgent Care facility, please use the link below. It will take you to a list of all of our available [facility], including address, phone number and hours of operation.  Please do not delay care.  [facility]  If you or a family member do not have a primary care provider, use the link below to schedule a visit and establish care. When you choose a [facility] primary care physician or advanced practice provider, you gain a long-term partner in health. Find a Primary Care Provider  Learn more about [facility]'s in-office and virtual care options: [facility] - Get Care Now

## 2023-12-30 NOTE — Progress Notes (Signed)
 Virtual Visit Consent   Ashlee Simmons, you are scheduled for a virtual visit with a [facility] provider today. Just as with appointments in the office, your consent must be obtained to participate. Your consent will be active for this visit and any virtual visit you may have with one of our providers in the next 365 days. If you have a MyChart account, a copy of this consent can be sent to you electronically.  As this is a virtual visit, video technology does not allow for your provider to perform a traditional examination. This may limit your provider's ability to fully assess your condition. If your provider identifies any concerns that need to be evaluated in person or the need to arrange testing (such as labs, EKG, etc.), we will make arrangements to do so. Although advances in technology are sophisticated, we cannot ensure that it will always work on either your end or our end. If the connection with a video visit is poor, the visit may have to be switched to a telephone visit. With either a video or telephone visit, we are not always able to ensure that we have a secure connection.  By engaging in this virtual visit, you consent to the provision of healthcare and authorize for your insurance to be billed (if applicable) for the services provided during this visit. Depending on your insurance coverage, you may receive a charge related to this service.  I need to obtain your verbal consent now. Are you willing to proceed with your visit today? Ashlee Simmons has provided verbal consent on 12/30/2023 for a virtual visit (video or telephone). Maretta Bees, [place]  Date: 12/30/2023 5:58 PM   Virtual Visit via Video Note   I, Ashlee Simmons, connected with  Ashlee Simmons  ([id number], [birth year]) on 12/30/23 at  6:00 PM EDT by a video-enabled telemedicine application and verified that I am speaking with the correct person using two identifiers.  Location: Patient: Virtual Visit Location Patient:  Home Provider: Virtual Visit Location Provider: Home Office   I discussed the limitations of evaluation and management by telemedicine and the availability of in person appointments. The patient expressed understanding and agreed to proceed.    History of Present Illness: Ashlee Simmons is a 28 y.o. who identifies as a female who was assigned female at birth, and is being seen today for rash.  HPI: HPI   28yo female presents today with concerns of a red, irritated rash to her bilateral lower extremities. States it is only on the lower legs, primarily anterior shins bilaterally. Started last night after getting out of a bathtub at home, was not a hot tub. States the area itches and has "yellow pus" coming out of some of them. No treatments tried. No one else at the house has similar rash. No fever or systemic symptoms. 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 Denies any plant or external triggers.   Problems:  Patient Active Problem List   Diagnosis Date Noted   History of bilateral salpingectomy 10/04/2020   Unwanted fertility 08/22/2020   Urinary retention with incomplete bladder emptying 02/02/2020   History of femur fracture 01/02/2020   History of marijuana use 01/02/2020   Hypothyroidism    Obesity (BMI 30-39.9) 12/15/2019   PTSD (post-traumatic stress disorder)    Anxiety    Depression    Asthma    Panic attacks 01/26/2016    Allergies: No Known Allergies Medications:  Current Outpatient Medications:  cephALEXin (KEFLEX) 500 MG capsule, Take 1 capsule (500 mg total) by mouth 4 (four) times daily for 5 days., Disp: 20 capsule, Rfl: 0   mupirocin ointment (BACTROBAN) 2 %, Apply 1 Application topically 3 (three) times daily., Disp: 22 g, Rfl: 0   albuterol (VENTOLIN HFA) 108 (90 Base) MCG/ACT inhaler, Inhale 1-2 puffs into the lungs every 6 (six) hours as needed for wheezing or shortness of breath., Disp: 6.7 g, Rfl: 1   cyclobenzaprine  (FLEXERIL) 10 MG tablet, Take 0.5-1 tablets (5-10 mg total) by mouth 3 (three) times daily as needed., Disp: 30 tablet, Rfl: 0   hydrOXYzine (ATARAX) 25 MG tablet, Take 1 tablet by mouth daily as needed for anxiety., Disp: , Rfl:    ibuprofen (ADVIL) 800 MG tablet, Take 1 tablet (800 mg total) by mouth every 8 (eight) hours as needed. (Patient not taking: Reported on 07/21/2022), Disp: 30 tablet, Rfl: 1   naproxen (NAPROSYN) 500 MG tablet, Take 1 tablet (500 mg total) by mouth 2 (two) times daily. (Patient not taking: Reported on 07/21/2022), Disp: 30 tablet, Rfl: 0   sertraline (ZOLOFT) 50 MG tablet, Take 1 tablet (50 mg total) by mouth daily. (Patient not taking: Reported on 07/21/2022), Disp: 30 tablet, Rfl: 5   triamcinolone cream (KENALOG) 0.1 %, Apply 1 application. topically 2 (two) times daily. (Patient not taking: Reported on 07/21/2022), Disp: 30 g, Rfl: 0  Observations/Objective: Patient is well-developed, well-nourished in no acute distress.  Resting comfortably at home.  Head is normocephalic, atraumatic.  No labored breathing.  Speech is clear and coherent with logical content.  Patient is alert and oriented at baseline.  Scattered patches of pustules on erythematous base to mid to distal shins bilaterally. No vesicles or crusting. No drainage. No edema. No lymphangitis.  Assessment and Plan: 1. Folliculitis (Primary) - cephALEXin (KEFLEX) 500 MG capsule; Take 1 capsule (500 mg total) by mouth 4 (four) times daily for 5 days.  Dispense: 20 capsule; Refill: 0 - mupirocin ointment (BACTROBAN) 2 %; Apply 1 Application topically 3 (three) times daily.  Dispense: 22 g; Refill: 0  Symptoms consistent with folliculitis, likely due to recent bath. Will start PO cephalexin and topical abx ointment.  Follow Up Instructions: I discussed the assessment and treatment plan with the patient. The patient was provided an opportunity to ask questions and all were answered. The patient agreed with  the plan and demonstrated an understanding of the instructions.  A copy of instructions were sent to the patient via MyChart unless otherwise noted below.    The patient was advised to call back or seek an in-person evaluation if the symptoms worsen or if the condition fails to improve as anticipated.    Maretta Bees, PA

## 2024-07-11 ENCOUNTER — Ambulatory Visit
Admission: EM | Admit: 2024-07-11 | Discharge: 2024-07-11 | Disposition: A | Discharge status: Home / Self Care | Attending: Emergency Medicine | Admitting: Emergency Medicine

## 2024-07-11 ENCOUNTER — Encounter: Payer: Self-pay | Admitting: Emergency Medicine

## 2024-07-11 DIAGNOSIS — R102 Pelvic and perineal pain unspecified side: Secondary | ICD-10-CM | POA: Diagnosis not present

## 2024-07-11 DIAGNOSIS — M545 Low back pain, unspecified: Secondary | ICD-10-CM

## 2024-07-11 LAB — POCT URINE DIPSTICK
Bilirubin, UA: NEGATIVE
Blood, UA: NEGATIVE
Glucose, UA: NEGATIVE mg/dL
Ketones, POC UA: NEGATIVE mg/dL
Leukocytes, UA: NEGATIVE
Nitrite, UA: NEGATIVE
POC PROTEIN,UA: NEGATIVE
Spec Grav, UA: >=1.03 — AB (ref 1.010–1.025)
Urobilinogen, UA: 0.2 U/dL
pH, UA: 6.5 (ref 5.0–8.0)

## 2024-07-11 MED ORDER — CYCLOBENZAPRINE HCL 5 MG PO TABS: 5.0000 mg | ORAL_TABLET | Freq: Three times a day (TID) | ORAL | 0 refills | Status: AC | PRN

## 2024-07-11 MED ORDER — KETOROLAC TROMETHAMINE 30 MG/ML IJ SOLN
30.0000 mg | Freq: Once | INTRAMUSCULAR | Status: AC
  Administered 2024-07-11: 30 mg via INTRAMUSCULAR

## 2024-07-11 NOTE — ED Provider Notes (Signed)
 [name]    CSN: [id number] Arrival date & time: 07/11/24  1119      History   Chief Complaint Chief Complaint  Patient presents with   Back Pain    HPI Ashlee Simmons is a 28 y.o. female.    Patient presents for evaluation of flared pelvic and low back pain beginning 2 to 3 days ago.  Both pelvic and low back pain stretch across the lower aspect, described as a severe cramping.  Back pain radiates from the upper back to the lower, can be felt with all movement. Has had urinary frequency over the last 1 to 2 months. Symptoms can be flared by sexual intercourse which she had 4 to 5 days ago, endorses monogamy with partner, denies vaginal discharge itching or odor.  No concern for pregnancy, history of tubal ligation, monthly menstruation 06/25/2024, endorses similar symptoms during this time.  Denies dysuria, fever, hematuria.     Past Medical History:  Diagnosis Date   Anxiety    Asthma    Depression    PTSD (post-traumatic stress disorder)     Patient Active Problem List   Diagnosis Date Noted   History of bilateral salpingectomy 10/04/2020   Unwanted fertility 08/22/2020   Urinary retention with incomplete bladder emptying 02/02/2020   History of femur fracture 01/02/2020   History of marijuana use 01/02/2020   Hypothyroidism    Obesity (BMI 30-39.9) 12/15/2019   PTSD (post-traumatic stress disorder)    Anxiety    Depression    Asthma    Panic attacks 01/26/2016    Past Surgical History:  Procedure Laterality Date   FEMUR SURGERY     plate in pelvis, rods in each femur   HIP SURGERY     LAPAROSCOPIC BILATERAL SALPINGECTOMY Bilateral 09/05/2020   Procedure: LAPAROSCOPIC BILATERAL SALPINGECTOMY;  Surgeon: Lorence Ozell CROME, MD;  Location: [facility];  Service: Gynecology;  Laterality: Bilateral;   site fallopian tube bilaterally   MOUTH SURGERY      OB History     Gravida  3   Para  3   Term  3   Preterm      AB       Living  3      SAB      IAB      Ectopic      Multiple  0   Live Births  3            Home Medications    Prior to Admission medications   Medication Sig Start Date End Date Taking? Authorizing Provider  cyclobenzaprine  (FLEXERIL ) 5 MG tablet Take 1 tablet (5 mg total) by mouth 3 (three) times daily as needed for muscle spasms. 07/11/24  Yes Blas Riches, Shelba SAUNDERS, NP  albuterol  (VENTOLIN  HFA) 108 (90 Base) MCG/ACT inhaler Inhale 1-2 puffs into the lungs every 6 (six) hours as needed for wheezing or shortness of breath. 03/07/20   Izell Harari, MD  hydrOXYzine  (ATARAX ) 25 MG tablet Take 1 tablet by mouth daily as needed for anxiety. 06/01/22   [provider]  ibuprofen  (ADVIL ) 800 MG tablet Take 1 tablet (800 mg total) by mouth every 8 (eight) hours as needed. Patient not taking: Reported on 07/21/2022 09/05/20   Ervin, Michael L, MD  mupirocin  ointment (BACTROBAN ) 2 % Apply 1 Application topically 3 (three) times daily. 12/30/23   Crain, Whitney L, PA  naproxen  (NAPROSYN ) 500 MG tablet Take 1 tablet (500 mg total) by mouth 2 (  two) times daily. Patient not taking: Reported on 07/21/2022 05/21/21   Merilee Andrea CROME, NP  sertraline  (ZOLOFT ) 50 MG tablet Take 1 tablet (50 mg total) by mouth daily. Patient not taking: Reported on 07/21/2022 08/14/20   Lola Donnice HERO, MD  triamcinolone  cream (KENALOG ) 0.1 % Apply 1 application. topically 2 (two) times daily. Patient not taking: Reported on 07/21/2022 12/13/21   Hazen Darryle BRAVO, FNP    Family History Family History  Problem Relation Age of Onset   Thyroid disease Mother    Thyroid disease Maternal Grandmother    COPD Maternal Grandfather    Alcohol abuse Neg Hx    Arthritis Neg Hx    Asthma Neg Hx    Birth defects Neg Hx    Cancer Neg Hx    Depression Neg Hx    Diabetes Neg Hx    Drug abuse Neg Hx    Early death Neg Hx    Hearing loss Neg Hx    Heart disease Neg Hx    Hyperlipidemia Neg Hx    Hypertension  Neg Hx    Kidney disease Neg Hx    Learning disabilities Neg Hx    Mental illness Neg Hx    Mental retardation Neg Hx    Miscarriages / Stillbirths Neg Hx    Stroke Neg Hx    Vision loss Neg Hx    Varicose Veins Neg Hx    Intellectual disability Neg Hx    Obesity Neg Hx    ADD / ADHD Neg Hx    Anxiety disorder Neg Hx     Social History Social History   Tobacco Use   Smoking status: Never   Smokeless tobacco: Former  Building services engineer status: Never Used  Substance Use Topics   Alcohol use: Not Currently    Comment: occ   Drug use: Not Currently    Types: Marijuana    Comment: none with pregnancy     Allergies   Patient has no known allergies.   Review of Systems Review of Systems   Physical Exam Triage Vital Signs ED Triage Vitals  Encounter Vitals Group     BP 07/11/24 1133 111/76     Girls Systolic BP Percentile --      Girls Diastolic BP Percentile --      Boys Systolic BP Percentile --      Boys Diastolic BP Percentile --      Pulse Rate 07/11/24 1133 82     Resp 07/11/24 1133 18     Temp 07/11/24 1133 98.8 F (37.1 C)     Temp Source 07/11/24 1133 Oral     SpO2 07/11/24 1133 99 %     Weight --      Height --      Head Circumference --      Peak Flow --      Pain Score 07/11/24 1130 7     Pain Loc --      Pain Education --      Exclude from Growth Chart --    No data found.  Updated Vital Signs BP 111/76 (BP Location: Left Arm)   Pulse 82   Temp 98.8 F (37.1 C) (Oral)   Resp 18   LMP 06/25/2024 (Exact Date)   SpO2 99%   Visual Acuity Right Eye Distance:   Left Eye Distance:   Bilateral Distance:    Right Eye Near:   Left Eye Near:    Bilateral  Near:     Physical Exam Constitutional:      Appearance: Normal appearance.  Eyes:     Extraocular Movements: Extraocular movements intact.  Pulmonary:     Effort: Pulmonary effort is normal.  Abdominal:     General: Abdomen is flat. Bowel sounds are normal.     Palpations:  Abdomen is soft.     Tenderness: There is abdominal tenderness in the suprapubic area.  Musculoskeletal:     Comments: Tenderness generalized to the lower lumbar region of the back, no ecchymosis swelling or deformity, no spinal tenderness noted  Neurological:     Mental Status: She is alert and oriented to person, place, and time. Mental status is at baseline.      UC Treatments / Results  Labs (all labs ordered are listed, but only abnormal results are displayed) Labs Reviewed  POCT URINE DIPSTICK - Abnormal; Notable for the following components:      Result Value   Clarity, UA cloudy (*)    Spec Grav, UA >=1.030 (*)    All other components within normal limits    EKG   Radiology No results found.  Procedures Procedures (including critical care time)  Medications Ordered in UC Medications  ketorolac  (TORADOL ) 30 MG/ML injection 30 mg (has no administration in time range)    Initial Impression / Assessment and Plan / UC Course  I have reviewed the triage vital signs and the nursing notes.  Pertinent labs & imaging results that were available during my care of the patient were reviewed by me and considered in my medical decision making (see chart for details).  Pelvic pain, acute bilateral low back pain without sciatica  Urinalysis negative, discussed findings with patient, unknown etiology at this time, pelvic tenderness on exam, nonguarding, stable for outpatient management, Toradol  IM given and prescribed Flexeril  for home use recommended supportive care and given walker referral to gynecology for further evaluation and management Final Clinical Impressions(s) / UC Diagnoses   Final diagnoses:  Acute bilateral low back pain without sciatica  Pelvic pain     Discharge Instructions      Today you are evaluated for your pelvic and low back pain  Urinalysis is negative for bladder infection  Will most likely need additional workup to determine exact cause  therefore please schedule follow-up appointment with gynecologist, information on front page  You have been given an injection of Toradol  in an attempt to help reduce discomfort, ideally will see improvement within 30 minutes  At home you may use muscle relaxant every 8 hours as needed, may take in addition to over-the-counter ibuprofen  and Tylenol   Warm compresses over the affected area in 10 to 15-minute intervals  May follow-up with urgent care as needed   ED Prescriptions     Medication Sig Dispense Auth. Provider   cyclobenzaprine  (FLEXERIL ) 5 MG tablet Take 1 tablet (5 mg total) by mouth 3 (three) times daily as needed for muscle spasms. 20 tablet Darah Simkin R, NP      PDMP not reviewed this encounter.   Teresa Shelba SAUNDERS, NP 07/11/24 1212

## 2024-07-11 NOTE — Discharge Instructions (Addendum)
 Today you are evaluated for your pelvic and low back pain  Urinalysis is negative for bladder infection  Will most likely need additional workup to determine exact cause therefore please schedule follow-up appointment with gynecologist, information on front page  You have been given an injection of Toradol  in an attempt to help reduce discomfort, ideally will see improvement within 30 minutes  At home you may use muscle relaxant every 8 hours as needed, may take in addition to over-the-counter ibuprofen  and Tylenol   Warm compresses over the affected area in 10 to 15-minute intervals  May follow-up with urgent care as needed

## 2024-07-11 NOTE — ED Triage Notes (Addendum)
 Patient reports lower back pain and pelvic x 1 month. Patient states when her periods come on she gets this pain. Rates  taken pain 7/10. Patient has not taken anything for symptoms.

## 2024-10-25 ENCOUNTER — Other Ambulatory Visit: Payer: Self-pay | Admitting: Family

## 2024-10-25 DIAGNOSIS — N920 Excessive and frequent menstruation with regular cycle: Secondary | ICD-10-CM

## 2024-10-25 DIAGNOSIS — N946 Dysmenorrhea, unspecified: Secondary | ICD-10-CM

## 2024-10-25 DIAGNOSIS — N941 Unspecified dyspareunia: Secondary | ICD-10-CM
